# Patient Record
Sex: Female | Born: 1990 | Race: White | Hispanic: No | Marital: Single | Smoking: Never smoker
Health system: Southern US, Community
[De-identification: ages and names within clinical notes are randomized; demographics above are authoritative.]

## PROBLEM LIST (undated history)

## (undated) DIAGNOSIS — Z5189 Encounter for other specified aftercare: Secondary | ICD-10-CM

## (undated) DIAGNOSIS — D649 Anemia, unspecified: Secondary | ICD-10-CM

## (undated) DIAGNOSIS — K9041 Non-celiac gluten sensitivity: Secondary | ICD-10-CM

## (undated) DIAGNOSIS — E039 Hypothyroidism, unspecified: Secondary | ICD-10-CM

## (undated) DIAGNOSIS — N912 Amenorrhea, unspecified: Secondary | ICD-10-CM

## (undated) DIAGNOSIS — Z8659 Personal history of other mental and behavioral disorders: Secondary | ICD-10-CM

## (undated) DIAGNOSIS — Z862 Personal history of diseases of the blood and blood-forming organs and certain disorders involving the immune mechanism: Secondary | ICD-10-CM

## (undated) HISTORY — DX: Encounter for other specified aftercare: Z51.89

## (undated) HISTORY — DX: Amenorrhea, unspecified: N91.2

## (undated) HISTORY — DX: Personal history of diseases of the blood and blood-forming organs and certain disorders involving the immune mechanism: Z86.2

## (undated) HISTORY — DX: Hypothyroidism, unspecified: E03.9

## (undated) HISTORY — DX: Personal history of other mental and behavioral disorders: Z86.59

## (undated) HISTORY — PX: MECKEL DIVERTICULUM EXCISION: SHX314

---
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
## 2010-09-16 LAB — CBC
HCT: 41 % (ref 36.0–46.0)
MCV: 89.8 fL (ref 78.0–100.0)
MCV: 91.2 fL (ref 78.0–100.0)
Platelets: 293 10*3/uL (ref 150–400)
Platelets: 301 10*3/uL (ref 150–400)
Platelets: 374 10*3/uL (ref 150–400)
RDW: 12.6 % (ref 11.5–15.5)
WBC: 10.8 10*3/uL — ABNORMAL HIGH (ref 4.0–10.5)
WBC: 5.8 10*3/uL (ref 4.0–10.5)
WBC: 6.3 10*3/uL (ref 4.0–10.5)

## 2010-09-16 LAB — COMPREHENSIVE METABOLIC PANEL
AST: 18 U/L (ref 0–37)
Albumin: 4.6 g/dL (ref 3.5–5.2)
BUN: 5 mg/dL — ABNORMAL LOW (ref 6–23)
Calcium: 9.6 mg/dL (ref 8.4–10.5)
Creatinine, Ser: 0.67 mg/dL (ref 0.4–1.2)
GFR calc Af Amer: >60 mL/min (ref >60)
Total Protein: 7.5 g/dL (ref 6.0–8.3)

## 2010-09-16 LAB — BASIC METABOLIC PANEL
BUN: 3 mg/dL — ABNORMAL LOW (ref 6–23)
Creatinine, Ser: 0.56 mg/dL (ref 0.4–1.2)
GFR calc Af Amer: >60 mL/min (ref >60)
GFR calc non Af Amer: >60 mL/min (ref >60)
Potassium: 4.3 mEq/L (ref 3.5–5.1)

## 2010-09-16 LAB — DIFFERENTIAL
Basophils Absolute: 0.1 10*3/uL (ref 0.0–0.1)
Eosinophils Absolute: 0.1 10*3/uL (ref 0.0–0.7)
Eosinophils Relative: 0 % (ref 0–5)
Lymphocytes Relative: 15 % (ref 12–46)
Lymphs Abs: 1.7 10*3/uL (ref 0.7–4.0)
Lymphs Abs: 2.3 10*3/uL (ref 0.7–4.0)
Monocytes Absolute: 0.3 10*3/uL (ref 0.1–1.0)
Monocytes Relative: 3 % (ref 3–12)
Neutro Abs: 3.4 10*3/uL (ref 1.7–7.7)
Neutro Abs: 8.8 10*3/uL — ABNORMAL HIGH (ref 1.7–7.7)
Neutrophils Relative %: 54 % (ref 43–77)

## 2010-09-16 LAB — GC/CHLAMYDIA PROBE AMP, GENITAL
Chlamydia, DNA Probe: NEGATIVE
GC Probe Amp, Genital: NEGATIVE

## 2010-09-16 LAB — WET PREP, GENITAL

## 2010-09-16 LAB — URINALYSIS, ROUTINE W REFLEX MICROSCOPIC
Bilirubin Urine: NEGATIVE
Hgb urine dipstick: NEGATIVE
Nitrite: NEGATIVE
Protein, ur: NEGATIVE mg/dL
Specific Gravity, Urine: 1.012 (ref 1.005–1.030)
Urobilinogen, UA: 0.2 mg/dL (ref 0.0–1.0)

## 2010-09-16 LAB — PROTIME-INR
INR: 1.11 (ref 0.00–1.49)
Prothrombin Time: 14.2 seconds (ref 11.6–15.2)

## 2011-04-03 LAB — ETHANOL: Alcohol, Ethyl (B): 76 — ABNORMAL HIGH

## 2017-04-20 ENCOUNTER — Emergency Department (HOSPITAL_COMMUNITY): Payer: No Typology Code available for payment source

## 2017-04-20 ENCOUNTER — Encounter (HOSPITAL_COMMUNITY): Payer: Self-pay | Admitting: *Deleted

## 2017-04-20 ENCOUNTER — Inpatient Hospital Stay (HOSPITAL_COMMUNITY)
Admission: EM | Admit: 2017-04-20 | Discharge: 2017-04-22 | DRG: 442 | Disposition: A | Discharge status: Home / Self Care | Payer: No Typology Code available for payment source | Attending: General Surgery | Admitting: General Surgery

## 2017-04-20 ENCOUNTER — Observation Stay (HOSPITAL_COMMUNITY): Payer: No Typology Code available for payment source

## 2017-04-20 DIAGNOSIS — S36114A Minor laceration of liver, initial encounter: Principal | ICD-10-CM | POA: Diagnosis present

## 2017-04-20 DIAGNOSIS — S2232XA Fracture of one rib, left side, initial encounter for closed fracture: Secondary | ICD-10-CM | POA: Diagnosis present

## 2017-04-20 DIAGNOSIS — T07XXXA Unspecified multiple injuries, initial encounter: Secondary | ICD-10-CM

## 2017-04-20 DIAGNOSIS — K9041 Non-celiac gluten sensitivity: Secondary | ICD-10-CM | POA: Diagnosis present

## 2017-04-20 DIAGNOSIS — S2239XA Fracture of one rib, unspecified side, initial encounter for closed fracture: Secondary | ICD-10-CM

## 2017-04-20 DIAGNOSIS — R1030 Lower abdominal pain, unspecified: Secondary | ICD-10-CM | POA: Diagnosis not present

## 2017-04-20 DIAGNOSIS — S0181XA Laceration without foreign body of other part of head, initial encounter: Secondary | ICD-10-CM | POA: Diagnosis present

## 2017-04-20 DIAGNOSIS — Y9355 Activity, bike riding: Secondary | ICD-10-CM

## 2017-04-20 DIAGNOSIS — R52 Pain, unspecified: Secondary | ICD-10-CM

## 2017-04-20 DIAGNOSIS — S36113A Laceration of liver, unspecified degree, initial encounter: Secondary | ICD-10-CM

## 2017-04-20 DIAGNOSIS — S01311A Laceration without foreign body of right ear, initial encounter: Secondary | ICD-10-CM | POA: Diagnosis present

## 2017-04-20 HISTORY — DX: Non-celiac gluten sensitivity: K90.41

## 2017-04-20 LAB — COMPREHENSIVE METABOLIC PANEL
ALT: 220 U/L — AB (ref 14–54)
AST: 246 U/L — ABNORMAL HIGH (ref 15–41)
Albumin: 4.4 g/dL (ref 3.5–5.0)
Alkaline Phosphatase: 51 U/L (ref 38–126)
Anion gap: 13 (ref 5–15)
BUN: 11 mg/dL (ref 6–20)
CALCIUM: 9.2 mg/dL (ref 8.9–10.3)
CHLORIDE: 97 mmol/L — AB (ref 101–111)
CO2: 22 mmol/L (ref 22–32)
CREATININE: 0.97 mg/dL (ref 0.44–1.00)
Glucose, Bld: 167 mg/dL — ABNORMAL HIGH (ref 65–99)
Potassium: 3.6 mmol/L (ref 3.5–5.1)
SODIUM: 132 mmol/L — AB (ref 135–145)
Total Bilirubin: 0.6 mg/dL (ref 0.3–1.2)
Total Protein: 6.9 g/dL (ref 6.5–8.1)

## 2017-04-20 LAB — CBC
HCT: 32.3 % — ABNORMAL LOW (ref 36.0–46.0)
HEMOGLOBIN: 10.4 g/dL — AB (ref 12.0–15.0)
MCH: 25.2 pg — AB (ref 26.0–34.0)
MCHC: 32.2 g/dL (ref 30.0–36.0)
MCV: 78.2 fL (ref 78.0–100.0)
PLATELETS: 398 10*3/uL (ref 150–400)
RBC: 4.13 MIL/uL (ref 3.87–5.11)
RDW: 16.8 % — ABNORMAL HIGH (ref 11.5–15.5)
WBC: 12.6 10*3/uL — AB (ref 4.0–10.5)

## 2017-04-20 LAB — URINALYSIS, ROUTINE W REFLEX MICROSCOPIC
Bilirubin Urine: NEGATIVE
Glucose, UA: NEGATIVE mg/dL
Hgb urine dipstick: NEGATIVE
KETONES UR: 5 mg/dL — AB
LEUKOCYTES UA: NEGATIVE
NITRITE: NEGATIVE
PROTEIN: NEGATIVE mg/dL
Specific Gravity, Urine: 1.019 (ref 1.005–1.030)
pH: 6 (ref 5.0–8.0)

## 2017-04-20 LAB — I-STAT CHEM 8, ED
BUN: 13 mg/dL (ref 6–20)
CALCIUM ION: 1.09 mmol/L — AB (ref 1.15–1.40)
Chloride: 97 mmol/L — ABNORMAL LOW (ref 101–111)
Creatinine, Ser: 0.9 mg/dL (ref 0.44–1.00)
GLUCOSE: 170 mg/dL — AB (ref 65–99)
HCT: 32 % — ABNORMAL LOW (ref 36.0–46.0)
HEMOGLOBIN: 10.9 g/dL — AB (ref 12.0–15.0)
POTASSIUM: 3.6 mmol/L (ref 3.5–5.1)
Sodium: 134 mmol/L — ABNORMAL LOW (ref 135–145)
TCO2: 23 mmol/L (ref 22–32)

## 2017-04-20 LAB — I-STAT BETA HCG BLOOD, ED (MC, WL, AP ONLY): I-stat hCG, quantitative: <5 m[IU]/mL (ref <5)

## 2017-04-20 LAB — I-STAT CG4 LACTIC ACID, ED: LACTIC ACID, VENOUS: 3.42 mmol/L — AB (ref 0.5–1.9)

## 2017-04-20 LAB — TYPE AND SCREEN
ABO/RH(D): A NEG
ANTIBODY SCREEN: NEGATIVE

## 2017-04-20 LAB — SAMPLE TO BLOOD BANK

## 2017-04-20 LAB — PROTIME-INR
INR: 1.08
PROTHROMBIN TIME: 13.9 s (ref 11.4–15.2)

## 2017-04-20 LAB — ETHANOL

## 2017-04-20 LAB — ABO/RH: ABO/RH(D): A NEG

## 2017-04-20 MED ORDER — ONDANSETRON HCL 4 MG/2ML IJ SOLN: 4.0000 mg | Freq: Four times a day (QID) | INTRAMUSCULAR | Status: DC | PRN

## 2017-04-20 MED ORDER — IOPAMIDOL (ISOVUE-300) INJECTION 61%
INTRAVENOUS | Status: AC
  Administered 2017-04-20: 100 mL
  Filled 2017-04-20: qty 100

## 2017-04-20 MED ORDER — LIDOCAINE-EPINEPHRINE (PF) 2 %-1:200000 IJ SOLN
20.0000 mL | Freq: Once | INTRAMUSCULAR | Status: AC
  Administered 2017-04-20: 20 mL via INTRADERMAL
  Filled 2017-04-20: qty 20

## 2017-04-20 MED ORDER — ONDANSETRON HCL 4 MG/2ML IJ SOLN
4.0000 mg | Freq: Once | INTRAMUSCULAR | Status: AC
  Administered 2017-04-20: 4 mg via INTRAVENOUS
  Filled 2017-04-20: qty 2

## 2017-04-20 MED ORDER — ONDANSETRON 4 MG PO TBDP: 4.0000 mg | ORAL_TABLET | Freq: Four times a day (QID) | ORAL | Status: DC | PRN

## 2017-04-20 MED ORDER — HYDROMORPHONE HCL 1 MG/ML IJ SOLN
1.0000 mg | INTRAMUSCULAR | Status: DC | PRN
  Administered 2017-04-20 – 2017-04-21 (×3): 1 mg via INTRAVENOUS
  Filled 2017-04-20 (×3): qty 1

## 2017-04-20 MED ORDER — LIDOCAINE HCL 2 % EX GEL
1.0000 "application " | Freq: Once | CUTANEOUS | Status: DC
  Filled 2017-04-20: qty 20

## 2017-04-20 MED ORDER — SODIUM CHLORIDE 0.9 % IV SOLN
INTRAVENOUS | Status: DC
  Administered 2017-04-20: 125 mL/h via INTRAVENOUS

## 2017-04-20 MED ORDER — DEXTROSE-NACL 5-0.9 % IV SOLN
INTRAVENOUS | Status: DC
  Administered 2017-04-21: 01:00:00 via INTRAVENOUS

## 2017-04-20 MED ORDER — FENTANYL CITRATE (PF) 100 MCG/2ML IJ SOLN
INTRAMUSCULAR | Status: AC | PRN
  Administered 2017-04-20: 50 ug via INTRAVENOUS

## 2017-04-20 MED ORDER — TETANUS-DIPHTH-ACELL PERTUSSIS 5-2.5-18.5 LF-MCG/0.5 IM SUSP
0.5000 mL | Freq: Once | INTRAMUSCULAR | Status: AC
  Administered 2017-04-20: 0.5 mL via INTRAMUSCULAR
  Filled 2017-04-20: qty 0.5

## 2017-04-20 MED ORDER — ENOXAPARIN SODIUM 40 MG/0.4ML ~~LOC~~ SOLN
40.0000 mg | SUBCUTANEOUS | Status: DC
  Administered 2017-04-20 – 2017-04-21 (×2): 40 mg via SUBCUTANEOUS
  Filled 2017-04-20 (×2): qty 0.4

## 2017-04-20 MED ORDER — CEFAZOLIN SODIUM-DEXTROSE 1-4 GM/50ML-% IV SOLN
1.0000 g | Freq: Once | INTRAVENOUS | Status: AC
  Administered 2017-04-20: 1 g via INTRAVENOUS
  Filled 2017-04-20: qty 50

## 2017-04-20 MED ORDER — FENTANYL CITRATE (PF) 100 MCG/2ML IJ SOLN
INTRAMUSCULAR | Status: AC
  Filled 2017-04-20: qty 2

## 2017-04-20 MED ORDER — SODIUM CHLORIDE 0.9 % IV BOLUS (SEPSIS)
1000.0000 mL | Freq: Once | INTRAVENOUS | Status: AC
  Administered 2017-04-20: 1000 mL via INTRAVENOUS

## 2017-04-20 MED ORDER — SODIUM CHLORIDE 0.9 % IV SOLN
INTRAVENOUS | Status: AC | PRN
  Administered 2017-04-20: 1000 mL via INTRAVENOUS

## 2017-04-20 MED ORDER — BACITRACIN ZINC 500 UNIT/GM EX OINT
1.0000 "application " | TOPICAL_OINTMENT | Freq: Two times a day (BID) | CUTANEOUS | Status: DC
  Administered 2017-04-20 – 2017-04-21 (×3): 1 via TOPICAL
  Filled 2017-04-20 (×2): qty 28.35

## 2017-04-20 NOTE — Progress Notes (Signed)
Received patient from ED accompanied by father. Patient AOx4, VS stable with slightly low BP at 108/56, O2Sat at 96% on RA, pain at 4/10, noted multiple abrasion on bilateral arms, knees and head.  Applied ice pack on right head and right hand, cleanse abrasion and applied bacitracin per order.  Patient on continuous pulse oximetry and telemetry.  Patient oriented to room, bed controls and call light.  [facility] mother arrived. Patient now resting on bed with both eyes close. Mom is at bedside and will be spending the night with the patient.  Will continue to monitor.
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
## 2017-04-20 NOTE — ED Notes (Signed)
Wounds debrieded  by Dr Lazarus SalinesWolicki, bacitracin placed and abrasions left open to air.
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
## 2017-04-20 NOTE — ED Notes (Signed)
Dr. Ramirez at bedside  
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
## 2017-04-20 NOTE — Progress Notes (Signed)
Orthopedic Tech Progress Note Patient Details:  Derry SkillHilary Redfield [birth year] [id number]  Patient ID: Derry SkillHilary Bossier, female   DOB: [birth year], 26 y.o.   MRN: [id number]   Nikki DomCrawford, Ensley Blas 04/20/2017, 12:53 PM Made level 2 trauma visit
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
## 2017-04-20 NOTE — ED Notes (Signed)
Dr.Wolicki at bedside. 

## 2017-04-21 DIAGNOSIS — R1030 Lower abdominal pain, unspecified: Secondary | ICD-10-CM | POA: Diagnosis present

## 2017-04-21 DIAGNOSIS — S36114A Minor laceration of liver, initial encounter: Secondary | ICD-10-CM | POA: Diagnosis present

## 2017-04-21 DIAGNOSIS — S01311A Laceration without foreign body of right ear, initial encounter: Secondary | ICD-10-CM | POA: Diagnosis present

## 2017-04-21 DIAGNOSIS — S2232XA Fracture of one rib, left side, initial encounter for closed fracture: Secondary | ICD-10-CM | POA: Diagnosis present

## 2017-04-21 DIAGNOSIS — K9041 Non-celiac gluten sensitivity: Secondary | ICD-10-CM | POA: Diagnosis present

## 2017-04-21 DIAGNOSIS — Y9355 Activity, bike riding: Secondary | ICD-10-CM | POA: Diagnosis not present

## 2017-04-21 DIAGNOSIS — S0181XA Laceration without foreign body of other part of head, initial encounter: Secondary | ICD-10-CM | POA: Diagnosis present

## 2017-04-21 LAB — CBC
HEMATOCRIT: 29.3 % — AB (ref 36.0–46.0)
Hemoglobin: 9.4 g/dL — ABNORMAL LOW (ref 12.0–15.0)
MCH: 25.6 pg — AB (ref 26.0–34.0)
MCHC: 32.1 g/dL (ref 30.0–36.0)
MCV: 79.8 fL (ref 78.0–100.0)
PLATELETS: 290 10*3/uL (ref 150–400)
RBC: 3.67 MIL/uL — ABNORMAL LOW (ref 3.87–5.11)
RDW: 17.7 % — AB (ref 11.5–15.5)
WBC: 7.3 10*3/uL (ref 4.0–10.5)

## 2017-04-21 MED ORDER — ACETAMINOPHEN 325 MG PO TABS: 650.0000 mg | ORAL_TABLET | Freq: Four times a day (QID) | ORAL | Status: DC | PRN

## 2017-04-21 MED ORDER — OXYCODONE HCL 5 MG PO TABS
5.0000 mg | ORAL_TABLET | Freq: Four times a day (QID) | ORAL | Status: DC | PRN
  Administered 2017-04-21: 5 mg via ORAL
  Filled 2017-04-21: qty 1
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
## 2017-04-22 LAB — CBC
HCT: 29.4 % — ABNORMAL LOW (ref 36.0–46.0)
Hemoglobin: 9.3 g/dL — ABNORMAL LOW (ref 12.0–15.0)
MCH: 25.5 pg — ABNORMAL LOW (ref 26.0–34.0)
MCHC: 31.6 g/dL (ref 30.0–36.0)
MCV: 80.5 fL (ref 78.0–100.0)
PLATELETS: 267 10*3/uL (ref 150–400)
RBC: 3.65 MIL/uL — ABNORMAL LOW (ref 3.87–5.11)
RDW: 17.4 % — AB (ref 11.5–15.5)
WBC: 6.2 10*3/uL (ref 4.0–10.5)

## 2017-04-22 MED ORDER — OXYCODONE HCL 5 MG PO TABS: 5.0000 mg | ORAL_TABLET | Freq: Four times a day (QID) | ORAL | 0 refills | Status: DC | PRN

## 2017-04-22 MED ORDER — BACITRACIN ZINC 500 UNIT/GM EX OINT: 1.0000 "application " | TOPICAL_OINTMENT | Freq: Two times a day (BID) | CUTANEOUS | 0 refills | Status: DC
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
## 2017-04-22 NOTE — Discharge Instructions (Signed)
OK to shower  Clean wounds twice daily (shower counts as one) with Q-tip and water, then apply a thin coat of antibiotic ointment to any area which is scabby or crusty. Do not get fresh scars sunburned for 6 mos. OK to apply Mederma over the counter or Vitamin E oil to healed scars to help with scarring Sleep elevated 3-4 nights Ice pack as desired x 24 hrs.   Recheck my office 8 days for suture removal [phone] Call for signs of infection, same number.    Liver Laceration A liver laceration is a tear or a cut in the liver. The liver is an organ that is involved in many important bodily functions. Sometimes, a liver laceration can be a very serious injury. It can cause a lot of bleeding, and surgery may be needed. Other times, a liver laceration may be minor, and bed rest may be all that is needed. Either way, treatment in a hospital is almost always required. Liver lacerations are categorized in grades from 1 to 5. Low numbers identify lacerations that are less severe than lacerations with high numbers.  Grade 1: This is a tear in the outer lining of the liver. It is less than  inch (1 cm) deep.  Grade 2: This is a tear that is about  inch to 1 inch (1 to 3 cm) deep. It is less than 4 inches (10 cm) long.  Grade 3: This is a tear that is slightly more than 1 inch (3 cm) deep.  Grades 4 and 5: These lacerations are very deep. They affect a large part of the liver.  What are the causes? This condition may be caused by:  A forceful hit to the area around the liver (blunt trauma), such as in a car crash. Blunt trauma can tear the liver even though it does not break the skin.  An injury in which an object goes through the skin and into the liver (penetrating injury), such as a stab or gunshot wound.  What are the signs or symptoms? Common symptoms of this condition include:  A swollen and firm abdomen.  Pain in the abdomen.  Tenderness when pressing on the right side of the  abdomen.  Other symptoms include:  Bleeding from a penetrating wound.  Bruises on the abdomen.  A fast heartbeat.  Taking quick breaths.  Feeling weak and dizzy.  How is this diagnosed? To diagnose this condition, your health care provider will do a physical exam and ask about any injuries to the right side of your abdomen. You may have various tests, such as:  Blood tests. Your blood may be tested every few hours. This will show whether you are losing blood.  CT scan. This test is done to check for laceration or bleeding.  Laparoscopy. This involves placing a small camera into the abdomen and looking directly at the surface of the liver.  How is this treated? Treatment depends on how deep the laceration is and how much bleeding you have. Treatment options include:  Monitoring and bed rest at the hospital. You will have tests often.  Receiving donated blood through an IV tube (transfusion) to replace blood that you have lost. You may need several transfusions.  Surgery to pack gauze pads or special material around the laceration to help it heal or to repair the laceration.  Follow these instructions at home:  Take over-the-counter and prescription medicines only as told by your health care provider. Do not take any other medicines  unless you ask your health care provider about them first.  Do not drive or use heavy machinery while taking prescription pain medicines.  Rest and limit your activity as told by your health care provider. It may be several months before you can return to your usual routine. Do not participate in activities that involve physical contact or require extra energy until your health care provider approves.  Keep all follow-up visits as told by your health care provider. This is important. Contact a health care provider if:  Your abdominal pain does not go away.  You feel more weak and tired than usual. Get help right away if:  Your abdominal pain  gets worse.  You have a cut on your skin that: ? Has more redness, swelling, or pain around it. ? Has more fluid or blood coming from it. ? Feels warm to the touch. ? Has pus or a bad smell coming from it.  You feel dizzy or very weak.  You have trouble breathing.  You have a fever. This information is not intended to replace advice given to you by your health care provider. Make sure you discuss any questions you have with your health care provider. Document Released: 07/13/2010 Document Revised: 01/26/2016 Document Reviewed: 01/26/2016 Elsevier Interactive Patient Education  2018 [employer]    Rib Fracture A rib fracture is a break or crack in one of the bones of the ribs. The ribs are a group of long, curved bones that wrap around your chest and attach to your spine. They protect your lungs and other organs in the chest cavity. A broken or cracked rib is often painful, but most do not cause other problems. Most rib fractures heal on their own over time. However, rib fractures can be more serious if multiple ribs are broken or if broken ribs move out of place and push against other structures. What are the causes?  A direct blow to the chest. For example, this could happen during contact sports, a car accident, or a fall against a hard object.  Repetitive movements with high force, such as pitching a baseball or having severe coughing spells. What are the signs or symptoms?  Pain when you breathe in or cough.  Pain when someone presses on the injured area. How is this diagnosed? Your caregiver will perform a physical exam. Various imaging tests may be ordered to confirm the diagnosis and to look for related injuries. These tests may include a chest X-ray, computed tomography (CT), magnetic resonance imaging (MRI), or a bone scan. How is this treated? Rib fractures usually heal on their own in 1-3 months. The longer healing period is often associated with a continued cough or  other aggravating activities. During the healing period, pain control is very important. Medication is usually given to control pain. Hospitalization or surgery may be needed for more severe injuries, such as those in which multiple ribs are broken or the ribs have moved out of place. Follow these instructions at home:  Avoid strenuous activity and any activities or movements that cause pain. Be careful during activities and avoid bumping the injured rib.  Gradually increase activity as directed by your caregiver.  Only take over-the-counter or prescription medications as directed by your caregiver. Do not take other medications without asking your caregiver first.  Apply ice to the injured area for the first 1-2 days after you have been treated or as directed by your caregiver. Applying ice helps to reduce inflammation and pain. ?  Put ice in a plastic bag. ? Place a towel between your skin and the bag. ? Leave the ice on for 15-20 minutes at a time, every 2 hours while you are awake.  Perform deep breathing as directed by your caregiver. This will help prevent pneumonia, which is a common complication of a broken rib. Your caregiver may instruct you to: ? Take deep breaths several times a day. ? Try to cough several times a day, holding a pillow against the injured area. ? Use a device called an incentive spirometer to practice deep breathing several times a day.  Drink enough fluids to keep your urine clear or pale yellow. This will help you avoid constipation.  Do not wear a rib belt or binder. These restrict breathing, which can lead to pneumonia. Get help right away if:  You have a fever.  You have difficulty breathing or shortness of breath.  You develop a continual cough, or you cough up thick or bloody sputum.  You feel sick to your stomach (nausea), throw up (vomit), or have abdominal pain.  You have worsening pain not controlled with medications. This information is not  intended to replace advice given to you by your health care provider. Make sure you discuss any questions you have with your health care provider. Document Released: 06/10/2005 Document Revised: 11/16/2015 Document Reviewed: 08/12/2012 [employer] Interactive Patient Education  [employer].
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
## 2017-07-16 DIAGNOSIS — F4323 Adjustment disorder with mixed anxiety and depressed mood: Secondary | ICD-10-CM | POA: Diagnosis not present

## 2017-08-04 DIAGNOSIS — F4323 Adjustment disorder with mixed anxiety and depressed mood: Secondary | ICD-10-CM | POA: Diagnosis not present

## 2017-08-27 DIAGNOSIS — F4323 Adjustment disorder with mixed anxiety and depressed mood: Secondary | ICD-10-CM | POA: Diagnosis not present

## 2017-11-11 ENCOUNTER — Encounter: Payer: Self-pay | Admitting: Obstetrics and Gynecology

## 2017-12-22 ENCOUNTER — Other Ambulatory Visit: Payer: Self-pay

## 2017-12-22 ENCOUNTER — Encounter: Payer: Self-pay | Admitting: Obstetrics and Gynecology

## 2017-12-22 ENCOUNTER — Ambulatory Visit: Payer: BLUE CROSS/BLUE SHIELD | Admitting: Obstetrics and Gynecology

## 2017-12-22 ENCOUNTER — Other Ambulatory Visit (HOSPITAL_COMMUNITY)
Admission: RE | Admit: 2017-12-22 | Discharge: 2017-12-22 | Disposition: A | Discharge status: Home / Self Care | Payer: BLUE CROSS/BLUE SHIELD | Source: Ambulatory Visit | Attending: Obstetrics and Gynecology | Admitting: Obstetrics and Gynecology

## 2017-12-22 VITALS — BP 118/72 | HR 60 | Resp 16 | Ht 65.0 in | Wt 124.2 lb

## 2017-12-22 DIAGNOSIS — E559 Vitamin D deficiency, unspecified: Secondary | ICD-10-CM

## 2017-12-22 DIAGNOSIS — Z862 Personal history of diseases of the blood and blood-forming organs and certain disorders involving the immune mechanism: Secondary | ICD-10-CM

## 2017-12-22 DIAGNOSIS — Z0000 Encounter for general adult medical examination without abnormal findings: Secondary | ICD-10-CM

## 2017-12-22 DIAGNOSIS — Z124 Encounter for screening for malignant neoplasm of cervix: Secondary | ICD-10-CM | POA: Insufficient documentation

## 2017-12-22 DIAGNOSIS — Z01419 Encounter for gynecological examination (general) (routine) without abnormal findings: Secondary | ICD-10-CM | POA: Diagnosis not present

## 2017-12-22 DIAGNOSIS — N911 Secondary amenorrhea: Secondary | ICD-10-CM

## 2017-12-22 MED ORDER — MEDROXYPROGESTERONE ACETATE 5 MG PO TABS: 5.0000 mg | ORAL_TABLET | Freq: Every day | ORAL | 0 refills | Status: DC

## 2017-12-22 NOTE — Progress Notes (Signed)
[age] y.o. No obstetric history on file. SingleCaucasianF here for annual exam.   Menarche at 17-18, she had regular cycles for a few years. Stopped being regular a couple of years ago. Started skipping her cycles, progressively got further apart. No cycle in the last year. Not sexually active in 2 years.  She has lost 20 lbs in the last few years, the irregular cycles started prior to that. She is exercising 4-5 x a week, 30-60 minutes. Max of running 5 miles. No galactorrhea. No headaches, no visual changes. Skin is always dry, no other thyroid complaints. No h/o anorexia or bulimia.  No hot flashes or night sweats. Some vaginal dryness.  LMP: patient has not had a menses in 1 year. Previously has taken progesterone with no menses following. Not sexually active in 16 months.      She was hospitalized for a GI bleed around 2016, was from a severe gluten allergy.   Sexually active: No.  The current method of family planning is none.    Exercising: Yes.    running, weight lifting Smoker:  no  Health Maintenance: Pap:  Last pap per patient 2017 WNL per patient History of abnormal Pap:  no TDaP: 04/2017 per patient Gardasil: No, discussed.    reports that she has never smoked. She has never used smokeless tobacco. She reports that she does not drink alcohol or use drugs. She works at a coffee shop.   Past Medical History:  Diagnosis Date  . Amenorrhea   . Blood transfusion without reported diagnosis   . Gluten intolerance   . History of anemia    resolved with dietary changes    Past Surgical History:  Procedure Laterality Date  . MECKEL DIVERTICULUM EXCISION  at 19   started as laparoscopy, ended in laparotomy    No current outpatient medications on file.   No current facility-administered medications for this visit.     Family History  Problem Relation Age of Onset  . Breast cancer Paternal Grandmother   . Ovarian cancer Maternal Grandmother     Review of Systems   Constitutional: Negative.   HENT: Negative.   Eyes: Negative.   Respiratory: Negative.   Cardiovascular: Negative.   Gastrointestinal: Negative.   Endocrine: Negative.   Genitourinary:       Amenorrhea x 1 year  Musculoskeletal: Negative.   Skin: Negative.   Allergic/Immunologic: Negative.   Neurological: Negative.   Hematological: Negative.   Psychiatric/Behavioral: Negative.     Exam:   BP 118/72 (BP Location: Right Arm, Patient Position: Sitting)   Pulse 60   Resp 16   Ht 5\' 5"  (1.651 m)   Wt 124 lb 3.2 oz (56.3 kg)   BMI 20.67 kg/m   Weight change: @WEIGHTCHANGE @ Height:   Height: 5\' 5"  (165.1 cm)  Ht Readings from Last 3 Encounters:  12/22/17 5\' 5"  (1.651 m)  04/20/17 5\' 6"  (1.676 m)    General appearance: alert, cooperative and appears stated age. Pale Head: Normocephalic, without obvious abnormality, atraumatic Neck: no adenopathy, supple, symmetrical, trachea midline and thyroid normal to inspection and palpation Lungs: clear to auscultation bilaterally Cardiovascular: regular rate and rhythm Breasts: normal appearance, no masses or tenderness Abdomen: soft, non-tender; non distended,  no masses,  no organomegaly Extremities: extremities normal, atraumatic, no cyanosis or edema Skin: Skin color, texture, turgor normal. No rashes or lesions Lymph nodes: Cervical, supraclavicular, and axillary nodes normal. No abnormal inguinal nodes palpated Neurologic: Grossly normal   Pelvic: External genitalia:  no lesions              Urethra:  normal appearing urethra with no masses, tenderness or lesions              Bartholins and Skenes: normal                 Vagina: normal appearing vagina with normal color and discharge, no lesions              Cervix: no cervical motion tenderness and no lesions               Bimanual Exam:  Uterus:  normal size, contour, position, consistency, mobility, non-tender              Adnexa: no mass, fullness, tenderness                Rectovaginal: Confirms               Anus:  normal sphincter tone, no lesions  Chaperone was present for exam.  A:  Well Woman with normal exam  Secondary amenorrhea, not sexually active.  Dietary restrictions  H/O anemia    P:   Pap with reflex hpv  Declines STD testing  Provera now, call with or without a cycle  Screening labs  TSH, prolactin, FSH, estradiol  CBC, ferritin, B12, folate  Vit D   If still anemic will refer to hematology

## 2017-12-22 NOTE — Patient Instructions (Signed)
Take the provera for 5 days. Please call the office with or without a cycle (can take up to 2 weeks).  EXERCISE AND DIET:  We recommended that you start or continue a regular exercise program for good health. Regular exercise means any activity that makes your heart beat faster and makes you sweat.  We recommend exercising at least 30 minutes per day at least 3 days a week, preferably 4 or 5.  We also recommend a diet low in fat and sugar.  Inactivity, poor dietary choices and obesity can cause diabetes, heart attack, stroke, and kidney damage, among others.    ALCOHOL AND SMOKING:  Women should limit their alcohol intake to no more than 7 drinks/beers/glasses of wine (combined, not each!) per week. Moderation of alcohol intake to this level decreases your risk of breast cancer and liver damage. And of course, no recreational drugs are part of a healthy lifestyle.  And absolutely no smoking or even second hand smoke. Most people know smoking can cause heart and lung diseases, but did you know it also contributes to weakening of your bones? Aging of your skin?  Yellowing of your teeth and nails?  CALCIUM AND VITAMIN D:  Adequate intake of calcium and Vitamin D are recommended.  The recommendations for exact amounts of these supplements seem to change often, but generally speaking 600 mg of calcium (either carbonate or citrate) and 800 units of Vitamin D per day seems prudent. Certain women may benefit from higher intake of Vitamin D.  If you are among these women, your doctor will have told you during your visit.    PAP SMEARS:  Pap smears, to check for cervical cancer or precancers,  have traditionally been done yearly, although recent scientific advances have shown that most women can have pap smears less often.  However, every woman still should have a physical exam from her gynecologist every year. It will include a breast check, inspection of the vulva and vagina to check for abnormal growths or skin  changes, a visual exam of the cervix, and then an exam to evaluate the size and shape of the uterus and ovaries.  And after 27 years of age, a rectal exam is indicated to check for rectal cancers. We will also provide age appropriate advice regarding health maintenance, like when you should have certain vaccines, screening for sexually transmitted diseases, bone density testing, colonoscopy, mammograms, etc.   MAMMOGRAMS:  All women over 54 years old should have a yearly mammogram. Many facilities now offer a "3D" mammogram, which may cost around $50 extra out of pocket. If possible,  we recommend you accept the option to have the 3D mammogram performed.  It both reduces the number of women who will be called back for extra views which then turn out to be normal, and it is better than the routine mammogram at detecting truly abnormal areas.    COLONOSCOPY:  Colonoscopy to screen for colon cancer is recommended for all women at age 45.  We know, you hate the idea of the prep.  We agree, BUT, having colon cancer and not knowing it is worse!!  Colon cancer so often starts as a polyp that can be seen and removed at colonscopy, which can quite literally save your life!  And if your first colonoscopy is normal and you have no family history of colon cancer, most women don't have to have it again for 10 years.  Once every ten years, you can do something that  may end up saving your life, right?  We will be happy to help you get it scheduled when you are ready.  Be sure to check your insurance coverage so you understand how much it will cost.  It may be covered as a preventative service at no cost, but you should check your particular policy.      Breast Self-Awareness Breast self-awareness means being familiar with how your breasts look and feel. It involves checking your breasts regularly and reporting any changes to your health care provider. Practicing breast self-awareness is important. A change in your  breasts can be a sign of a serious medical problem. Being familiar with how your breasts look and feel allows you to find any problems early, when treatment is more likely to be successful. All women should practice breast self-awareness, including women who have had breast implants. How to do a breast self-exam One way to learn what is normal for your breasts and whether your breasts are changing is to do a breast self-exam. To do a breast self-exam: Look for Changes  1. Remove all the clothing above your waist. 2. Stand in front of a mirror in a room with good lighting. 3. Put your hands on your hips. 4. Push your hands firmly downward. 5. Compare your breasts in the mirror. Look for differences between them (asymmetry), such as: ? Differences in shape. ? Differences in size. ? Puckers, dips, and bumps in one breast and not the other. 6. Look at each breast for changes in your skin, such as: ? Redness. ? Scaly areas. 7. Look for changes in your nipples, such as: ? Discharge. ? Bleeding. ? Dimpling. ? Redness. ? A change in position. Feel for Changes  Carefully feel your breasts for lumps and changes. It is best to do this while lying on your back on the floor and again while sitting or standing in the shower or tub with soapy water on your skin. Feel each breast in the following way:  Place the arm on the side of the breast you are examining above your head.  Feel your breast with the other hand.  Start in the nipple area and make  inch (2 cm) overlapping circles to feel your breast. Use the pads of your three middle fingers to do this. Apply light pressure, then medium pressure, then firm pressure. The light pressure will allow you to feel the tissue closest to the skin. The medium pressure will allow you to feel the tissue that is a little deeper. The firm pressure will allow you to feel the tissue close to the ribs.  Continue the overlapping circles, moving downward over the  breast until you feel your ribs below your breast.  Move one finger-width toward the center of the body. Continue to use the  inch (2 cm) overlapping circles to feel your breast as you move slowly up toward your collarbone.  Continue the up and down exam using all three pressures until you reach your armpit.  Write Down What You Find  Write down what is normal for each breast and any changes that you find. Keep a written record with breast changes or normal findings for each breast. By writing this information down, you do not need to depend only on memory for size, tenderness, or location. Write down where you are in your menstrual cycle, if you are still menstruating. If you are having trouble noticing differences in your breasts, do not get discouraged. With time you will  become more familiar with the variations in your breasts and more comfortable with the exam. How often should I examine my breasts? Examine your breasts every month. If you are breastfeeding, the best time to examine your breasts is after a feeding or after using a breast pump. If you menstruate, the best time to examine your breasts is 5-7 days after your period is over. During your period, your breasts are lumpier, and it may be more difficult to notice changes. When should I see my health care provider? See your health care provider if you notice:  A change in shape or size of your breasts or nipples.  A change in the skin of your breast or nipples, such as a reddened or scaly area.  Unusual discharge from your nipples.  A lump or thick area that was not there before.  Pain in your breasts.  Anything that concerns you.  This information is not intended to replace advice given to you by your health care provider. Make sure you discuss any questions you have with your health care provider. Document Released: 06/10/2005 Document Revised: 11/16/2015 Document Reviewed: 04/30/2015 Elsevier Interactive Patient Education   [employer]2018 [employer]

## 2017-12-23 LAB — COMPREHENSIVE METABOLIC PANEL
ALK PHOS: 37 IU/L — AB (ref 39–117)
ALT: 14 IU/L (ref 0–32)
AST: 18 IU/L (ref 0–40)
Albumin/Globulin Ratio: 2.8 — ABNORMAL HIGH (ref 1.2–2.2)
Albumin: 5 g/dL (ref 3.5–5.5)
BILIRUBIN TOTAL: 0.2 mg/dL (ref 0.0–1.2)
BUN / CREAT RATIO: 13 (ref 9–23)
BUN: 9 mg/dL (ref 6–20)
CHLORIDE: 90 mmol/L — AB (ref 96–106)
CO2: 23 mmol/L (ref 20–29)
CREATININE: 0.71 mg/dL (ref 0.57–1.00)
Calcium: 9.3 mg/dL (ref 8.7–10.2)
GFR calc Af Amer: 135 mL/min/{1.73_m2} (ref >59)
GFR calc non Af Amer: 117 mL/min/{1.73_m2} (ref >59)
Globulin, Total: 1.8 g/dL (ref 1.5–4.5)
Glucose: 86 mg/dL (ref 65–99)
Potassium: 4.1 mmol/L (ref 3.5–5.2)
Sodium: 127 mmol/L — ABNORMAL LOW (ref 134–144)
Total Protein: 6.8 g/dL (ref 6.0–8.5)

## 2017-12-23 LAB — PROLACTIN: PROLACTIN: 16.4 ng/mL (ref 4.8–23.3)

## 2017-12-23 LAB — CBC
Hematocrit: 31.8 % — ABNORMAL LOW (ref 34.0–46.6)
Hemoglobin: 11 g/dL — ABNORMAL LOW (ref 11.1–15.9)
MCH: 30.7 pg (ref 26.6–33.0)
MCHC: 34.6 g/dL (ref 31.5–35.7)
MCV: 89 fL (ref 79–97)
PLATELETS: 318 10*3/uL (ref 150–450)
RBC: 3.58 x10E6/uL — ABNORMAL LOW (ref 3.77–5.28)
RDW: 13.6 % (ref 12.3–15.4)
WBC: 6.1 10*3/uL (ref 3.4–10.8)

## 2017-12-23 LAB — FERRITIN: Ferritin: 21 ng/mL (ref 15–150)

## 2017-12-23 LAB — CYTOLOGY - PAP: DIAGNOSIS: NEGATIVE

## 2017-12-23 LAB — ESTRADIOL: Estradiol: <5 pg/mL

## 2017-12-23 LAB — VITAMIN D 25 HYDROXY (VIT D DEFICIENCY, FRACTURES): VIT D 25 HYDROXY: 15 ng/mL — AB (ref 30.0–100.0)

## 2017-12-23 LAB — FOLLICLE STIMULATING HORMONE: FSH: 4.1 m[IU]/mL

## 2017-12-23 LAB — B12 AND FOLATE PANEL
FOLATE: 12.5 ng/mL (ref >3.0)
VITAMIN B 12: 522 pg/mL (ref 232–1245)

## 2017-12-23 LAB — LIPID PANEL
CHOLESTEROL TOTAL: 201 mg/dL — AB (ref 100–199)
Chol/HDL Ratio: 2.3 ratio (ref 0.0–4.4)
HDL: 88 mg/dL (ref >39)
LDL Calculated: 104 mg/dL — ABNORMAL HIGH (ref 0–99)
TRIGLYCERIDES: 45 mg/dL (ref 0–149)
VLDL CHOLESTEROL CAL: 9 mg/dL (ref 5–40)

## 2017-12-23 LAB — TSH: TSH: 6.27 u[IU]/mL — ABNORMAL HIGH (ref 0.450–4.500)

## 2017-12-26 ENCOUNTER — Telehealth: Payer: Self-pay

## 2017-12-26 DIAGNOSIS — R7989 Other specified abnormal findings of blood chemistry: Secondary | ICD-10-CM

## 2017-12-26 NOTE — Telephone Encounter (Signed)
-----   Message from Romualdo BolkJill Evelyn Jertson, MD sent at 12/24/2017  5:33 PM EDT ----- Please inform the patient that her vit d is low and have her start taking 2,000 IU of vit D3 daily. She should have a f/u vit D level in 3 months. Her TSH is elevated, will add on a free T3 and FT4 and make recommendations after those labs have returned. 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  Her hormone levels (FSH and estradiol) are low. This is most c/w hypothalamic amenorrhea She is very mildly anemic with low normal iron stores, normal B12 and Folate. I would recommend that she take one iron tablet a day, slow fe or ferrex 150 mg are options. Please check another CBC and ferritin with her f/u vit d in 3 months Her sodium is low, I don't know why. It is likely a chronic issue. Symptoms can include headache, fatigue, nausea, confusion. See if she is having any of these symptoms. She needs to see a primary MD to manage and follow her low sodium, please try and get her an appointment in the next week.  If her FT4 and FT3 are normal, she should have a brain MRI to rule out a tumor of the hypothalamus and pituitary secondary to her low FSH, estradiol and low sodium, If the f/u thyroid testing is normal please order a MRI of the [place], with and without contrast.  02 recall for her pap

## 2017-12-26 NOTE — Telephone Encounter (Signed)
Left message to call Kaitlyn at [phone]. 

## 2017-12-26 NOTE — Telephone Encounter (Signed)
Return call from patient. Advised of results and instructions thus far.  Discussed Vit D3 and OTC iron supplement ( to take with with vit c instead of calcium.) Patient is at work and is unable to schedule follow-up lab appointment at this time. Will await remaining labs and next step instructions.

## 2017-12-29 NOTE — Telephone Encounter (Signed)
Spoke with patient. Review results again. Patient wants to let Dr.Jertson know [facility]. Per review of chart this was a CT scan not an MRI. Advised patient once additional testing results have returned we will return call with additional recommendations. Patient is agreeable.

## 2017-12-30 NOTE — Telephone Encounter (Signed)
-----   Message from Romualdo BolkJill Evelyn Jertson, MD sent at 12/29/2017  5:06 PM EDT ----- Please inform the patient that her FT3 is slightly low and refer her to see Endocrinology. I think she should see Endocrinology prior to a MRI, hypothyroidism can cause amenorrhea (although hers is very mild, lets get the Endocrinologists opinion)

## 2017-12-30 NOTE — Telephone Encounter (Signed)
Spoke with patient. Results given. Patient verbalizes understanding. Referral placed to [facility]. Patient is aware she will be contacted to schedule an appointment directly. Encounter closed.

## 2017-12-30 NOTE — Telephone Encounter (Signed)
Left message to call Kaitlyn at [phone]. 

## 2018-01-06 ENCOUNTER — Telehealth: Payer: Self-pay | Admitting: Obstetrics and Gynecology

## 2018-01-06 NOTE — Telephone Encounter (Signed)
Patient is still waiting to hear from [facility]. Patient is asking if she should contact [facility] to schedule the appointment? Patient was seen 12/22/2017 and is ready to schedule this appointment.

## 2018-01-06 NOTE — Telephone Encounter (Signed)
Call placed to patient in reference to a referral. °

## 2018-01-10 LAB — SPECIMEN STATUS REPORT

## 2018-01-10 LAB — T4, FREE: Free T4: 0.98 ng/dL (ref 0.82–1.77)

## 2018-01-10 LAB — T3, FREE: T3, Free: 1.8 pg/mL — ABNORMAL LOW (ref 2.0–4.4)

## 2018-01-28 ENCOUNTER — Telehealth: Payer: Self-pay | Admitting: Obstetrics and Gynecology

## 2018-01-28 NOTE — Telephone Encounter (Signed)
Spoke with patient. Requesting to start Garadsil vaccine series discussed at [facility] on 12/22/17.   LMP: no menses in over 8884yr. No bleeding after Provera. Not SA in last 17 months.  Will see endocrinology on 02/27/18 for elevated TSH and low T3.   Advised patient Gardasil typically scheduled while on menses when no contraceptive. Will need to review with Dr. Oscar LaJertson to advise on scheduling and return call. Patient agreeable and states she can wait until after evaluation with endocrinology if needed.   Dr. Oscar LaJertson -please advise.

## 2018-01-28 NOTE — Telephone Encounter (Signed)
If the patient isn't sexually active she can have the Gardasil at any time in her cycle.

## 2018-01-28 NOTE — Telephone Encounter (Signed)
Patient requesting to get Gardasil vaccine.

## 2018-01-29 NOTE — Telephone Encounter (Signed)
Left message to call Ellar Hakala at [phone].  

## 2018-01-29 NOTE — Telephone Encounter (Signed)
Spoke with patient. 1st Gardasil vaccine scheduled for 02/04/18 at 9:30am.   Advised patient to return call to office should she become SA or any chance of pregnancy prior to appt.  Patient verbalizes understanding.   Routing to provider for final review. Patient is agreeable to disposition. Will close encounter.

## 2018-02-02 NOTE — Progress Notes (Signed)
Patient in today for 1st Gardasil injection.   Contraception: abstinence  LMP: patient has not had a menses in 1 year. Previously has taken progesterone with no menses following. Not sexually active in 16 months Last AEX: 12/22/2017 with Dr Oscar LaJertson  Injection given in left deltoid. Patient tolerated shot well.   Patient informed next injection due in about 2 months.  Advised patient, if not on birth control, to return for next injection with cycle.   Routed to provider for final review.  Encounter closed.

## 2018-02-04 ENCOUNTER — Ambulatory Visit (INDEPENDENT_AMBULATORY_CARE_PROVIDER_SITE_OTHER): Payer: BLUE CROSS/BLUE SHIELD

## 2018-02-04 VITALS — BP 116/68 | Wt 124.0 lb

## 2018-02-04 DIAGNOSIS — Z23 Encounter for immunization: Secondary | ICD-10-CM | POA: Diagnosis not present

## 2018-02-27 ENCOUNTER — Encounter: Payer: Self-pay | Admitting: Internal Medicine

## 2018-02-27 ENCOUNTER — Ambulatory Visit: Payer: BLUE CROSS/BLUE SHIELD | Admitting: Internal Medicine

## 2018-02-27 VITALS — BP 126/78 | HR 99 | Temp 98.2°F | Ht 65.0 in | Wt 123.0 lb

## 2018-02-27 DIAGNOSIS — E039 Hypothyroidism, unspecified: Secondary | ICD-10-CM | POA: Diagnosis not present

## 2018-02-27 DIAGNOSIS — N912 Amenorrhea, unspecified: Secondary | ICD-10-CM | POA: Diagnosis not present

## 2018-02-27 LAB — LUTEINIZING HORMONE: LH: 0.17 m[IU]/mL

## 2018-02-27 LAB — FOLLICLE STIMULATING HORMONE: FSH: 2.9 m[IU]/mL

## 2018-02-27 LAB — TSH: TSH: 1.04 u[IU]/mL (ref 0.35–4.50)

## 2018-02-27 LAB — T4, FREE: FREE T4: 0.78 ng/dL (ref 0.60–1.60)

## 2018-02-27 LAB — T3, FREE: T3, Free: 2.7 pg/mL (ref 2.3–4.2)

## 2018-02-27 NOTE — Patient Instructions (Signed)
Please come back for a follow-up appointment in 6 months.  Please stop at the lab.    

## 2018-02-27 NOTE — Progress Notes (Signed)
Patient ID: Kim Myers, female   DOB: January 28, 1991, 27 y.o.   MRN: [id number]    HPI  Kim Myers is a 27 y.o.-year-old female, referred by her Barnett Hatter, MD, for management of newly dx'ed hypothyroidism.  She moved here recently from [place], [place].  Pt. has been dx with hypothyroidism in 12/2017 during investigation for amenorrhea >> not on Levothyroxine yet.  I reviewed pt's thyroid tests: Lab Results  Component Value Date   TSH 6.270 (H) 12/22/2017   FREET4 0.98 12/22/2017   T3FREE 1.8 (L) 12/22/2017   Pt denies feeling nodules in neck, hoarseness, dysphagia/odynophagia, SOB with lying down.  She has no FH of thyroid disorders No FH of thyroid cancer.  No h/o radiation tx to head or neck. No recent use of iodine supplements.  At that time, she was also found to have: - Mild anemia: hemoglobin 11 (11.1-15.9), normal MCV - Vitamin D deficiency: Vitamin D 15 - Hyponatremia: Sodium 127 (134-144), for a glucose of 86.  Chloride was also low, at 90 (96-106) - Elevated total cholesterol and LDL (201/104) - Low alkaline phosphatase of 37 (39-117) - Low estradiol <5.0, with inappropriately normal FSH.  However, she had normal prolactin, kidney function, B12, ferritin, folate. She was tested for celiac ds (EGD): negative - 2017.  Menarche: 2-18 y/o.  Menses normal up to 2017 (decreased frequency when she started to work out more) - she also decreased her weight 140s >> 120s.  She was started on Provera tablets after she had the above labs drawn. This did not work.   She is doing crossfit, intense running for 5/7 days, lifting weight  For 5 years, then slowed down 2 mo ago.   Exercise - 30 min - 1h: - walking - running  - but only few times - yoga - stand up paddle boarding - rowing  Meals: - Breakfast: banana or oatmeal + fruit, protein powder - Lunch: fish, veggies, salads, carrots + humus - Dinner:  fish, veggies, salads, carrots + humus - Snacks:  protein bar No gluten, no refined sugars, goat cheese, humus, fresh veggies, large amounts cabbage + broccoli >> stopped 2 mo ago.  Pt describes: -Weight loss in the last 5 years - Fatigue - Cold intolerance  But denies: - depression - constipation - dry skin - hair loss  Other labs reviewed as below: Component     Latest Ref Rng & Units 12/22/2017  Glucose     65 - 99 mg/dL 86  BUN     6 - 20 mg/dL 9  Creatinine     2.83 - 1.00 mg/dL 6.62  GFR, Est Non African American     >59 mL/min/1.73 117  GFR, Est African American     >59 mL/min/1.73 135  BUN/Creatinine Ratio     9 - 23 13  Sodium     134 - 144 mmol/L 127 (L)  Potassium     3.5 - 5.2 mmol/L 4.1  Chloride     96 - 106 mmol/L 90 (L)  CO2     20 - 29 mmol/L 23  Calcium     8.7 - 10.2 mg/dL 9.3  Total Protein     6.0 - 8.5 g/dL 6.8  Albumin     3.5 - 5.5 g/dL 5.0  Globulin, Total     1.5 - 4.5 g/dL 1.8  Albumin/Globulin Ratio     1.2 - 2.2 2.8 (H)  Total Bilirubin     0.0 -  1.2 mg/dL 0.2  Alkaline Phosphatase     39 - 117 IU/L 37 (L)  AST     0 - 40 IU/L 18  ALT     0 - 32 IU/L 14  WBC     3.4 - 10.8 x10E3/uL 6.1  RBC     3.77 - 5.28 x10E6/uL 3.58 (L)  Hemoglobin     11.1 - 15.9 g/dL 62.1 (L)  HCT     30.8 - 46.6 % 31.8 (L)  MCV     79 - 97 fL 89  MCH     26.6 - 33.0 pg 30.7  MCHC     31.5 - 35.7 g/dL 65.7  RDW     84.6 - 96.2 % 13.6  Platelets     150 - 450 x10E3/uL 318  Cholesterol, Total     100 - 199 mg/dL 952 (H)  Triglycerides     0 - 149 mg/dL 45  HDL Cholesterol     >39 mg/dL 88  VLDL Cholesterol Cal     5 - 40 mg/dL 9  LDL (calc)     0 - 99 mg/dL 841 (H)  Total CHOL/HDL Ratio     0.0 - 4.4 ratio 2.3  Vitamin B12     232 - 1,245 pg/mL 522  Folate     >3.0 ng/mL 12.5  TSH     0.450 - 4.500 uIU/mL 6.270 (H)  Vitamin D, 25-Hydroxy     30.0 - 100.0 ng/mL 15.0 (L)  FSH     mIU/mL 4.1  Estradiol     pg/mL <5.0  Prolactin     4.8 - 23.3 ng/mL 16.4  Ferritin     15 -  150 ng/mL 21   She was started on iron and vitamin D.  ROS: Constitutional: + See HPI Eyes: no blurry vision, no xerophthalmia ENT: no sore throat, + see HPI, + tinnitus Cardiovascular: no CP/SOB/palpitations/leg swelling Respiratory: no cough/SOB Gastrointestinal: no N/V/D/C Musculoskeletal: no muscle/joint aches Skin: no rashes Neurological: no tremors/numbness/tingling/dizziness Psychiatric: no depression /+anxiety + Low libido  Past Medical History:  Diagnosis Date  . Amenorrhea   . Blood transfusion without reported diagnosis   . Gluten intolerance   . History of anemia    resolved with dietary changes   Past Surgical History:  Procedure Laterality Date  . MECKEL DIVERTICULUM EXCISION  at 27   started as laparoscopy, ended in laparotomy   Social History   Socioeconomic History  . Marital status: Single    Spouse name: Not on file  . Number of children: 0  . Years of education: Not on file  . Highest education level: Not on file  Occupational History  .  Barista  Tobacco Use  . Smoking status: Never Smoker  . Smokeless tobacco: Never Used  Substance and Sexual Activity  . Alcohol use: No  . Drug use: No  . Sexual activity: Not Currently    Birth control/protection:  She is not sexually active right now   Current Outpatient Medications on File Prior to Visit  Medication Sig Dispense Refill  . Cholecalciferol (VITAMIN D3) 2000 units TABS Take 1 tablet by mouth daily.    . IRON PO Take 1 tablet by mouth daily.     No current facility-administered medications on file prior to visit.    Allergies  Allergen Reactions  . Gluten Meal Diarrhea, Nausea And Vomiting and Other (See Comments)    Caused internal bleeding that resulted in hospitalization!!  Family History  Problem Relation Age of Onset  . Breast cancer Paternal Grandmother   . Ovarian cancer Maternal Grandmother     PE: BP 126/78 (BP Location: Left Arm, Patient Position: Sitting, Cuff Size:  Normal)   Pulse 99   Temp 98.2 F (36.8 C) (Oral)   Ht 5\' 5"  (1.651 m)   Wt 123 lb (55.8 kg)   SpO2 99%   BMI 20.47 kg/m  Wt Readings from Last 3 Encounters:  02/27/18 123 lb (55.8 kg)  02/04/18 124 lb (56.2 kg)  12/22/17 124 lb 3.2 oz (56.3 kg)   Constitutional: normal weight, in NAD Eyes: PERRLA, EOMI, no exophthalmos ENT: moist mucous membranes, no thyromegaly, no cervical lymphadenopathy Cardiovascular: Tachycardia, RR, No MRG Respiratory: CTA B Gastrointestinal: abdomen soft, NT, ND, BS+ Musculoskeletal: no deformities, strength intact in all 4 Skin: moist, warm, no rashes, many tattoos Neurological: no tremor with outstretched hands, DTR normal in all 4  ASSESSMENT: 1. Hypothyroidism - new dx  2. Amenorrhea  PLAN:  1. Patient with new mild hypothyroidism, not on levothyroxine therapy. - she appears euthyroid but does have cold intolerance and fatigue.  - she does not appear to have a goiter, thyroid nodules, or neck compression symptoms - We discussed the possible reasons for hypothyroidism are: Iodine deficiency (she was eating a lot of cruciferous vegetables before the above labs return but she stopped afterwards in an effort to improve her thyroid tests), Hashimoto's thyroiditis (we will check for this today), anorexia/energy deficiency, exogenous interference from other substances (she is not taking medicines or supplements other than prescribed). - We discussed that if her TFTs remain abnormal, she may need levothyroxine. - will check thyroid tests today: TSH, free T4, free T3 and also TPO and ATA Ab's - If labs today are abnormal, she will need to return in ~6 weeks for repeat labs - Otherwise, I will see her back in 4 months  2. Amenorrhea - Patient has a history of irregular menstrual cycles until approximately approximately 2-3 years ago when they become less frequent and finally stopped.  Recent FSH was normal but an estradiol was undetectable, consistent with  central amenorrhea.  She mentions that her menstrual cycle started to become less frequent when she started to exercise more.  Before 12/2017, she was working out intensely and frequently but since she found out about the possibility that this may interact with her menstrual cycles, she is now exercising much less.  She is also trying to eat more to have a positive calorie balance.  I encouraged her to continue to do so and I advised her that she may also need to increase her weight to approximately 140 pounds which was approximately when she started to lose her menstrual cycles.  I advised her that she needs to be at the higher weight for at least 6 months for the menses to return. -We also need to rule out other possible causes for amenorrhea.  She is not sexually active so pregnancy is eliminated.  I think his very unlikely for such a mild hypothyroid profile to cause amenorrhea.  However, I will check her for PCOS today although my suspicion is low.  We will also check a 17 hydroxyprogesterone for [place]-CAH, although my suspicion is, again, low.  Hyperprolactinemia was ruled out by recent prolactin level.  I do not suspect a pituitary tumor in her, however, depending on the results of her labs, we may need pituitary MRI. -We discussed that ideally her menstrual cycles  will start spontaneously, otherwise, she has a risk of earlier osteoporosis.  HRT/hormone contraception can be used, however, this is only recommended for short time and not as efficacious as spontaneous menses for the quality of her bones.  However, if she does not have spontaneous return of her menses, she may need to be on exogenous estrogen/progesterone.  I will continue to follow her up for this.  At today's visit we will check the following labs: Orders Placed This Encounter  Procedures  . TSH  . T4, free  . T3, free  . Thyroglobulin antibody  . Thyroid peroxidase antibody  . Estradiol  . Testosterone, Free, Total, SHBG  .  Luteinizing hormone  . Follicle stimulating hormone  . 17-Hydroxyprogesterone   Office Visit on 02/27/2018  Component Date Value Ref Range Status  . TSH 02/27/2018 1.04  0.35 - 4.50 uIU/mL Final  . Free T4 02/27/2018 0.78  0.60 - 1.60 ng/dL Final   Comment: Specimens from patients who are undergoing biotin therapy and /or ingesting biotin supplements may contain high levels of biotin.  The higher biotin concentration in these specimens interferes with this Free T4 assay.  Specimens that contain high levels  of biotin may cause false high results for this Free T4 assay.  Please interpret results in light of the total clinical presentation of the patient.    . T3, Free 02/27/2018 2.7  2.3 - 4.2 pg/mL Final  . Thyroglobulin Ab 02/27/2018 <1  < or = 1 IU/mL Final  . Thyroperoxidase Ab SerPl-aCnc 02/27/2018 <1  <9 IU/mL Final  . Estradiol, Free 02/27/2018 0.13  pg/mL Final   Comment: . FEMALE REFERENCE RANGES FOR ESTRADIOL, FREE: .   Follicular Stage:  0.43-5.03 pg/mL   Mid-cycle Stage:   0.72-5.89 pg/mL   Luteal Stage:      0.40-5.55 pg/mL   Postmenopausal:    < or = 0.38 pg/mL   . Estradiol 02/27/2018 7  pg/mL Final   Comment: . FEMALE REFERENCE RANGES FOR ESTRADIOL: .   Follicular Stage:  39-375 pg/mL   Mid-cycle Stage:   94-762 pg/mL   Luteal Stage:      48-440 pg/mL   Postmenopausal:    < or = 10 pg/mL . This test was developed and its analytical performance characteristics have been determined by [facility]. It has not been cleared or approved by FDA. This assay has been validated pursuant to the CLIA regulations and is used for clinical purposes.   . Testosterone 02/27/2018 44  8 - 48 ng/dL Final  . Testosterone, Free 02/27/2018 4.1  0.0 - 4.2 pg/mL Final  . Sex Hormone Binding 02/27/2018 71.4  24.6 - 122.0 nmol/L Final  . LH 02/27/2018 0.17  mIU/mL Final   Comment: Female Reference Range:20-70 yrs     1.5-9.3 mIU/mL>70 yrs        3.1-35.6 mIU/mLFemale Reference Range:Follicular Phase     1.9-12.5 mIU/mLMidcycle             8.7-76.3 mIU/mLLuteal Phase         0.5-16.9 mIU/mL  Post Menopausal      15.9-54.0  mIU/mLPregnant             <1.5 mIU/mLContraceptives       0.7-5.6 mIU/mL   . [facility] 02/27/2018 2.9  mIU/ML Final   Female Reference Range:  1.4-18.1 mIU/mLFemale Reference Range:Follicular Phase          2.5-10.2 mIU/mLMidCycle Peak  3.4-33.4 mIU/mLLuteal Phase          1.5-9.1 mIU/mLPost Menopausal     23.0-116.3 mIU/mLPregnant          <0.3 mIU/mL  . 17-OH-Progesterone, LC/MS/MS 02/27/2018 40   ng/dL Final    Adult Female Reference Ranges   for 17-Hydroxyprogesterone: .   Pre-Menopausal Mid Follicular:  23 - 102 ng/dL   Pre-Menopausal Surge:           67 - 349 ng/dL   Pre-Menopausal Mid Luteal:     139 - 431 ng/dL   Postmenopausal Phase:          < or = 45 ng/dL .       Female Tanner Stages: .   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  I would like to check her for adrenal insufficiency next.  I will have the patient come for a cosyntropin stimulation test.  We will also add a DHEAS. Component     Latest Ref Rng & Units 03/17/2018 03/17/2018 03/17/2018         8:33 AM  9:12 AM  9:44 AM  Cortisol, Plasma     ug/dL 16.1 09.6 04.5  W098 ACTH     6 - 50 pg/mL 60 (H)     DHEAS was high, aligning with the high ACTH: Component     Latest Ref Rng & Units 03/17/2018  DHEA-Sulfate, LCMS     ug/dL 119 (H)   Dexamethasone suppression test showed an unsuppressed cortisol level, however, the dexamethasone level was lower than expected, so patient is likely a fast metabolizer. Component     Latest Ref Rng & Units 04/01/2018  Dexamethasone, Blood     ng/dL 47  Cortisol     ug/dL 14.7   I advised the patient to perform a 24-hour collection for cortisol.  This is pending.  I will addend the results when they become  available.  Carlus Pavlov, MD PhD [facility] Endocrinology

## 2018-02-28 LAB — TESTOSTERONE, FREE, TOTAL, SHBG
SEX HORMONE BINDING: 71.4 nmol/L (ref 24.6–122.0)
TESTOSTERONE FREE: 4.1 pg/mL (ref 0.0–4.2)
Testosterone: 44 ng/dL (ref 8–48)

## 2018-03-06 LAB — THYROID PEROXIDASE ANTIBODY: Thyroperoxidase Ab SerPl-aCnc: <1 IU/mL (ref <9)

## 2018-03-06 LAB — ESTRADIOL, FREE
Estradiol, Free: 0.13 pg/mL
Estradiol: 7 pg/mL

## 2018-03-06 LAB — THYROGLOBULIN ANTIBODY

## 2018-03-06 LAB — 17-HYDROXYPROGESTERONE: 17-OH-PROGESTERONE, LC/MS/MS: 40 ng/dL

## 2018-03-09 ENCOUNTER — Telehealth: Payer: Self-pay | Admitting: Internal Medicine

## 2018-03-09 NOTE — Telephone Encounter (Signed)
Patient returned call. Please call patient at ph# [phone]

## 2018-03-09 NOTE — Telephone Encounter (Signed)
Duplicate note

## 2018-03-09 NOTE — Telephone Encounter (Signed)
Patient returning call. Please call patient at ph# [phone]

## 2018-03-11 NOTE — Telephone Encounter (Signed)
LM for patient to call 

## 2018-03-13 ENCOUNTER — Other Ambulatory Visit: Payer: BLUE CROSS/BLUE SHIELD

## 2018-03-17 ENCOUNTER — Other Ambulatory Visit (INDEPENDENT_AMBULATORY_CARE_PROVIDER_SITE_OTHER): Payer: BLUE CROSS/BLUE SHIELD

## 2018-03-17 DIAGNOSIS — N912 Amenorrhea, unspecified: Secondary | ICD-10-CM | POA: Diagnosis not present

## 2018-03-17 LAB — CORTISOL
CORTISOL PLASMA: 22.4 ug/dL
Cortisol, Plasma: 24.1 ug/dL
Cortisol, Plasma: 25.2 ug/dL

## 2018-03-17 MED ORDER — COSYNTROPIN NICU IV SYRINGE 0.25 MG/ML (STANDARD DOSE)
0.2500 mg | Freq: Once | INTRAVENOUS | Status: AC
  Administered 2018-03-17: 0.25 mg via INTRAMUSCULAR

## 2018-03-17 NOTE — Progress Notes (Unsigned)
Per orders of Dr. Elvera LennoxGherghe injection of cortrosyn given today by [facility] . Patient tolerated injection well.

## 2018-03-20 LAB — ACTH: C206 ACTH: 60 pg/mL — ABNORMAL HIGH (ref 6–50)

## 2018-03-25 ENCOUNTER — Encounter: Payer: Self-pay | Admitting: Internal Medicine

## 2018-03-25 DIAGNOSIS — E039 Hypothyroidism, unspecified: Secondary | ICD-10-CM

## 2018-03-25 DIAGNOSIS — N912 Amenorrhea, unspecified: Secondary | ICD-10-CM | POA: Insufficient documentation

## 2018-03-25 HISTORY — DX: Hypothyroidism, unspecified: E03.9

## 2018-03-27 ENCOUNTER — Other Ambulatory Visit: Payer: Self-pay | Admitting: Internal Medicine

## 2018-03-27 DIAGNOSIS — R7989 Other specified abnormal findings of blood chemistry: Secondary | ICD-10-CM

## 2018-03-27 LAB — DHEA-SULFATE, SERUM: DHEA: 522 ug/dL — AB

## 2018-03-27 MED ORDER — DEXAMETHASONE 1 MG PO TABS: ORAL_TABLET | ORAL | 0 refills | Status: DC

## 2018-03-30 ENCOUNTER — Telehealth: Payer: Self-pay | Admitting: Internal Medicine

## 2018-03-30 NOTE — Telephone Encounter (Signed)
Patient is confused about the RX fill on dexamethasone (DECADRON) 1 MG tablet stating they will not have to be back until 2020. Please advise patient.  Ph # [phone] leave detailed message

## 2018-03-30 NOTE — Progress Notes (Signed)
Letter sent.

## 2018-03-31 ENCOUNTER — Telehealth: Payer: Self-pay | Admitting: Internal Medicine

## 2018-03-31 NOTE — Telephone Encounter (Signed)
We have been trying to get in touch with patient regarding test results:        Kim Myers, can you please call pt: I have been tried to call the patient on her cell phone but I cannot get in touch with her and I did not want to leave a message. Can you please call her that her latest labs are a little abnormal and I would like to check another test for excess cortisol production. This test involves taking the dexamethasone tablet at 11 PM the night coming for labs at 8 AM. I will send a dexamethasone tablet to her pharmacy and please have her schedule an 8 AM lab appointment. Please also advise her if possible to join my chart, for easier communication.      LM for patient

## 2018-03-31 NOTE — Telephone Encounter (Signed)
Notified patient of message from Dr. Gherghe, patient expressed understanding and agreement. No further questions.  

## 2018-03-31 NOTE — Telephone Encounter (Signed)
Pt called to get lab results. I did set the appt for the labs tomorrow. Pt wants to know if she needs to fast or not.

## 2018-03-31 NOTE — Telephone Encounter (Signed)
Notified patient.

## 2018-04-01 ENCOUNTER — Other Ambulatory Visit (INDEPENDENT_AMBULATORY_CARE_PROVIDER_SITE_OTHER): Payer: BLUE CROSS/BLUE SHIELD

## 2018-04-01 ENCOUNTER — Other Ambulatory Visit: Payer: Self-pay | Admitting: Internal Medicine

## 2018-04-01 DIAGNOSIS — R7989 Other specified abnormal findings of blood chemistry: Secondary | ICD-10-CM

## 2018-04-06 ENCOUNTER — Ambulatory Visit (INDEPENDENT_AMBULATORY_CARE_PROVIDER_SITE_OTHER): Payer: BLUE CROSS/BLUE SHIELD

## 2018-04-06 VITALS — BP 110/64 | HR 73 | Wt 126.0 lb

## 2018-04-06 DIAGNOSIS — Z23 Encounter for immunization: Secondary | ICD-10-CM

## 2018-04-06 NOTE — Progress Notes (Signed)
Patient in today for 2nd Gardasil injection.   Contraception: abstinence LMP: unknown Last AEX: 12/22/2017 with Dr Gertie Exon  Injection given in right deltoid. Patient tolerated shot well.   Patient informed next injection due in about 4 months.  Advised patient, if not on birth control, to return for next injection with cycle.   Routed to provider for final review.  Encounter closed.

## 2018-04-09 LAB — CORTISOL: Cortisol: 17.6 ug/dL

## 2018-04-09 LAB — DEXAMETHASONE, BLOOD: Dexamethasone, Blood: 47 ng/dL

## 2018-04-13 ENCOUNTER — Other Ambulatory Visit: Payer: Self-pay | Admitting: Internal Medicine

## 2018-04-13 DIAGNOSIS — R7989 Other specified abnormal findings of blood chemistry: Secondary | ICD-10-CM

## 2018-05-05 DIAGNOSIS — F419 Anxiety disorder, unspecified: Secondary | ICD-10-CM | POA: Diagnosis not present

## 2018-05-14 DIAGNOSIS — F419 Anxiety disorder, unspecified: Secondary | ICD-10-CM | POA: Diagnosis not present

## 2018-05-28 DIAGNOSIS — F419 Anxiety disorder, unspecified: Secondary | ICD-10-CM | POA: Diagnosis not present

## 2018-06-11 DIAGNOSIS — F419 Anxiety disorder, unspecified: Secondary | ICD-10-CM | POA: Diagnosis not present

## 2018-06-25 DIAGNOSIS — F419 Anxiety disorder, unspecified: Secondary | ICD-10-CM | POA: Diagnosis not present

## 2018-07-09 DIAGNOSIS — F419 Anxiety disorder, unspecified: Secondary | ICD-10-CM | POA: Diagnosis not present

## 2018-07-23 DIAGNOSIS — F419 Anxiety disorder, unspecified: Secondary | ICD-10-CM | POA: Diagnosis not present

## 2018-08-05 NOTE — Progress Notes (Signed)
Patient in today for 3rd  Gardasil injection.   Contraception: abstinence  LMP: unknown  Last AEX: 12/22/17 with JJ  Injection given in LD. Patient tolerated shot well.   Patient informed she is finished with the series.  Advised patient, if not on birth control, to return for next injection with cycle.   Routed to provider for final review.  Encounter closed.

## 2018-08-06 DIAGNOSIS — F419 Anxiety disorder, unspecified: Secondary | ICD-10-CM | POA: Diagnosis not present

## 2018-08-07 ENCOUNTER — Ambulatory Visit (INDEPENDENT_AMBULATORY_CARE_PROVIDER_SITE_OTHER): Payer: BLUE CROSS/BLUE SHIELD

## 2018-08-07 ENCOUNTER — Ambulatory Visit: Payer: BLUE CROSS/BLUE SHIELD

## 2018-08-07 VITALS — BP 100/64 | HR 73 | Resp 14 | Ht 65.0 in | Wt 130.0 lb

## 2018-08-07 DIAGNOSIS — Z23 Encounter for immunization: Secondary | ICD-10-CM

## 2018-08-20 DIAGNOSIS — F419 Anxiety disorder, unspecified: Secondary | ICD-10-CM | POA: Diagnosis not present

## 2018-08-25 ENCOUNTER — Ambulatory Visit: Payer: BLUE CROSS/BLUE SHIELD | Admitting: Internal Medicine

## 2018-08-25 ENCOUNTER — Encounter: Payer: Self-pay | Admitting: Internal Medicine

## 2018-08-25 ENCOUNTER — Other Ambulatory Visit: Payer: Self-pay

## 2018-08-25 VITALS — BP 108/60 | HR 62 | Ht 65.0 in | Wt 131.0 lb

## 2018-08-25 DIAGNOSIS — E039 Hypothyroidism, unspecified: Secondary | ICD-10-CM

## 2018-08-25 DIAGNOSIS — N912 Amenorrhea, unspecified: Secondary | ICD-10-CM | POA: Diagnosis not present

## 2018-08-25 NOTE — Progress Notes (Signed)
Patient ID: Kim Myers, female   DOB: 1990-07-03, 28 y.o.   MRN: [id number]    HPI  Kim Myers is a 28 y.o.-year-old female, initially referred by her Barnett Hatter, MD, for management of amenorrhea and hypothyroidism.  She moved to [place] from [place], [place].  Last visit 6 months ago.  At last visit, we repeated investigation for hypothyroidism and the TFTs were normal: Lab Results  Component Value Date   TSH 1.04 02/27/2018   TSH 6.270 (H) 12/22/2017   FREET4 0.78 02/27/2018   FREET4 0.98 12/22/2017   T3FREE 2.7 02/27/2018   T3FREE 1.8 (L) 12/22/2017   Thyroid antibodies were also negative: Component     Latest Ref Rng & Units 02/27/2018  Thyroglobulin Ab     < or = 1 IU/mL <1  Thyroperoxidase Ab SerPl-aCnc     <9 IU/mL <1   She has no FH of thyroid disorders. No FH of thyroid cancer. No h/o radiation tx to head or neck.  No herbal supplements. No Biotin use. No recent steroids use.   At last visit we reviewed her previous labs: - Mild anemia: hemoglobin 11 (11.1-15.9), normal MCV - Vitamin D deficiency: Vitamin D 15 - Hyponatremia: Sodium 127 (134-144), for a glucose of 86.  Chloride was also low, at 90 (96-106) - Elevated total cholesterol and LDL (201/104) - Low alkaline phosphatase of 37 (39-117) - Low estradiol <5.0, with inappropriately normal FSH.  However, she had normal prolactin, kidney function, B12, ferritin, folate. She was tested for celiac ds (EGD): negative - 2017.  Menarche: 20-18 y/o. Menses normal up to 2017 (decreased frequency when she started to work out more) - she also decreased her weight 140s >> 120s. She was started on Provera tablets after she had the above labs drawn. This did not work.   In the past, she was doing CrossFit, and intense running for 5/7 days, lifting weight  For 5 years, then she slowed down 2 months before last appointment. She is currently exercising 30 minutes to an hour every day: - walking -  running  - yoga - stand up paddle boarding - rowing  We reviewed her meals at last visit: - Breakfast: banana or oatmeal + fruit, protein powder - Lunch: fish, veggies, salads, carrots + humus - Dinner:  fish, veggies, salads, carrots + humus - Snacks: protein bar At that time she was telling me that she did not eat gluten or refined sugars, eats goat cheese, humus, fresh veggies, large amounts cabbage + broccoli >> stopped 2 months before our last appointment.  At this visit, she tells me that she started to work with a counselor Romualdo Bolk) and became more open with her dietary restrictions.  She is telling me that she did not bring this up at our visit in the past or with any other doctor before but she felt that this was a problem as she was working very hard on restricting food.  As of now, she feels that she is more relaxed about this and reintroduced some carbs and even occasional snacks.  At last visit, she described weight loss in the last 5 years, fatigue, cold intolerance.  Labs reviewed as below: Component     Latest Ref Rng & Units 12/22/2017  Glucose     65 - 99 mg/dL 86  BUN     6 - 20 mg/dL 9  Creatinine     7.82 - 1.00 mg/dL 9.56  GFR, Est Non African  American     >59 mL/min/1.73 117  GFR, Est African American     >59 mL/min/1.73 135  BUN/Creatinine Ratio     9 - 23 13  Sodium     134 - 144 mmol/L 127 (L)  Potassium     3.5 - 5.2 mmol/L 4.1  Chloride     96 - 106 mmol/L 90 (L)  CO2     20 - 29 mmol/L 23  Calcium     8.7 - 10.2 mg/dL 9.3  Total Protein     6.0 - 8.5 g/dL 6.8  Albumin     3.5 - 5.5 g/dL 5.0  Globulin, Total     1.5 - 4.5 g/dL 1.8  Albumin/Globulin Ratio     1.2 - 2.2 2.8 (H)  Total Bilirubin     0.0 - 1.2 mg/dL 0.2  Alkaline Phosphatase     39 - 117 IU/L 37 (L)  AST     0 - 40 IU/L 18  ALT     0 - 32 IU/L 14  WBC     3.4 - 10.8 x10E3/uL 6.1  RBC     3.77 - 5.28 x10E6/uL 3.58 (L)  Hemoglobin     11.1 - 15.9 g/dL 27.5 (L)   HCT     17.0 - 46.6 % 31.8 (L)  MCV     79 - 97 fL 89  MCH     26.6 - 33.0 pg 30.7  MCHC     31.5 - 35.7 g/dL 01.7  RDW     49.4 - 49.6 % 13.6  Platelets     150 - 450 x10E3/uL 318  Cholesterol, Total     100 - 199 mg/dL 759 (H)  Triglycerides     0 - 149 mg/dL 45  HDL Cholesterol     >39 mg/dL 88  VLDL Cholesterol Cal     5 - 40 mg/dL 9  LDL (calc)     0 - 99 mg/dL 163 (H)  Total CHOL/HDL Ratio     0.0 - 4.4 ratio 2.3  Vitamin B12     232 - 1,245 pg/mL 522  Folate     >3.0 ng/mL 12.5  TSH     0.450 - 4.500 uIU/mL 6.270 (H)  Vitamin D, 25-Hydroxy     30.0 - 100.0 ng/mL 15.0 (L)  FSH     mIU/mL 4.1  Estradiol     pg/mL <5.0  Prolactin     4.8 - 23.3 ng/mL 16.4  Ferritin     15 - 150 ng/mL 21   At last visit, we performed further investigation for her hypothyroidism and amenorrhea.  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 TSH 02/27/2018 1.04  0.35 - 4.50 uIU/mL Final  . Free T4 02/27/2018 0.78  0.60 - 1.60 ng/dL Final   Comment: Specimens from patients who are undergoing biotin therapy and /or ingesting biotin supplements may contain high levels of biotin.  The higher biotin concentration in these specimens interferes with this Free T4 assay.  Specimens that contain high levels  of biotin may cause false high results for this Free T4 assay.  Please interpret results in light of the total clinical presentation of the patient.    . T3, Free 02/27/2018 2.7  2.3 - 4.2 pg/mL Final  . Thyroglobulin Ab 02/27/2018 <1  < or = 1 IU/mL Final  . Thyroperoxidase Ab SerPl-aCnc  02/27/2018 <1  <9 IU/mL Final  . Estradiol, Free 02/27/2018 0.13  pg/mL Final   Comment: . FEMALE REFERENCE RANGES FOR ESTRADIOL, FREE: .   Follicular Stage:  0.43-5.03 pg/mL   Mid-cycle Stage:   0.72-5.89 pg/mL   Luteal Stage:      0.40-5.55 pg/mL   Postmenopausal:    < or = 0.38 pg/mL   . Estradiol 02/27/2018 7  pg/mL  Final   Comment: . FEMALE REFERENCE RANGES FOR ESTRADIOL: .   Follicular Stage:  39-375 pg/mL   Mid-cycle Stage:   94-762 pg/mL   Luteal Stage:      48-440 pg/mL   Postmenopausal:    < or = 10 pg/mL . This test was developed and its analytical performance characteristics have been determined by [facility]. It has not been cleared or approved by FDA. This assay has been validated pursuant to the CLIA regulations and is used for clinical purposes.   . Testosterone 02/27/2018 44  8 - 48 ng/dL Final  . Testosterone, Free 02/27/2018 4.1  0.0 - 4.2 pg/mL Final  . Sex Hormone Binding 02/27/2018 71.4  24.6 - 122.0 nmol/L Final  . LH 02/27/2018 0.17  mIU/mL Final   Comment: Female Reference Range:20-70 yrs     1.5-9.3 mIU/mL>70 yrs       3.1-35.6 mIU/mLFemale Reference Range:Follicular Phase     1.9-12.5 mIU/mLMidcycle             8.7-76.3 mIU/mLLuteal Phase         0.5-16.9 mIU/mL  Post Menopausal      15.9-54.0  mIU/mLPregnant             <1.5 mIU/mLContraceptives       0.7-5.6 mIU/mL   . [facility] 02/27/2018 2.9  mIU/ML Final   Female Reference Range:  1.4-18.1 mIU/mLFemale Reference Range:Follicular Phase          2.5-10.2 mIU/mLMidCycle Peak          3.4-33.4 mIU/mLLuteal Phase          1.5-9.1 mIU/mLPost Menopausal     23.0-116.3 mIU/mLPregnant          <0.3 mIU/mL  . 17-OH-Progesterone, LC/MS/MS 02/27/2018 40   ng/dL Final    Adult Female Reference Ranges   for 17-Hydroxyprogesterone: .   Pre-Menopausal Mid Follicular:  23 - 102 ng/dL   Pre-Menopausal Surge:           67 - 349 ng/dL   Pre-Menopausal Mid Luteal:     139 - 431 ng/dL   Postmenopausal Phase:          < or = 45 ng/dL .       Female Tanner Stages: .   II - III Females:  18 - 220 ng/dL   IV - V   Females:  36 - 200 ng/dL   Patient was called back for cosyntropin stimulation test to rule out adrenal insufficiency.  Her cortisol levels were slightly high from the beginning and they did  not increase much.  Moreover, her ACTH level was elevated. Component     Latest Ref Rng & Units 03/17/2018 03/17/2018 03/17/2018         8:33 AM  9:12 AM  9:44 AM  Cortisol, Plasma     ug/dL 16.1 09.6 04.5  W098 ACTH     6 - 50 pg/mL 60 (H)     Her DHEAS was high, a line with a high ACTH: Component  Latest Ref Rng & Units 03/17/2018  DHEA-Sulfate, LCMS     ug/dL 213 (H)   Dexamethasone suppression test showed a nonsuppressed cortisol level, however, the dexamethasone level was lower than expected, so patient is likely a fast metabolizer.  In this situation, the DFT test is uninterpretable. Component     Latest Ref Rng & Units 04/01/2018  Dexamethasone, Blood     ng/dL 47  Cortisol     ug/dL 08.6   I next advised the patient to perform a 24-hour urine collection for cortisol.  She did not do this yet.  She continues on iron and vitamin D.  ROS: Constitutional: no weight gain/no weight loss, no fatigue, no subjective hyperthermia, no subjective hypothermia Eyes: no blurry vision, no xerophthalmia ENT: no sore throat, no nodules palpated in neck, no dysphagia, no odynophagia, no hoarseness Cardiovascular: no CP/no SOB/no palpitations/no leg swelling Respiratory: no cough/no SOB/no wheezing Gastrointestinal: no N/no V/no D/no C/no acid reflux Musculoskeletal: no muscle aches/no joint aches Skin: no rashes, no hair loss Neurological: no tremors/no numbness/no tingling/no dizziness  I reviewed pt's medications, allergies, PMH, social hx, family hx, and changes were documented in the history of present illness. Otherwise, unchanged from my initial visit note.  Past Medical History:  Diagnosis Date  . Amenorrhea   . Blood transfusion without reported diagnosis   . Gluten intolerance   . History of anemia    resolved with dietary changes  . Hypothyroidism (acquired) 03/25/2018   Past Surgical History:  Procedure Laterality Date  . MECKEL DIVERTICULUM EXCISION  at 19   started  as laparoscopy, ended in laparotomy   Social History   Socioeconomic History  . Marital status: Single    Spouse name: Not on file  . Number of children: 0  . Years of education: Not on file  . Highest education level: Not on file  Occupational History  .  Barista  Tobacco Use  . Smoking status: Never Smoker  . Smokeless tobacco: Never Used  Substance and Sexual Activity  . Alcohol use: No  . Drug use: No  . Sexual activity: Not Currently    Birth control/protection:  She is not sexually active right now   Current Outpatient Medications on File Prior to Visit  Medication Sig Dispense Refill  . Cholecalciferol (VITAMIN D3) 2000 units TABS Take 1 tablet by mouth daily.    Marland Kitchen dexamethasone (DECADRON) 1 MG tablet Take 1 tablet by mouth once at 11 pm, before coming for labs at 8 am the next morning 1 tablet 0  . IRON PO Take 1 tablet by mouth daily.     No current facility-administered medications on file prior to visit.    Allergies  Allergen Reactions  . Gluten Meal Diarrhea, Nausea And Vomiting and Other (See Comments)    Caused internal bleeding that resulted in hospitalization!!   Family History  Problem Relation Age of Onset  . Breast cancer Paternal Grandmother   . Ovarian cancer Maternal Grandmother     PE: BP 108/60   Pulse 62   Ht  (1.651 m)   Wt 131 lb (59.4 kg)   SpO2 98%   BMI 21.80 kg/m  Wt Readings from Last 3 Encounters:  08/25/18 131 lb (59.4 kg)  08/07/18 130 lb (59 kg)  04/06/18 126 lb (57.2 kg)   Constitutional: Normal weight, in NAD Eyes: PERRLA, EOMI, no exophthalmos ENT: moist mucous membranes, no thyromegaly, no cervical lymphadenopathy Cardiovascular: RRR, No MRG Respiratory: CTA B Gastrointestinal:  abdomen soft, NT, ND, BS+ Musculoskeletal: no deformities, strength intact in all 4 Skin: moist, warm, no rashes Neurological: no tremor with outstretched hands, DTR normal in all 4  ASSESSMENT: 1. Hypothyroidism  2.   Hypogonadotropic hypogonadism/hypothalamic amenorrhea  PLAN:  1. Patient with history of mild hypothyroidism before last visit, however, thyroid tests checked at last visit were normal and her antithyroglobulin and TPO antibodies were negative then. -No intervention needed.  2.  Hypogonadotropic hypogonadism -Patient with a history of regular menstrual cycles until approximately 3 years ago when they became less frequent and finally stopped.  At last visit, we reviewed her previous labs showing a normal FSH but an undetectable estradiol, consistent with central amenorrhea.  She admitted that her cycles became less frequent when she started to exercise more.  Before 12/2017, she was working out intensely and frequently and after she found out that this can impact her menstrual cycles, she reduced her exercise intensity and frequency.  At last visit she was telling me that she also tried to eat more to have a positive calorie balance.  We did discuss at that time that we may need to increase her weight to approximately 140 pounds which was the approximate weight around the time when she started to lose her menstrual cycles.  We again discussed about this at this visit and I advised her that she may need to stay at this weight for at least 6 months to be able to have a return of her menses. -At last visit, we checked several labs to elucidate her amenorrhea.  She was not sexually active so pregnancy was not a possible culprit.  She had mildly elevated TSH before our last visit, however, at our last visit, all her TFTs were normal.  We ruled out PCOS by normal testosterone, ruled out Sutter-CAH by normal 17 hydroxyprogesterone.  He had a previously normal prolactin which ruled out prolactinoma.  Her LH and FSH were normal, however, she had a low estrogen again, pointing towards hypogonadotropic hypogonadism. A cosyntropin stimulation test showed a high normal cortisol that did not stimulate much.  However, overall,  final cortisol level was normal. -The only abnormality discovered at last visit was a high normal cortisol level with a high ACTH and a high DHEAS.  This was evidence for increased cortisol production stemming in the pituitary.  At that point, I called her back for a dexamethasone suppression test.  Her cortisol level was nonsuppressed, however, her dexamethasone level was too low, pointing towards the fact that she is a fast metabolizer and a DST test is not interpretable for her.  At that point, I advised her to come back for 24-hour urine cortisol, but she did not do so.  My thought was to go ahead with this and if elevated to go with a pituitary MRI.  However, without clinical evidence of Cushing disease, this diagnosis would be still very unlikely.  Resistance to cortisol action would be more likely, but even in this case, she does not fit the profile very well. -At this visit, however, she tells me that she was restricting her food quite a bit at last visit and she did not openly discuss about it.  As of now, she feels that she is doing better with this after she started to work with her counselor.  She gained approximately 5 pounds since last visit.  This is a very good sign.  We discussed that a high ACTH and cortisol level is not unusual in  anorexia, and, I believe, this may be the reason for her unusual labs.  She is definitely on a good track with her weight gain and we discussed that I expect to see her labs improving and hopefully have return of her menses if she continues to do so well. -I would like to see her back in 3 to 4 months to see if she did have return of her menses.  If not, we can start her on HRT/hormonal contraception, but only for short time until her body recovers and she has spontaneous return of her menses. - For now, we will check the following labs fasting, between 8 and 9 AM: Orders Placed This Encounter  Procedures  . DHEA-Sulfate, Serum  . Estradiol  . ACTH  . Cortisol   . Luteinizing hormone  . Follicle stimulating hormone  . Creatinine, urine, 24 hour  . Cortisol,free,24 hour urine w/creatinine  -I did advise her that we may need to check a urine collection for cortisol if the cortisol level is elevated, but my suspicion for a pathologic elevation is low  - time spent with the patient: 40 min, of which >50% was spent in obtaining information about her symptoms, reviewing her previous labs, evaluations, and treatments, counseling her about her condition (please see the discussed topics above), and developing a plan to further investigate and treat it; she had a number of questions which I addressed.  Component     Latest Ref Rng & Units 08/26/2018          Estradiol, Free     pg/mL 0.21  Estradiol     pg/mL 13  FSH     mIU/ML 5.9  LH     mIU/mL 0.82  Cortisol, Plasma     ug/dL 89.1  Q945 ACTH     6 - 50 pg/mL 31  DHEA-Sulfate, LCMS     ug/dL 038 (H)  DHEAS is still elevated, but improved.  Improved cortisol, ACTH.  Also, improved LH, FSH, estradiol (however, estradiol is still lower than normal): Component     Latest Ref Rng & Units 12/22/2017 02/27/2018 08/26/2018  Estradiol, Free     pg/mL  0.13 0.21  Estradiol     pg/mL <5.0 7 13  FSH     mIU/ML 4.1 2.9 5.9  LH     mIU/mL  0.17 0.82     Component     Latest Ref Rng & Units 09/01/2018  24 Hour urine volume (VMAHVA)     mL 3,500  Cortisol (Ur), Free     4.0 - 50.0 mcg/24 h 98.6 (H)  Results received     0.50 - 2.15 g/24 h 1.44   24-hour urine cortisol is elevated, and although I suspect that this is due to dietary restriction, we will plan to repeat her labs including the urine collection at next visit in 3 to 4 months.  At that time, if urine cortisol is still elevated, we will need a pituitary MRI.  Carlus Pavlov, MD PhD [facility]

## 2018-08-25 NOTE — Patient Instructions (Addendum)
Please come back fasting at 8-9 am for las.  Pick up the urine jug at that time.  Please return in 3-4 months.   Patient information (Up-to-Date): Collection of a 24-hour urine specimen   - You should collect every drop of urine during each 24-hour period. It does not matter how much or little urine is passed each time, as long as every drop is collected. - Begin the urine collection in the morning after you wake up, after you have emptied your bladder for the first time. - Urinate (empty the bladder) for the first time and flush it down the toilet. Note the exact time (eg, 6:15 AM). You will begin the urine collection at this time. - Collect every drop of urine during the day and night in an empty collection bottle. Store the bottle at room temperature or in the refrigerator. - If you need to have a bowel movement, any urine passed with the bowel movement should be collected. Try not to include feces with the urine collection. If feces does get mixed in, do not try to remove the feces from the urine collection bottle. - Finish by collecting the first urine passed the next morning, adding it to the collection bottle. This should be within ten minutes before or after the time of the first morning void on the first day (which was flushed). In this example, you would try to void between 6:05 and 6:25 on the second day. - If you need to urinate one hour before the final collection time, drink a full glass of water so that you can void again at the appropriate time. If you have to urinate 20 minutes before, try to hold the urine until the proper time. - Please note the exact time of the final collection, even if it is not the same time as when collection began on day 1. - The bottle(s) may be kept at room temperature for a day or two, but should be kept cool or refrigerated for longer periods of time.

## 2018-08-26 ENCOUNTER — Other Ambulatory Visit: Payer: Self-pay

## 2018-08-26 ENCOUNTER — Other Ambulatory Visit (INDEPENDENT_AMBULATORY_CARE_PROVIDER_SITE_OTHER): Payer: BLUE CROSS/BLUE SHIELD

## 2018-08-26 DIAGNOSIS — N912 Amenorrhea, unspecified: Secondary | ICD-10-CM

## 2018-08-26 LAB — FOLLICLE STIMULATING HORMONE: FSH: 5.9 m[IU]/mL

## 2018-08-26 LAB — CORTISOL: Cortisol, Plasma: 16.2 ug/dL

## 2018-08-26 LAB — LUTEINIZING HORMONE: LH: 0.82 m[IU]/mL

## 2018-08-28 ENCOUNTER — Ambulatory Visit: Payer: BLUE CROSS/BLUE SHIELD | Admitting: Internal Medicine

## 2018-08-31 LAB — DHEA-SULFATE, SERUM: DHEA-Sulfate, LCMS: 448 ug/dL — ABNORMAL HIGH

## 2018-09-01 ENCOUNTER — Other Ambulatory Visit: Payer: BLUE CROSS/BLUE SHIELD

## 2018-09-01 DIAGNOSIS — N912 Amenorrhea, unspecified: Secondary | ICD-10-CM

## 2018-09-03 DIAGNOSIS — F419 Anxiety disorder, unspecified: Secondary | ICD-10-CM | POA: Diagnosis not present

## 2018-09-04 LAB — CORTISOL, URINE, 24 HOUR
24 Hour urine volume (VMAHVA): 3500 mL
CORTISOL (UR), FREE: 98.6 ug/(24.h) — AB (ref 4.0–50.0)
RESULTS RECEIVED: 1.44 g/(24.h) (ref 0.50–2.15)

## 2018-09-04 LAB — EXTRA URINE SPECIMEN

## 2018-09-05 LAB — ESTRADIOL, FREE
Estradiol, Free: 0.21 pg/mL
Estradiol: 13 pg/mL

## 2018-09-05 LAB — ACTH: C206 ACTH: 31 pg/mL (ref 6–50)

## 2018-09-17 DIAGNOSIS — F419 Anxiety disorder, unspecified: Secondary | ICD-10-CM | POA: Diagnosis not present

## 2018-10-05 DIAGNOSIS — F419 Anxiety disorder, unspecified: Secondary | ICD-10-CM | POA: Diagnosis not present

## 2018-10-20 DIAGNOSIS — F419 Anxiety disorder, unspecified: Secondary | ICD-10-CM | POA: Diagnosis not present

## 2018-11-05 DIAGNOSIS — F419 Anxiety disorder, unspecified: Secondary | ICD-10-CM | POA: Diagnosis not present

## 2018-11-19 DIAGNOSIS — F419 Anxiety disorder, unspecified: Secondary | ICD-10-CM | POA: Diagnosis not present

## 2018-12-03 DIAGNOSIS — F419 Anxiety disorder, unspecified: Secondary | ICD-10-CM | POA: Diagnosis not present

## 2018-12-15 ENCOUNTER — Telehealth: Payer: Self-pay | Admitting: Obstetrics and Gynecology

## 2018-12-15 NOTE — Telephone Encounter (Signed)
Spoke with patient. Advised unable to schedule AEX as WebEx visit. Reviewed Covid 19 precautions, patient request to reschedule AEX to later date at this time. Denies any GYN concerns. Patient is requesting to reschedule to 05/2019. No hx of abnormal pap. Advised [id number] restrictions can change in the winter months, patient request AEX in 05/2019, will be finishing grad school, works better for her. AEX rescheduled to 12/2 at 1pm with Dr. Talbert Nan.   Routing to provider for final review. Patient is agreeable to disposition. Will close encounter.

## 2018-12-15 NOTE — Telephone Encounter (Signed)
Left message to call Sharee Pimple, RN at [facility].    Unable to do AEX as WebEx, if no current concerns offer to reschedule AEX to later date. Review Covid 19 precautions.

## 2018-12-15 NOTE — Telephone Encounter (Signed)
Left message to call Petro Talent, RN at [facility] [phone].   

## 2018-12-15 NOTE — Telephone Encounter (Signed)
Patient is requesting that her annual exam be a virtual appointment. Aware that pap and breast exam are done during annual. States she is having no problems.

## 2018-12-15 NOTE — Telephone Encounter (Signed)
Patient returned call

## 2018-12-17 DIAGNOSIS — F419 Anxiety disorder, unspecified: Secondary | ICD-10-CM | POA: Diagnosis not present

## 2018-12-24 ENCOUNTER — Ambulatory Visit: Payer: BLUE CROSS/BLUE SHIELD | Admitting: Obstetrics and Gynecology

## 2018-12-28 ENCOUNTER — Other Ambulatory Visit: Payer: Self-pay

## 2018-12-28 ENCOUNTER — Encounter: Payer: Self-pay | Admitting: Internal Medicine

## 2018-12-28 ENCOUNTER — Ambulatory Visit (INDEPENDENT_AMBULATORY_CARE_PROVIDER_SITE_OTHER): Payer: BC Managed Care – PPO | Admitting: Internal Medicine

## 2018-12-28 DIAGNOSIS — N912 Amenorrhea, unspecified: Secondary | ICD-10-CM | POA: Diagnosis not present

## 2018-12-28 DIAGNOSIS — R82998 Other abnormal findings in urine: Secondary | ICD-10-CM

## 2018-12-28 DIAGNOSIS — E039 Hypothyroidism, unspecified: Secondary | ICD-10-CM

## 2018-12-28 NOTE — Progress Notes (Addendum)
Patient ID: Kim Myers, female   DOB: 1990/09/25, 28 y.o.   MRN: [id number]   Patient location: Home My location: Office  I connected with the patient on 12/28/18 at  1:57 PM EDT by a video enabled telemedicine application and verified that I am speaking with the correct person.   I discussed the limitations of evaluation and management by telemedicine and the availability of in person appointments. The patient expressed understanding and agreed to proceed.   Details of the encounter are shown below.   HPI  Kim Myers is a 28 y.o.-year-old female, initially referred by her Joycelyn Man, MD, for management of amenorrhea and hypothyroidism.  She moved to [place] from [place], [place].  Last visit 3 months ago.  Transient hypothyroidism:  Her hypothyroidism resolved from last check: Lab Results  Component Value Date   TSH 1.04 02/27/2018   TSH 6.270 (H) 12/22/2017   FREET4 0.78 02/27/2018   FREET4 0.98 12/22/2017   T3FREE 2.7 02/27/2018   T3FREE 1.8 (L) 12/22/2017   Her thyroid antibodies were negative: Component     Latest Ref Rng & Units 02/27/2018  Thyroglobulin Ab     < or = 1 IU/mL <1  Thyroperoxidase Ab SerPl-aCnc     <9 IU/mL <1   She has no FH of thyroid disorders. No FH of thyroid cancer. No h/o radiation tx to head or neck.  No seaweed or kelp. No recent contrast studies. No herbal supplements. No Biotin use. No recent steroids use.   Amenorrhea:  Menarche: 28 to 28 years old Menses were normal after 2017 (they decreased in frequency after she started to workout more) -she also lost weight: 140s >> 120s. She was started on Provera tablets after she had the above labs drawn.  This did not work.  At her first visit, she was telling me that she was not not eating gluten or refined sugars, eats goat cheese, humus, fresh veggies, large amounts cabbage + broccoli >> stopped after her TFTs returned abnormal in the past.  At last visit, she was  telling me that she started to work with a counselor Kim Myers) and became more open with her dietary restrictions.  She works very hard on improving her food restriction and she gained weight before our last visit.  She reintroduced some snacks and had a weight gain of 5 pounds.  In the past, she was doing CrossFit, and intense running for 5/7 days, lifting weight  For 5 years, then she slowed down 2 months before last appointment. She is currently exercising 30 minutes to an hour every day: - walking - biking Prev: - running  - yoga - stand up paddle boarding - rowing  Reviewed her previous labs: - Mild anemia: hemoglobin 11 (11.1-15.9), normal MCV - Vitamin D deficiency: Vitamin D 15 - Hyponatremia: Sodium 127 (134-144), for a glucose of 86.  Chloride was also low, at 90 (96-106) - Elevated total cholesterol and LDL (201/104) - Low alkaline phosphatase of 37 (39-117) - Low estradiol <5.0, with inappropriately normal FSH.  However, she had normal prolactin, kidney function, B12, ferritin, folate. She was tested for celiac ds (EGD): negative - 2017.  02/27/2018: Normal testosterone, low estradiol, normal LH and FSH, Normal 17 hydroxyprogesterone Component     Latest Ref Rng & Units 02/27/2018 03/17/2018  Testosterone     8 - 48 ng/dL 44   Testosterone Free     0.0 - 4.2 pg/mL 4.1   Sex Horm Binding  Glob, Serum     24.6 - 122.0 nmol/L 71.4   Estradiol, Free     pg/mL 0.13   Estradiol     pg/mL 7   LH     mIU/mL 0.17   FSH     mIU/ML 2.9   Cortisol, Plasma     ug/dL  91.422.4  [id number] ACTH     6 - 50 pg/mL  60 (H)  DHEA-Sulfate, LCMS     ug/dL  562522 (H)   Patient was called back for cosyntropin stimulation test to rule out adrenal insufficiency.  Her cortisol levels were slightly high from the beginning and they did not increase much.  Moreover, her ACTH level was elevated. Component     Latest Ref Rng & Units 03/17/2018 03/17/2018 03/17/2018         8:33 AM  9:12 AM  9:44 AM   Cortisol, Plasma     ug/dL 13.022.4 86.524.1 78.425.2  [id number] ACTH     6 - 50 pg/mL 60 (H)     Her DHEAS was high, in With her high ACTH: Component     Latest Ref Rng & Units 03/17/2018  DHEA-Sulfate, LCMS     ug/dL 952522 (H)   Dexamethasone suppression test showed a nonsuppressed cortisol level, however, the dexamethasone level was lower than expected, so patient is likely a fast metabolizer.  In this situation, the DFT test is uninterpretable. Component     Latest Ref Rng & Units 04/01/2018  Dexamethasone, Blood     ng/dL 47  Cortisol     ug/dL 84.117.6   At last visit we checked the following tests: Component     Latest Ref Rng & Units 08/26/2018          Estradiol, Free     pg/mL 0.21  Estradiol     pg/mL 13  FSH     mIU/ML 5.9  LH     mIU/mL 0.82  Cortisol, Plasma     ug/dL 32.416.2  [id number] ACTH     6 - 50 pg/mL 31  DHEA-Sulfate, LCMS     ug/dL 272448 (H)  DHEAS was still elevated, but improved.  ACTH and cortisol will also improved.  LH, FSH, estradiol were improved, but this showed that were still low Component     Latest Ref Rng & Units 12/22/2017 02/27/2018 08/26/2018  Estradiol, Free     pg/mL  0.13 0.21  Estradiol     pg/mL <5.0 7 13  FSH     mIU/ML 4.1 2.9 5.9  LH     mIU/mL  0.17 0.82    24-hour urine cortisol was high, although I suspect that this was due to dietary restriction so with plans to repeat another collection at this visit.  If still elevated, may need a pituitary MRI. Component     Latest Ref Rng & Units 09/01/2018  24 Hour urine volume (VMAHVA)     mL 3,500  Cortisol (Ur), Free     4.0 - 50.0 mcg/24 h 98.6 (H)  Results received     0.50 - 2.15 g/24 h 1.44   She continues on iron and vitamin D.  ROS: Constitutional: + weight gain/no weight loss, no fatigue, no subjective hyperthermia, no subjective hypothermia Eyes: no blurry vision, no xerophthalmia ENT: no sore throat, no nodules palpated in neck, no dysphagia, no odynophagia, no hoarseness Cardiovascular:  no CP/no SOB/no palpitations/no leg swelling Respiratory: no cough/no SOB/no wheezing Gastrointestinal: no N/no V/no D/no C/no acid  reflux Musculoskeletal: no muscle aches/no joint aches Skin: no rashes, no hair loss Neurological: no tremors/no numbness/no tingling/no dizziness  I reviewed pt's medications, allergies, PMH, social hx, family hx, and changes were documented in the history of present illness. Otherwise, unchanged from my initial visit note.  Past Medical History:  Diagnosis Date  . Amenorrhea   . Blood transfusion without reported diagnosis   . Gluten intolerance   . History of anemia    resolved with dietary changes  . Hypothyroidism (acquired) 03/25/2018   Past Surgical History:  Procedure Laterality Date  . MECKEL DIVERTICULUM EXCISION  at 19   started as laparoscopy, ended in laparotomy   Social History   Socioeconomic History  . Marital status: Single    Spouse name: Not on file  . Number of children: 0  . Years of education: Not on file  . Highest education level: Not on file  Occupational History  .  Barista  Tobacco Use  . Smoking status: Never Smoker  . Smokeless tobacco: Never Used  Substance and Sexual Activity  . Alcohol use: No  . Drug use: No  . Sexual activity: Not Currently    Birth control/protection:  She is not sexually active right now   Current Outpatient Medications on File Prior to Visit  Medication Sig Dispense Refill  . Cholecalciferol (VITAMIN D3) 2000 units TABS Take 1 tablet by mouth daily.    Marland Kitchen. dexamethasone (DECADRON) 1 MG tablet Take 1 tablet by mouth once at 11 pm, before coming for labs at 8 am the next morning (Patient not taking: Reported on 08/25/2018) 1 tablet 0  . IRON PO Take 1 tablet by mouth daily.    . Multiple Vitamin (MULTIVITAMIN) tablet Take 1 tablet by mouth daily.     No current facility-administered medications on file prior to visit.    Allergies  Allergen Reactions  . Gluten Meal Diarrhea, Nausea And  Vomiting and Other (See Comments)    Caused internal bleeding that resulted in hospitalization!!   Family History  Problem Relation Age of Onset  . Breast cancer Paternal Grandmother   . Ovarian cancer Maternal Grandmother     PE: There were no vitals taken for this visit. Wt Readings from Last 3 Encounters:  08/25/18 131 lb (59.4 kg)  08/07/18 130 lb (59 kg)  04/06/18 126 lb (57.2 kg)   Constitutional:  in NAD  The physical exam was not performed (virtual visit).  ASSESSMENT: 1. Hypothyroidism  2.  Hypogonadotropic hypogonadism/hypothalamic amenorrhea  3.  Elevated urinary cortisol  PLAN:  1. Patient with history of mild hypothyroidism in the past, with TFTs normalized at last check.  She also had normal antithyroid and TPO antibodies. -We will recheck her TFTs at next visit to the clinic to make sure they remain normal  2.  Hypogonadotropic hypogonadism -Patient with a history of regular menstrual cycles until approximately 3.5 years ago when they became less frequent and finally stopped.  At that time, she was exercising intensively and she also lost 20 pounds from 140 to 120 pounds).  Labs checked by OB/GYN showed a low estradiol. -We investigated her further by ruling out PCOS (normal testosterone), [place]-CAH (normal 17 hydroxyprogesterone), prolactinoma (normal prolactin level), hypo-or hyperthyroidism (normal TFTs and normal thyroid antibodies).  She did have a normal cortisol level but an elevated ACTH and DHEAS at that time.  We checked a cosyntropin stimulation test and this showed normal, but unchanged cortisol from 0 to 60 minutes.  A dexamethasone  suppression test was not accurate for her since she was a fast dexamethasone metabolizer.  A 24-hour urine cortisol showed elevated cortisol level.  She does not have any signs or symptoms of Cushing's syndrome.  Her estrogen was low but improving.  LH and FSH were normal. -At last visit, she shared with me that she was  restricting eating and exercising excessively in the past but cut down on exercise and started to work with a therapist before last visit, after which she started to gain weight and feel better.  We discussed that the above changes can be seen in the setting of anorexia/hypothalamic amenorrhea and we decided to continue to work on her weight gain and to possibly reduce exercise even further.  We did not start her on hormone replacement/OCPs since she was doing better, with improved calorie balance and improving labs. -At this visit, she tells me that she gained a little bit more weight and she is now only walking and biking for exercise.  She started to get some symptoms around the time of her menstrual cycles including some cramping and also she had a light discharge, although not a regular menstrual cycle recently.  These are all good signs.  I encouraged her to continue what she is doing and she may even need to get back to 140 pounds and maintain this weight for at least 6 months before she gets a full return of her menstrual cycles. -At this visit, we will have her back for hormonal investigation including a 24-hour urine (although pathologic increase in cortisol is unlikely-she needs to come back fasting, between 8 and 9 in the morning. -If labs are not improving further, she may need pituitary MRI and low-dose OCP versus HRT. Orders Placed This Encounter  Procedures  . Cortisol, Free 24 HR Urine  . Creatinine, urine, 24 hour  . DHEA-Sulfate, Serum  . Luteinizing hormone  . Estradiol  . ACTH  . Cortisol  . T4, free  . T3, free  . TSH  -I would see her back in 6 months  3.  Elevated urinary cortisol -She had 1 instance of an elevated 24-hour urine cortisol at last visit in 08/2018.  This was most likely due to dietary restriction in the past this patient should be seen she does not have any physical manifestation of Cushing syndrome. -We will recheck her 24-hour urine cortisol when she  returns to the clinic -If still elevated, will most likely need a pituitary MRI  Component     Latest Ref Rng & Units 12/29/2018          Estradiol     pg/mL 23  TSH     0.35 - 4.50 uIU/mL 0.85  T4,Free(Direct)     0.60 - 1.60 ng/dL 1.61  Triiodothyronine,Free,Serum     2.3 - 4.2 pg/mL 2.7  LH     mIU/mL 5.53  Cortisol, Plasma     ug/dL 09.6  E454 ACTH     6 - 50 pg/mL 18  DHEA-Sulfate, LCMS     ug/dL 098   Tests are excellent, with normal TFTs, normal cortisol and ACTH, also DHEAS now normal.  Estradiol is still slightly low but improved compared to before.  Component     Latest Ref Rng & Units 12/31/2018  Total Volume     mL 6,050  Cortisol (Ur), Free     4.0 - 50.0 mcg/24 h 35.7  Cortisol, Free ratio to CRT     mcg/g creat 19.8  Results received     0.50 - 2.15 g/24 h 1.80   24-hour urine cortisol is now normal.  We will continue to follow her expectantly for now.  Carlus Pavlovristina Mechille Varghese, MD PhD [facility]

## 2018-12-29 ENCOUNTER — Other Ambulatory Visit: Payer: Self-pay

## 2018-12-29 ENCOUNTER — Other Ambulatory Visit (INDEPENDENT_AMBULATORY_CARE_PROVIDER_SITE_OTHER): Payer: BC Managed Care – PPO

## 2018-12-29 DIAGNOSIS — R82998 Other abnormal findings in urine: Secondary | ICD-10-CM | POA: Diagnosis not present

## 2018-12-29 DIAGNOSIS — E039 Hypothyroidism, unspecified: Secondary | ICD-10-CM

## 2018-12-29 DIAGNOSIS — N912 Amenorrhea, unspecified: Secondary | ICD-10-CM | POA: Diagnosis not present

## 2018-12-29 LAB — TSH: TSH: 0.85 u[IU]/mL (ref 0.35–4.50)

## 2018-12-29 LAB — T4, FREE: Free T4: 0.61 ng/dL (ref 0.60–1.60)

## 2018-12-29 LAB — LUTEINIZING HORMONE: LH: 5.53 m[IU]/mL

## 2018-12-29 LAB — CORTISOL: Cortisol, Plasma: 14.4 ug/dL

## 2018-12-29 LAB — T3, FREE: T3, Free: 2.7 pg/mL (ref 2.3–4.2)

## 2018-12-30 DIAGNOSIS — D2239 Melanocytic nevi of other parts of face: Secondary | ICD-10-CM | POA: Diagnosis not present

## 2018-12-30 DIAGNOSIS — L821 Other seborrheic keratosis: Secondary | ICD-10-CM | POA: Diagnosis not present

## 2018-12-30 DIAGNOSIS — D485 Neoplasm of uncertain behavior of skin: Secondary | ICD-10-CM | POA: Diagnosis not present

## 2018-12-30 DIAGNOSIS — D225 Melanocytic nevi of trunk: Secondary | ICD-10-CM | POA: Diagnosis not present

## 2018-12-31 ENCOUNTER — Other Ambulatory Visit: Payer: Self-pay

## 2018-12-31 ENCOUNTER — Other Ambulatory Visit: Payer: BC Managed Care – PPO

## 2018-12-31 DIAGNOSIS — R82998 Other abnormal findings in urine: Secondary | ICD-10-CM

## 2018-12-31 DIAGNOSIS — F419 Anxiety disorder, unspecified: Secondary | ICD-10-CM | POA: Diagnosis not present

## 2019-01-01 LAB — ESTRADIOL: Estradiol: 23 pg/mL

## 2019-01-01 LAB — EXTRA SPECIMEN

## 2019-01-01 LAB — ACTH: C206 ACTH: 18 pg/mL (ref 6–50)

## 2019-01-01 LAB — ESTRADIOL, FREE

## 2019-01-02 LAB — DHEA-SULFATE, SERUM: DHEA-Sulfate, LCMS: 275 ug/dL

## 2019-01-05 LAB — CORTISOL, FREE 24 HR URINE
Cortisol (Ur), Free: 35.7 mcg/24 h (ref 4.0–50.0)
Cortisol, Free ratio to CRT: 19.8 mcg/g creat
RESULTS RECEIVED: 1.8 g/(24.h) (ref 0.50–2.15)
Total Volume: 6050 mL

## 2019-01-05 LAB — CREATININE, URINE, 24 HOUR: Creatinine, 24H Ur: 1.88 g/(24.h) (ref 0.50–2.15)

## 2019-01-05 LAB — EXTRA URINE SPECIMEN

## 2019-01-21 DIAGNOSIS — F419 Anxiety disorder, unspecified: Secondary | ICD-10-CM | POA: Diagnosis not present

## 2019-02-04 DIAGNOSIS — F419 Anxiety disorder, unspecified: Secondary | ICD-10-CM | POA: Diagnosis not present

## 2019-02-19 DIAGNOSIS — F419 Anxiety disorder, unspecified: Secondary | ICD-10-CM | POA: Diagnosis not present

## 2019-03-05 DIAGNOSIS — F419 Anxiety disorder, unspecified: Secondary | ICD-10-CM | POA: Diagnosis not present

## 2019-03-15 DIAGNOSIS — Z20828 Contact with and (suspected) exposure to other viral communicable diseases: Secondary | ICD-10-CM | POA: Diagnosis not present

## 2019-03-23 DIAGNOSIS — Z20828 Contact with and (suspected) exposure to other viral communicable diseases: Secondary | ICD-10-CM | POA: Diagnosis not present

## 2019-03-25 DIAGNOSIS — F419 Anxiety disorder, unspecified: Secondary | ICD-10-CM | POA: Diagnosis not present

## 2019-03-26 DIAGNOSIS — F419 Anxiety disorder, unspecified: Secondary | ICD-10-CM | POA: Diagnosis not present

## 2019-04-14 ENCOUNTER — Emergency Department (HOSPITAL_COMMUNITY): Payer: BC Managed Care – PPO

## 2019-04-14 ENCOUNTER — Other Ambulatory Visit: Payer: Self-pay

## 2019-04-14 ENCOUNTER — Inpatient Hospital Stay (HOSPITAL_COMMUNITY): Payer: BC Managed Care – PPO

## 2019-04-14 ENCOUNTER — Encounter (HOSPITAL_COMMUNITY): Payer: Self-pay | Admitting: Emergency Medicine

## 2019-04-14 ENCOUNTER — Inpatient Hospital Stay (HOSPITAL_COMMUNITY)
Admission: EM | Admit: 2019-04-14 | Discharge: 2019-04-30 | DRG: 329 | Disposition: A | Discharge status: Home / Self Care | Payer: BC Managed Care – PPO | Attending: Internal Medicine | Admitting: Internal Medicine

## 2019-04-14 ENCOUNTER — Other Ambulatory Visit (HOSPITAL_COMMUNITY): Payer: BC Managed Care – PPO

## 2019-04-14 DIAGNOSIS — R188 Other ascites: Secondary | ICD-10-CM | POA: Diagnosis present

## 2019-04-14 DIAGNOSIS — Z0189 Encounter for other specified special examinations: Secondary | ICD-10-CM

## 2019-04-14 DIAGNOSIS — E233 Hypothalamic dysfunction, not elsewhere classified: Secondary | ICD-10-CM | POA: Diagnosis not present

## 2019-04-14 DIAGNOSIS — Z91018 Allergy to other foods: Secondary | ICD-10-CM

## 2019-04-14 DIAGNOSIS — Z8041 Family history of malignant neoplasm of ovary: Secondary | ICD-10-CM

## 2019-04-14 DIAGNOSIS — N179 Acute kidney failure, unspecified: Secondary | ICD-10-CM | POA: Diagnosis not present

## 2019-04-14 DIAGNOSIS — K9041 Non-celiac gluten sensitivity: Secondary | ICD-10-CM | POA: Diagnosis present

## 2019-04-14 DIAGNOSIS — K650 Generalized (acute) peritonitis: Secondary | ICD-10-CM | POA: Diagnosis not present

## 2019-04-14 DIAGNOSIS — K529 Noninfective gastroenteritis and colitis, unspecified: Secondary | ICD-10-CM | POA: Diagnosis not present

## 2019-04-14 DIAGNOSIS — I313 Pericardial effusion (noninflammatory): Secondary | ICD-10-CM | POA: Diagnosis not present

## 2019-04-14 DIAGNOSIS — R630 Anorexia: Secondary | ICD-10-CM | POA: Diagnosis present

## 2019-04-14 DIAGNOSIS — K219 Gastro-esophageal reflux disease without esophagitis: Secondary | ICD-10-CM | POA: Diagnosis not present

## 2019-04-14 DIAGNOSIS — Z23 Encounter for immunization: Secondary | ICD-10-CM | POA: Diagnosis not present

## 2019-04-14 DIAGNOSIS — Z20828 Contact with and (suspected) exposure to other viral communicable diseases: Secondary | ICD-10-CM | POA: Diagnosis present

## 2019-04-14 DIAGNOSIS — A419 Sepsis, unspecified organism: Secondary | ICD-10-CM

## 2019-04-14 DIAGNOSIS — K651 Peritoneal abscess: Secondary | ICD-10-CM

## 2019-04-14 DIAGNOSIS — K562 Volvulus: Principal | ICD-10-CM | POA: Diagnosis present

## 2019-04-14 DIAGNOSIS — E876 Hypokalemia: Secondary | ICD-10-CM | POA: Diagnosis not present

## 2019-04-14 DIAGNOSIS — K56609 Unspecified intestinal obstruction, unspecified as to partial versus complete obstruction: Secondary | ICD-10-CM

## 2019-04-14 DIAGNOSIS — E860 Dehydration: Secondary | ICD-10-CM | POA: Diagnosis present

## 2019-04-14 DIAGNOSIS — Z95828 Presence of other vascular implants and grafts: Secondary | ICD-10-CM | POA: Diagnosis not present

## 2019-04-14 DIAGNOSIS — Z9889 Other specified postprocedural states: Secondary | ICD-10-CM | POA: Diagnosis not present

## 2019-04-14 DIAGNOSIS — N390 Urinary tract infection, site not specified: Secondary | ICD-10-CM | POA: Diagnosis not present

## 2019-04-14 DIAGNOSIS — N320 Bladder-neck obstruction: Secondary | ICD-10-CM | POA: Diagnosis present

## 2019-04-14 DIAGNOSIS — Z9049 Acquired absence of other specified parts of digestive tract: Secondary | ICD-10-CM | POA: Diagnosis not present

## 2019-04-14 DIAGNOSIS — E230 Hypopituitarism: Secondary | ICD-10-CM | POA: Diagnosis not present

## 2019-04-14 DIAGNOSIS — K567 Ileus, unspecified: Secondary | ICD-10-CM | POA: Diagnosis not present

## 2019-04-14 DIAGNOSIS — K5289 Other specified noninfective gastroenteritis and colitis: Secondary | ICD-10-CM | POA: Diagnosis not present

## 2019-04-14 DIAGNOSIS — R946 Abnormal results of thyroid function studies: Secondary | ICD-10-CM | POA: Diagnosis not present

## 2019-04-14 DIAGNOSIS — E872 Acidosis: Secondary | ICD-10-CM | POA: Diagnosis present

## 2019-04-14 DIAGNOSIS — R7989 Other specified abnormal findings of blood chemistry: Secondary | ICD-10-CM

## 2019-04-14 DIAGNOSIS — Z03818 Encounter for observation for suspected exposure to other biological agents ruled out: Secondary | ICD-10-CM | POA: Diagnosis not present

## 2019-04-14 DIAGNOSIS — N912 Amenorrhea, unspecified: Secondary | ICD-10-CM | POA: Diagnosis present

## 2019-04-14 DIAGNOSIS — K55029 Acute infarction of small intestine, extent unspecified: Secondary | ICD-10-CM | POA: Diagnosis not present

## 2019-04-14 DIAGNOSIS — E288 Other ovarian dysfunction: Secondary | ICD-10-CM | POA: Diagnosis not present

## 2019-04-14 DIAGNOSIS — F5000 Anorexia nervosa, unspecified: Secondary | ICD-10-CM

## 2019-04-14 DIAGNOSIS — E878 Other disorders of electrolyte and fluid balance, not elsewhere classified: Secondary | ICD-10-CM | POA: Diagnosis not present

## 2019-04-14 DIAGNOSIS — R6510 Systemic inflammatory response syndrome (SIRS) of non-infectious origin without acute organ dysfunction: Secondary | ICD-10-CM | POA: Diagnosis not present

## 2019-04-14 DIAGNOSIS — R6520 Severe sepsis without septic shock: Secondary | ICD-10-CM | POA: Diagnosis not present

## 2019-04-14 DIAGNOSIS — N3001 Acute cystitis with hematuria: Secondary | ICD-10-CM | POA: Diagnosis present

## 2019-04-14 DIAGNOSIS — R109 Unspecified abdominal pain: Secondary | ICD-10-CM | POA: Diagnosis not present

## 2019-04-14 DIAGNOSIS — D62 Acute posthemorrhagic anemia: Secondary | ICD-10-CM | POA: Diagnosis present

## 2019-04-14 DIAGNOSIS — I959 Hypotension, unspecified: Secondary | ICD-10-CM | POA: Diagnosis present

## 2019-04-14 DIAGNOSIS — E871 Hypo-osmolality and hyponatremia: Secondary | ICD-10-CM | POA: Diagnosis not present

## 2019-04-14 DIAGNOSIS — I96 Gangrene, not elsewhere classified: Secondary | ICD-10-CM | POA: Diagnosis present

## 2019-04-14 DIAGNOSIS — Z8719 Personal history of other diseases of the digestive system: Secondary | ICD-10-CM | POA: Diagnosis not present

## 2019-04-14 DIAGNOSIS — Z8659 Personal history of other mental and behavioral disorders: Secondary | ICD-10-CM

## 2019-04-14 DIAGNOSIS — J95821 Acute postprocedural respiratory failure: Secondary | ICD-10-CM | POA: Diagnosis not present

## 2019-04-14 DIAGNOSIS — K922 Gastrointestinal hemorrhage, unspecified: Secondary | ICD-10-CM | POA: Diagnosis not present

## 2019-04-14 DIAGNOSIS — E039 Hypothyroidism, unspecified: Secondary | ICD-10-CM | POA: Diagnosis not present

## 2019-04-14 DIAGNOSIS — R0902 Hypoxemia: Secondary | ICD-10-CM | POA: Diagnosis not present

## 2019-04-14 DIAGNOSIS — R509 Fever, unspecified: Secondary | ICD-10-CM | POA: Diagnosis not present

## 2019-04-14 DIAGNOSIS — Z6821 Body mass index (BMI) 21.0-21.9, adult: Secondary | ICD-10-CM

## 2019-04-14 DIAGNOSIS — R112 Nausea with vomiting, unspecified: Secondary | ICD-10-CM | POA: Diagnosis not present

## 2019-04-14 DIAGNOSIS — R000 Tachycardia, unspecified: Secondary | ICD-10-CM | POA: Diagnosis not present

## 2019-04-14 DIAGNOSIS — K30 Functional dyspepsia: Secondary | ICD-10-CM | POA: Diagnosis present

## 2019-04-14 DIAGNOSIS — N911 Secondary amenorrhea: Secondary | ICD-10-CM | POA: Diagnosis not present

## 2019-04-14 DIAGNOSIS — D649 Anemia, unspecified: Secondary | ICD-10-CM

## 2019-04-14 DIAGNOSIS — Z4682 Encounter for fitting and adjustment of non-vascular catheter: Secondary | ICD-10-CM | POA: Diagnosis not present

## 2019-04-14 DIAGNOSIS — K660 Peritoneal adhesions (postprocedural) (postinfection): Secondary | ICD-10-CM | POA: Diagnosis present

## 2019-04-14 DIAGNOSIS — N261 Atrophy of kidney (terminal): Secondary | ICD-10-CM | POA: Diagnosis present

## 2019-04-14 DIAGNOSIS — N2881 Hypertrophy of kidney: Secondary | ICD-10-CM | POA: Diagnosis present

## 2019-04-14 DIAGNOSIS — Z803 Family history of malignant neoplasm of breast: Secondary | ICD-10-CM

## 2019-04-14 DIAGNOSIS — J9600 Acute respiratory failure, unspecified whether with hypoxia or hypercapnia: Secondary | ICD-10-CM

## 2019-04-14 DIAGNOSIS — D72829 Elevated white blood cell count, unspecified: Secondary | ICD-10-CM | POA: Diagnosis not present

## 2019-04-14 DIAGNOSIS — K565 Intestinal adhesions [bands], unspecified as to partial versus complete obstruction: Secondary | ICD-10-CM | POA: Diagnosis not present

## 2019-04-14 DIAGNOSIS — J9601 Acute respiratory failure with hypoxia: Secondary | ICD-10-CM | POA: Diagnosis not present

## 2019-04-14 DIAGNOSIS — Q430 Meckel's diverticulum (displaced) (hypertrophic): Secondary | ICD-10-CM

## 2019-04-14 DIAGNOSIS — J90 Pleural effusion, not elsewhere classified: Secondary | ICD-10-CM | POA: Diagnosis present

## 2019-04-14 DIAGNOSIS — B354 Tinea corporis: Secondary | ICD-10-CM | POA: Diagnosis not present

## 2019-04-14 DIAGNOSIS — Z978 Presence of other specified devices: Secondary | ICD-10-CM | POA: Diagnosis not present

## 2019-04-14 DIAGNOSIS — R1084 Generalized abdominal pain: Secondary | ICD-10-CM | POA: Diagnosis not present

## 2019-04-14 DIAGNOSIS — K5669 Other partial intestinal obstruction: Secondary | ICD-10-CM | POA: Diagnosis not present

## 2019-04-14 DIAGNOSIS — D509 Iron deficiency anemia, unspecified: Secondary | ICD-10-CM | POA: Diagnosis not present

## 2019-04-14 HISTORY — DX: Anemia, unspecified: D64.9

## 2019-04-14 LAB — URINALYSIS, ROUTINE W REFLEX MICROSCOPIC
Bilirubin Urine: NEGATIVE
Bilirubin Urine: NEGATIVE
Glucose, UA: NEGATIVE mg/dL
Glucose, UA: NEGATIVE mg/dL
Ketones, ur: NEGATIVE mg/dL
Ketones, ur: NEGATIVE mg/dL
Leukocytes,Ua: NEGATIVE
Nitrite: NEGATIVE
Nitrite: NEGATIVE
Protein, ur: 100 mg/dL — AB
Protein, ur: 30 mg/dL — AB
Specific Gravity, Urine: 1.02 (ref 1.005–1.030)
Specific Gravity, Urine: 1.019 (ref 1.005–1.030)
WBC, UA: >50 WBC/hpf — ABNORMAL HIGH (ref 0–5)
pH: 5 (ref 5.0–8.0)
pH: 5 (ref 5.0–8.0)

## 2019-04-14 LAB — COMPREHENSIVE METABOLIC PANEL
ALT: 20 U/L (ref 0–44)
AST: 30 U/L (ref 15–41)
Albumin: 3.2 g/dL — ABNORMAL LOW (ref 3.5–5.0)
Alkaline Phosphatase: 46 U/L (ref 38–126)
Anion gap: 18 — ABNORMAL HIGH (ref 5–15)
BUN: 67 mg/dL — ABNORMAL HIGH (ref 6–20)
CO2: 22 mmol/L (ref 22–32)
Calcium: 8.9 mg/dL (ref 8.9–10.3)
Chloride: 88 mmol/L — ABNORMAL LOW (ref 98–111)
Creatinine, Ser: 3.83 mg/dL — ABNORMAL HIGH (ref 0.44–1.00)
GFR calc Af Amer: 18 mL/min — ABNORMAL LOW (ref >60)
GFR calc non Af Amer: 15 mL/min — ABNORMAL LOW (ref >60)
Glucose, Bld: 113 mg/dL — ABNORMAL HIGH (ref 70–99)
Potassium: 4.7 mmol/L (ref 3.5–5.1)
Sodium: 128 mmol/L — ABNORMAL LOW (ref 135–145)
Total Bilirubin: 0.5 mg/dL (ref 0.3–1.2)
Total Protein: 6.9 g/dL (ref 6.5–8.1)

## 2019-04-14 LAB — CBC WITH DIFFERENTIAL/PLATELET
Abs Immature Granulocytes: 0 10*3/uL (ref 0.00–0.07)
Basophils Absolute: 0 10*3/uL (ref 0.0–0.1)
Basophils Relative: 0 %
Eosinophils Absolute: 0 10*3/uL (ref 0.0–0.5)
Eosinophils Relative: 0 %
HCT: 29.1 % — ABNORMAL LOW (ref 36.0–46.0)
Hemoglobin: 9.5 g/dL — ABNORMAL LOW (ref 12.0–15.0)
Lymphocytes Relative: 8 %
Lymphs Abs: 0.9 10*3/uL (ref 0.7–4.0)
MCH: 27 pg (ref 26.0–34.0)
MCHC: 32.6 g/dL (ref 30.0–36.0)
MCV: 82.7 fL (ref 80.0–100.0)
Monocytes Absolute: 0.7 10*3/uL (ref 0.1–1.0)
Monocytes Relative: 6 %
Neutro Abs: 9.5 10*3/uL — ABNORMAL HIGH (ref 1.7–7.7)
Neutrophils Relative %: 86 %
Platelets: 458 10*3/uL — ABNORMAL HIGH (ref 150–400)
RBC: 3.52 MIL/uL — ABNORMAL LOW (ref 3.87–5.11)
RDW: 13 % (ref 11.5–15.5)
WBC: 11.1 10*3/uL — ABNORMAL HIGH (ref 4.0–10.5)
nRBC: 0 % (ref 0.0–0.2)
nRBC: 0 /100 WBC

## 2019-04-14 LAB — PHOSPHORUS: Phosphorus: 4.7 mg/dL — ABNORMAL HIGH (ref 2.5–4.6)

## 2019-04-14 LAB — POC OCCULT BLOOD, ED: Fecal Occult Bld: NEGATIVE

## 2019-04-14 LAB — SARS CORONAVIRUS 2 (TAT 6-24 HRS): SARS Coronavirus 2: NEGATIVE

## 2019-04-14 LAB — PROTIME-INR
INR: 1.3 — ABNORMAL HIGH (ref 0.8–1.2)
Prothrombin Time: 15.9 seconds — ABNORMAL HIGH (ref 11.4–15.2)

## 2019-04-14 LAB — APTT: aPTT: 34 seconds (ref 24–36)

## 2019-04-14 LAB — TSH: TSH: 4.97 u[IU]/mL — ABNORMAL HIGH (ref 0.350–4.500)

## 2019-04-14 LAB — I-STAT BETA HCG BLOOD, ED (MC, WL, AP ONLY): I-stat hCG, quantitative: <5 m[IU]/mL (ref <5)

## 2019-04-14 LAB — POC URINE PREG, ED: Preg Test, Ur: NEGATIVE

## 2019-04-14 LAB — MAGNESIUM: Magnesium: 2.3 mg/dL (ref 1.7–2.4)

## 2019-04-14 LAB — T4, FREE: Free T4: 1.32 ng/dL — ABNORMAL HIGH (ref 0.61–1.12)

## 2019-04-14 LAB — LIPASE, BLOOD: Lipase: 18 U/L (ref 11–51)

## 2019-04-14 LAB — LACTIC ACID, PLASMA: Lactic Acid, Venous: 1.8 mmol/L (ref 0.5–1.9)

## 2019-04-14 MED ORDER — ONDANSETRON HCL 4 MG/2ML IJ SOLN
4.0000 mg | Freq: Four times a day (QID) | INTRAMUSCULAR | Status: DC | PRN
  Administered 2019-04-14 – 2019-04-22 (×3): 4 mg via INTRAVENOUS
  Filled 2019-04-14 (×3): qty 2

## 2019-04-14 MED ORDER — LACTATED RINGERS IV BOLUS
1000.0000 mL | Freq: Once | INTRAVENOUS | Status: AC
  Administered 2019-04-14: 1000 mL via INTRAVENOUS

## 2019-04-14 MED ORDER — ENOXAPARIN SODIUM 30 MG/0.3ML ~~LOC~~ SOLN
30.0000 mg | SUBCUTANEOUS | Status: DC
  Filled 2019-04-14: qty 0.3

## 2019-04-14 MED ORDER — HYDROMORPHONE HCL 1 MG/ML IJ SOLN
0.2500 mg | INTRAMUSCULAR | Status: AC | PRN
  Administered 2019-04-14 – 2019-04-15 (×3): 0.25 mg via INTRAVENOUS
  Filled 2019-04-14 (×3): qty 1

## 2019-04-14 MED ORDER — ACETAMINOPHEN 650 MG RE SUPP
650.0000 mg | Freq: Four times a day (QID) | RECTAL | Status: DC | PRN
  Administered 2019-04-15 – 2019-04-20 (×3): 650 mg via RECTAL
  Filled 2019-04-14 (×3): qty 1

## 2019-04-14 MED ORDER — PHENOL 1.4 % MT LIQD: 1.0000 | OROMUCOSAL | Status: DC | PRN

## 2019-04-14 MED ORDER — SODIUM CHLORIDE 0.9 % IV BOLUS (SEPSIS)
1000.0000 mL | Freq: Once | INTRAVENOUS | Status: AC
  Administered 2019-04-14: 1000 mL via INTRAVENOUS

## 2019-04-14 MED ORDER — SODIUM CHLORIDE 0.9 % IV SOLN
INTRAVENOUS | Status: DC
  Administered 2019-04-14 – 2019-04-19 (×13): via INTRAVENOUS

## 2019-04-14 MED ORDER — PROSIGHT PO TABS
1.0000 | ORAL_TABLET | Freq: Every day | ORAL | Status: DC
  Administered 2019-04-16 – 2019-04-19 (×4): 1 via ORAL
  Filled 2019-04-14 (×5): qty 1

## 2019-04-14 MED ORDER — HYDROMORPHONE HCL 1 MG/ML IJ SOLN
0.5000 mg | Freq: Once | INTRAMUSCULAR | Status: AC
  Administered 2019-04-14: 0.5 mg via INTRAVENOUS
  Filled 2019-04-14: qty 1

## 2019-04-14 MED ORDER — SODIUM CHLORIDE 0.9 % IV SOLN
1.0000 g | INTRAVENOUS | Status: DC
  Administered 2019-04-14: 1 g via INTRAVENOUS
  Filled 2019-04-14 (×2): qty 10

## 2019-04-14 MED ORDER — FOLIC ACID 1 MG PO TABS
1.0000 mg | ORAL_TABLET | Freq: Every day | ORAL | Status: DC
  Administered 2019-04-16 – 2019-04-19 (×4): 1 mg via ORAL
  Filled 2019-04-14 (×4): qty 1

## 2019-04-14 MED ORDER — FENTANYL CITRATE (PF) 100 MCG/2ML IJ SOLN
50.0000 ug | Freq: Once | INTRAMUSCULAR | Status: AC
  Administered 2019-04-14: 50 ug via INTRAVENOUS
  Filled 2019-04-14: qty 2

## 2019-04-14 MED ORDER — ACETAMINOPHEN 325 MG PO TABS
650.0000 mg | ORAL_TABLET | Freq: Four times a day (QID) | ORAL | Status: DC | PRN
  Administered 2019-04-19: 650 mg via ORAL
  Filled 2019-04-14: qty 2

## 2019-04-14 MED ORDER — ONDANSETRON HCL 4 MG/2ML IJ SOLN
4.0000 mg | Freq: Once | INTRAMUSCULAR | Status: AC
  Administered 2019-04-14: 4 mg via INTRAVENOUS
  Filled 2019-04-14: qty 2

## 2019-04-14 MED ORDER — SODIUM CHLORIDE 0.9 % IV BOLUS
1000.0000 mL | Freq: Once | INTRAVENOUS | Status: AC
  Administered 2019-04-14: 1000 mL via INTRAVENOUS

## 2019-04-14 MED ORDER — VITAMIN B-1 100 MG PO TABS
100.0000 mg | ORAL_TABLET | Freq: Every day | ORAL | Status: DC
  Administered 2019-04-16 – 2019-04-19 (×4): 100 mg via ORAL
  Filled 2019-04-14 (×4): qty 1
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
## 2019-04-14 NOTE — ED Notes (Signed)
Called Admitting Provider, pt to remain strictly NPO until further notice.

## 2019-04-14 NOTE — ED Notes (Addendum)
Pt bladder scanned. Highest amount scanned 524 ml.

## 2019-04-14 NOTE — ED Triage Notes (Signed)
Patient states starting Sunday having severer mid abdominal pain with emesis. Reports for past 2 days having dark brown emesis. No BM since Sunday. Abdomen is bloated and patient appears pale.

## 2019-04-14 NOTE — ED Notes (Signed)
Pt POC preg is negative. Results not crossing over. CT aware.

## 2019-04-14 NOTE — ED Notes (Signed)
Pt ambulatory to restroom with one person assist for safety

## 2019-04-14 NOTE — Consult Note (Addendum)
Kim Myers 1991/05/01  [id number].    Requesting MD: Domenic Moras, PA-C Chief Complaint/Reason for Consult: Possible SBO  HPI: Kim Myers is a 28 y.o. female presented to [facility] today for abdominal pain, nausea and vomiting.  Patient reports that on Sunday she began having a gradual onset of epigastric abdominal pain that she describes as shooting, stabbing and pressure.  She reports that her pain has waxed and waned since onset.  She reports since Sunday she has had nausea as well as greater than 20 episodes of dark brown emesis per day.  Her last episode of emesis was around 6 AM this morning.  She reports that she has had very little oral intake besides sips of water, tea and soup since onset of her symptoms secondary to the pain and her nausea.  She reports that yesterday her pain began to move from her epigastrium down to her suprapubic abdomen.  She reports over the last 24 hours she has had difficulty making urine and when she does it is painful and dark.  Patient reports that her last episode of flatus was on Monday.  She did have one small, normal BM for her on Monday as well.  She has had associated fever and chills. She also notes some sob. Patient denies history of similar symptoms like this in the past.  Patient has a history of a Dx Laparoscopy, ex-laparotomy with Meckel's diverticulectomy, LOA and appedectomy Dr. Harlow Asa in 2011 for a small bowel volvulus, Meckel's diverticulum and incomplete bowel rotration.  She did suffer a liver laceration that was treated nonoperatively in 2018. Denies contacts with persons with similar symptoms, ingestion of suspect foods or water, history of similar symptoms, or recent foreign travel.  In the ED patient was noted to be febrile at 100.6, tachycardic 106-135, tachypneic, and with a soft BP of 99/62 on arrival.  Lab work was significant for WBC of 11,000. Lactic acid 1.8. She was noted to be in acute renal failure with a creatinine of 3.83.   Her UA was suspicious for UTI, however was a contaminated catch.  Her TSH was noted to be elevated at 4.97 and her T4 was also elevated at 1.32.  CT imaging showed diffusely dilated air filled loops of small bowel throughout the abdomen that could represent a enteritis with associated ileus versus SBO.  There is no clear transition point.  There was a small to moderate amount of ascites noted. The report shows hx of appendectomy? There is also a partially visualized anterior pericardial effusion and trace left and probably right pleural effusion.  No dedicated imaging of the chest has been obtained yet.  ROS: Review of Systems  Constitutional: Positive for chills, fever and malaise/fatigue.  Respiratory: Positive for shortness of breath.   Cardiovascular: Negative for chest pain and leg swelling.  Gastrointestinal: Positive for abdominal pain, constipation, nausea and vomiting. Negative for blood in stool and diarrhea.  Genitourinary: Positive for dysuria.  Musculoskeletal: Negative for myalgias.  Skin: Negative for itching and rash.  Neurological: Positive for dizziness. Negative for headaches.  All other systems reviewed and are negative.   Family History  Problem Relation Age of Onset  . Breast cancer Paternal Grandmother   . Ovarian cancer Maternal Grandmother     Past Medical History:  Diagnosis Date  . Amenorrhea   . Blood transfusion without reported diagnosis   . Gluten intolerance   . History of anemia    resolved with dietary changes  .  Hypothyroidism (acquired) 03/25/2018    Past Surgical History:  Procedure Laterality Date  . MECKEL DIVERTICULUM EXCISION  at 19   started as laparoscopy, ended in laparotomy    Social History:  reports that she has never smoked. She has never used smokeless tobacco. She reports that she does not drink alcohol or use drugs.  Occasional alcohol use. None recently No illicit drug use No tobacco use Mother was at bedside  Allergies:   Allergies  Allergen Reactions  . Gluten Meal Diarrhea, Nausea And Vomiting and Other (See Comments)    Caused internal bleeding that resulted in hospitalization!!    (Not in a hospital admission)    Physical Exam: Blood pressure 118/62, pulse (!) 106, temperature (!) 100.6 F (38.1 C), temperature source Rectal, resp. rate 19, height  (1.676 m), weight 59 kg, SpO2 100 %. General: pleasant, WD, WN female who is laying in bed in NAD HEENT: head is normocephalic, atraumatic.  Sclera are noninjected.  PERRL.  Ears and nose without any masses or lesions.  Mouth is dry Heart: Tachycardic with regular rhythm.  Normal s1,s2. No obvious murmurs, gallops, or rubs noted.  Palpable radial and pedal pulses bilaterally Lungs: Distant breath sounds bilaterally but clear. No wheezes, rhonchi, or rales noted.  Respiratory effort nonlabored Abd: Mildly distended but soft with tenderness along the epigastrium, LUQ and lower abdomen without peritonitis. Prior abdominal scars well healed as noted below.  MS: all 4 extremities are symmetrical with no cyanosis, clubbing, or edema. Skin: warm and dry with no masses, lesions, or rashes Psych: A&Ox3 with an appropriate affect.    Prior abdominal scars, well healed.    Results for orders placed or performed during the hospital encounter of 04/14/19 (from the past 48 hour(s))  CBC with Differential     Status: Abnormal   Collection Time: 04/14/19  8:46 AM  Result Value Ref Range   WBC 11.1 (H) 4.0 - 10.5 K/uL   RBC 3.52 (L) 3.87 - 5.11 MIL/uL   Hemoglobin 9.5 (L) 12.0 - 15.0 g/dL   HCT 04.5 (L) 40.9 - 81.1 %   MCV 82.7 80.0 - 100.0 fL   MCH 27.0 26.0 - 34.0 pg   MCHC 32.6 30.0 - 36.0 g/dL   RDW 91.4 78.2 - 95.6 %   Platelets 458 (H) 150 - 400 K/uL   nRBC 0.0 0.0 - 0.2 %   Neutrophils Relative % 86 %   Neutro Abs 9.5 (H) 1.7 - 7.7 K/uL   Lymphocytes Relative 8 %   Lymphs Abs 0.9 0.7 - 4.0 K/uL   Monocytes Relative 6 %   Monocytes Absolute 0.7  0.1 - 1.0 K/uL   Eosinophils Relative 0 %   Eosinophils Absolute 0.0 0.0 - 0.5 K/uL   Basophils Relative 0 %   Basophils Absolute 0.0 0.0 - 0.1 K/uL   nRBC 0 0 /100 WBC   Abs Immature Granulocytes 0.00 0.00 - 0.07 K/uL   Polychromasia PRESENT     Comment: Performed at [facility], [address], [place], [place] [address]  Comprehensive metabolic panel     Status: Abnormal   Collection Time: 04/14/19  8:46 AM  Result Value Ref Range   Sodium 128 (L) 135 - 145 mmol/L   Potassium 4.7 3.5 - 5.1 mmol/L   Chloride 88 (L) 98 - 111 mmol/L   CO2 22 22 - 32 mmol/L   Glucose, Bld 113 (H) 70 - 99 mg/dL   BUN 67 (H)  6 - 20 mg/dL   Creatinine, Ser 4.81 (H) 0.44 - 1.00 mg/dL   Calcium 8.9 8.9 - 85.6 mg/dL   Total Protein 6.9 6.5 - 8.1 g/dL   Albumin 3.2 (L) 3.5 - 5.0 g/dL   AST 30 15 - 41 U/L   ALT 20 0 - 44 U/L   Alkaline Phosphatase 46 38 - 126 U/L   Total Bilirubin 0.5 0.3 - 1.2 mg/dL   GFR calc non Af Amer 15 (L) >60 mL/min   GFR calc Af Amer 18 (L) >60 mL/min   Anion gap 18 (H) 5 - 15    Comment: Performed at [facility], [address] [place], [place] [address]  Lipase, blood     Status: None   Collection Time: 04/14/19  8:46 AM  Result Value Ref Range   Lipase 18 11 - 51 U/L    Comment: Performed at [facility], [address] [place], [place] [address]  Urinalysis, Routine w reflex microscopic     Status: Abnormal   Collection Time: 04/14/19  8:46 AM  Result Value Ref Range   Color, Urine YELLOW YELLOW   APPearance TURBID (A) CLEAR   Specific Gravity, Urine 1.020 1.005 - 1.030   pH 5.0 5.0 - 8.0   Glucose, UA NEGATIVE NEGATIVE mg/dL   Hgb urine dipstick MODERATE (A) NEGATIVE   Bilirubin Urine NEGATIVE NEGATIVE   Ketones, ur NEGATIVE NEGATIVE mg/dL   Protein, ur 858 (A) NEGATIVE mg/dL   Nitrite NEGATIVE NEGATIVE   Leukocytes,Ua MODERATE (A) NEGATIVE   RBC / HPF 11-20 0 - 5 RBC/hpf   WBC, UA >50 (H) 0 - 5 WBC/hpf   Bacteria, UA MANY (A) NONE SEEN    Squamous Epithelial / LPF 21-50 0 - 5   Mucus PRESENT    Granular Casts, UA PRESENT     Comment: Performed at [facility], [address] [place], [place] [address]  TSH     Status: Abnormal   Collection Time: 04/14/19  8:46 AM  Result Value Ref Range   TSH 4.970 (H) 0.350 - 4.500 uIU/mL    Comment: Performed by a 3rd Generation assay with a functional sensitivity of <=0.01 uIU/mL. Performed at [facility], [address] [place], [place] [address]   T4, free     Status: Abnormal   Collection Time: 04/14/19  8:46 AM  Result Value Ref Range   Free T4 1.32 (H) 0.61 - 1.12 ng/dL    Comment: (NOTE) Biotin ingestion may interfere with free T4 tests. If the results are inconsistent with the TSH level, previous test results, or the clinical presentation, then consider biotin interference. If needed, order repeat testing after stopping biotin. Performed at [facility], [address] [place], [place] [address]   POC occult blood, ED Provider will collect     Status: None   Collection Time: 04/14/19  9:19 AM  Result Value Ref Range   Fecal Occult Bld NEGATIVE NEGATIVE  POC urine preg, ED     Status: None   Collection Time: 04/14/19  9:38 AM  Result Value Ref Range   Preg Test, Ur NEGATIVE NEGATIVE    Comment:        THE SENSITIVITY OF THIS METHODOLOGY IS >24 mIU/mL   Lactic acid, plasma     Status: None   Collection Time: 04/14/19  9:44 AM  Result Value Ref Range   Lactic Acid, Venous 1.8 0.5 -  1.9 mmol/L    Comment: Performed at [facility], [address] [place], [place] [address]  APTT     Status: None   Collection Time: 04/14/19 10:55 AM  Result Value Ref Range   aPTT 34 24 - 36 seconds    Comment: Performed at [facility], [address] [place], [place] [address]  Protime-INR     Status: Abnormal   Collection Time: 04/14/19 10:55 AM  Result Value Ref Range   Prothrombin Time 15.9 (H) 11.4 - 15.2 seconds   INR 1.3 (H) 0.8 - 1.2     Comment: (NOTE) INR goal varies based on device and disease states. Performed at [facility], [address] [place], [place] [address]   I-Stat beta hCG blood, ED     Status: None   Collection Time: 04/14/19 11:22 AM  Result Value Ref Range   I-stat hCG, quantitative <5.0 <5 mIU/mL   Comment 3            Comment:   GEST. AGE      CONC.  (mIU/mL)   <=1 WEEK        5 - 50     2 WEEKS       50 - 500     3 WEEKS       100 - 10,000     4 WEEKS     1,000 - 30,000        FEMALE AND NON-PREGNANT FEMALE:     LESS THAN 5 mIU/mL    Ct Abdomen Pelvis Wo Contrast  Result Date: 04/14/2019 CLINICAL DATA:  28 year old female with abdominal pain. EXAM: CT ABDOMEN AND PELVIS WITHOUT CONTRAST TECHNIQUE: Multidetector CT imaging of the abdomen and pelvis was performed following the standard protocol without IV contrast. COMPARISON:  CT of the chest abdomen pelvis dated 04/20/2017 FINDINGS: Evaluation of this exam is limited in the absence of intravenous contrast. Lower chest: Partially visualized small left and probable trace right pleural effusion. The visualized lung bases are clear. There is hypoattenuation of the cardiac blood pool suggestive of a degree of anemia. Clinical correlation is recommended. Partially visualized anterior pericardial effusion measuring up to 7 mm in thickness. No intra-abdominal free air. Small to moderate ascites. Hepatobiliary: No focal liver abnormality is seen. No gallstones, gallbladder wall thickening, or biliary dilatation. Pancreas: Unremarkable. No pancreatic ductal dilatation or surrounding inflammatory changes. Spleen: Normal in size without focal abnormality. Adrenals/Urinary Tract: The adrenal glands are unremarkable. There is no hydronephrosis or nephrolithiasis on either side. The visualized ureters and urinary bladder appear unremarkable. Stomach/Bowel: Evaluation of the bowel is very limited in the absence of oral contrast and secondary to ascites. There is  postsurgical changes of bowel with anastomotic suture in the left upper abdomen. Diffusely dilated air-filled loops of small bowel throughout the abdomen measure up to 3.3 cm in diameter. A definite transition point is not identified. There is however apparent area of tethering of the adjacent small bowel (series 3, image 50). Findings therefore may represent enteritis with associated ileus versus small-bowel obstruction secondary to adhesions. Clinical correlation is recommended. CT with oral contrast or small-bowel series may provide better evaluation. The colon appears unremarkable. Appendectomy. Vascular/Lymphatic: The abdominal aorta and IVC are unremarkable. No portal venous gas. There is no adenopathy. Reproductive: The uterus is grossly unremarkable. Probable 3 cm left ovarian dominant follicle or corpus luteum. Other: Small fat containing supraumbilical hernia. Musculoskeletal: No acute or significant osseous findings. IMPRESSION:  1. Diffusely dilated air-filled loops of small bowel throughout the abdomen may represent enteritis with associated ileus versus small-bowel obstruction. CT with oral contrast or small-bowel series may provide better evaluation. 2. Small to moderate ascites. 3. No hydronephrosis or nephrolithiasis. Electronically Signed   By: Elgie Collard M.D.   On: 04/14/2019 11:10   Anti-infectives (From admission, onward)   Start     Dose/Rate Route Frequency Ordered Stop   04/14/19 1100  cefTRIAXone (ROCEPHIN) 1 g in sodium chloride 0.9 % 100 mL IVPB     1 g 200 mL/hr over 30 Minutes Intravenous Every 24 hours 04/14/19 1055       Assessment/Plan Abnormal Thyroid Function Tests - TSH 4.97 and T4 1.32 ? UTI - On Rocephin  ARF - Cr 3.83 Pericardial effusion Pleural effusions  - Above per medicine -  SIRS Abdominal Pain Intractable Nausea and Emesis Enteritis Ileus vs SBO on CT - Patient without acute abdomen. Lactic acid wnl. No indication for emergent or urgent surgery.  Suspect that symptoms are related to ileus 2/2 enteritis as there is no clear transition point on CT. Suspect ascites is 2/2 ARF. Recommend NGT, bowel rest and continued IVF. Will review imaging with MD.   Jacinto Halim, [facility] 04/14/2019, 12:35 PM Pager: [phone]

## 2019-04-14 NOTE — H&P (Addendum)
Date: 04/14/2019               Patient Name:  Kim Myers MRN: [id number]  DOB: December 10, 1990 Age / Sex: 28 y.o., female   PCP: Salvadore Dom, MD         Medical Service: Internal Medicine Teaching Service         Attending Physician: Dr. Velna Ochs, MD    First Contact: Dr. Marianna Payment Pager: [phone]  Second Contact: Dr. Sharon Seller Pager: [phone]       After Hours (After 5p/  First Contact Pager: [phone]  weekends / holidays): Second Contact Pager: [phone]   Chief Complaint: SBO   History of Present Illness: Kim Myers is a 28 year old femal with a past medical history of amenorrhea, unspecified eating disorder, who presented with 4-day history of abdominal pain that started on Sunday in the epigastric region.  Pain gradually progressed and is now in the lower quadrants bilaterally.  Patient has had associated nausea and vomiting with up to 20 episodes of emesis.  Some of the emesis was dark brown and others were minimal.  She states that she has had decreased p.o. intake during this time.  Her last oral intake was at 6:00 yesterday evening.  Patient states that she has not had a bowel movement in several days and her last bowel movement was this morning volume and hard, which she describes as pellets.  She denies blood in her bowel movement.  Patient states during this time.  She had a difficult time urinating, but denies dysuria or hematuria.  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  Patient denies fever, chills, chest pain, sick contacts, change in medications, history of constipation.   Social:  Patient denies alcohol use Patient denies tobacco use Patient denies nonprescription drug use  Family History:  Patient was not sure about family history of cancer, heart disease, stroke.  Meds:  No outpatient medications have been marked as taking for the 04/14/19 encounter [facility] Encounter).      Allergies: Allergies as of 04/14/2019 - Review Complete 04/14/2019  Allergen Reaction Noted  . Gluten meal Diarrhea, Nausea And Vomiting, and Other (See Comments) 04/20/2017   Past Medical History:  Diagnosis Date  . Amenorrhea   . Blood transfusion without reported diagnosis   . Gluten intolerance   . History of anemia    resolved with dietary changes  . Hypothyroidism (acquired) 03/25/2018     Review of Systems: A complete ROS was negative except as per HPI.   Physical Exam: Blood pressure 122/71, pulse (!) 123, temperature (!) 100.6 F (38.1 C), temperature source Rectal, resp. rate (!) 25, height 5\' 6"  (1.676 m), weight 59 kg, SpO2 98 %. Physical Exam  Constitutional: She is oriented to person, place, and time. She appears distressed.  HENT:  Head: Atraumatic.  Eyes: EOM are normal.  Neck: Normal range of motion.  Cardiovascular: Regular rhythm, normal heart sounds and intact distal pulses. Tachycardia present. Exam reveals no gallop and no friction rub.  No murmur heard. Pulmonary/Chest: Breath sounds normal. Tachypnea noted. She exhibits no tenderness.  Abdominal: Soft. She exhibits no distension (Diffuse tenderness) and no mass. There is abdominal tenderness. There is guarding.  Musculoskeletal: Normal range of motion.        General: No tenderness or edema.  Neurological: She is alert and oriented to person, place, and time.  Skin: Skin is dry. She is not  diaphoretic. There is pallor.    EKG: personally reviewed my interpretation is Sinus tachycardia, Right axis deviation, Borderline T wave abnormalities, Borderline ST elevation, lateral leads, Rate faster, st change likely rate related, Otherwise no significant change ...  CT Abdomen:  1. Diffusely dilated air-filled loops of small bowel throughout the abdomen may represent enteritis with associated ileus versus small-bowel obstruction. CT with oral contrast or small-bowel series may provide better  evaluation. 2. Small to moderate ascites. 3. No hydronephrosis or nephrolithiasis  CBC    Component Value Date/Time   WBC 11.1 (H) 04/14/2019 0846   RBC 3.52 (L) 04/14/2019 0846   HGB 9.5 (L) 04/14/2019 0846   HGB 11.0 (L) 12/22/2017 1449   HCT 29.1 (L) 04/14/2019 0846   HCT 31.8 (L) 12/22/2017 1449   PLT 458 (H) 04/14/2019 0846   PLT 318 12/22/2017 1449   MCV 82.7 04/14/2019 0846   MCV 89 12/22/2017 1449   MCH 27.0 04/14/2019 0846   MCHC 32.6 04/14/2019 0846   RDW 13.0 04/14/2019 0846   RDW 13.6 12/22/2017 1449   LYMPHSABS 0.9 04/14/2019 0846   MONOABS 0.7 04/14/2019 0846   EOSABS 0.0 04/14/2019 0846   BASOSABS 0.0 04/14/2019 0846   CMP     Component Value Date/Time   NA 128 (L) 04/14/2019 0846   NA 127 (L) 12/22/2017 1449   K 4.7 04/14/2019 0846   CL 88 (L) 04/14/2019 0846   CO2 22 04/14/2019 0846   GLUCOSE 113 (H) 04/14/2019 0846   BUN 67 (H) 04/14/2019 0846   BUN 9 12/22/2017 1449   CREATININE 3.83 (H) 04/14/2019 0846   CALCIUM 8.9 04/14/2019 0846   PROT 6.9 04/14/2019 0846   PROT 6.8 12/22/2017 1449   ALBUMIN 3.2 (L) 04/14/2019 0846   ALBUMIN 5.0 12/22/2017 1449   AST 30 04/14/2019 0846   ALT 20 04/14/2019 0846   ALKPHOS 46 04/14/2019 0846   BILITOT 0.5 04/14/2019 0846   BILITOT 0.2 12/22/2017 1449   GFRNONAA 15 (L) 04/14/2019 0846   GFRAA 18 (L) 04/14/2019 0846    Assessment & Plan by Problem: Active Problems:   Acute renal injury (HCC)  #Small Bowel Obstruction  Patient presents with signs and symptoms concerning for small bowel obstruction.  CT scan shows diffusely dilated air loops of small bowel throughout the abdomen with no discernible cutoff point.  Surgery was consulted and NG tube was placed. - Appreciated Surgery recommendations.   #AKI Patient had an acute increase in creatinine to 3.83 with a baseline of 0.9. Is likely secondary to prerenal azotemia in the setting of decreased p.o. intake. UA shows proteinuria, nitrite, hemoglobin with  many bacteria, moderate leukocytes,  hyaline casts, and mucus.  Differential diagnosis include UTI with patient presenting with fever of 100.6, tachycardia, with bacteriuria.  Patient does deny dysuria. - Patient given 3 L NS in the ED we will recheck BMP in the morning.  - Renal US pending  #Leukocytosis  - WBC count of 11,000. - Lactic acid of 1.8 - Recheck CBC in the morning  #Hyponatremia: -Likely secondary to hypobulimic hyponatremia  #Elevated TSH: #Elevated T4: Patient presents with an elevated TSH of 4.97 and a free T4 of 1.32 of unknown clinical significance.  Patient does have a history of amenorrhea likely secondary to unspecified eating disorder. Previously documented hypothyroidism. - Repeat TSH and T4 in the morning.  - Consider further endocrine with prolactin, FSH, LH. - Consider Pituitary MRI.  Diet: NPO VTE: Enoxaparin IVF: NS Code: Full  code  Dispo: Admit patient to Inpatient with expected length of stay greater than 2 midnights.  Signed: Dellia Cloud, MD 04/14/2019, 3:05 PM  Pager: [phone]

## 2019-04-14 NOTE — ED Notes (Signed)
Only able to collect one full culture

## 2019-04-15 ENCOUNTER — Encounter (HOSPITAL_COMMUNITY): Payer: Self-pay | Admitting: Internal Medicine

## 2019-04-15 DIAGNOSIS — K56609 Unspecified intestinal obstruction, unspecified as to partial versus complete obstruction: Secondary | ICD-10-CM

## 2019-04-15 DIAGNOSIS — Z9049 Acquired absence of other specified parts of digestive tract: Secondary | ICD-10-CM

## 2019-04-15 DIAGNOSIS — D649 Anemia, unspecified: Secondary | ICD-10-CM

## 2019-04-15 DIAGNOSIS — K529 Noninfective gastroenteritis and colitis, unspecified: Secondary | ICD-10-CM

## 2019-04-15 LAB — COMPREHENSIVE METABOLIC PANEL
ALT: 17 U/L (ref 0–44)
ALT: 20 U/L (ref 0–44)
AST: 29 U/L (ref 15–41)
AST: 33 U/L (ref 15–41)
Albumin: 2.3 g/dL — ABNORMAL LOW (ref 3.5–5.0)
Albumin: 2.3 g/dL — ABNORMAL LOW (ref 3.5–5.0)
Alkaline Phosphatase: 37 U/L — ABNORMAL LOW (ref 38–126)
Alkaline Phosphatase: 38 U/L (ref 38–126)
Anion gap: 10 (ref 5–15)
Anion gap: 12 (ref 5–15)
BUN: 55 mg/dL — ABNORMAL HIGH (ref 6–20)
BUN: 51 mg/dL — ABNORMAL HIGH (ref 6–20)
CO2: 24 mmol/L (ref 22–32)
CO2: 22 mmol/L (ref 22–32)
Calcium: 7.8 mg/dL — ABNORMAL LOW (ref 8.9–10.3)
Calcium: 8 mg/dL — ABNORMAL LOW (ref 8.9–10.3)
Chloride: 101 mmol/L (ref 98–111)
Chloride: 102 mmol/L (ref 98–111)
Creatinine, Ser: 3.31 mg/dL — ABNORMAL HIGH (ref 0.44–1.00)
Creatinine, Ser: 2.94 mg/dL — ABNORMAL HIGH (ref 0.44–1.00)
GFR calc Af Amer: 21 mL/min — ABNORMAL LOW (ref >60)
GFR calc Af Amer: 24 mL/min — ABNORMAL LOW (ref >60)
GFR calc non Af Amer: 18 mL/min — ABNORMAL LOW (ref >60)
GFR calc non Af Amer: 21 mL/min — ABNORMAL LOW (ref >60)
Glucose, Bld: 99 mg/dL (ref 70–99)
Glucose, Bld: 98 mg/dL (ref 70–99)
Potassium: 4.7 mmol/L (ref 3.5–5.1)
Potassium: 4.2 mmol/L (ref 3.5–5.1)
Sodium: 135 mmol/L (ref 135–145)
Sodium: 136 mmol/L (ref 135–145)
Total Bilirubin: 0.6 mg/dL (ref 0.3–1.2)
Total Bilirubin: 0.9 mg/dL (ref 0.3–1.2)
Total Protein: 5.4 g/dL — ABNORMAL LOW (ref 6.5–8.1)
Total Protein: 5.4 g/dL — ABNORMAL LOW (ref 6.5–8.1)

## 2019-04-15 LAB — CBC
HCT: 20.5 % — ABNORMAL LOW (ref 36.0–46.0)
HCT: 23.8 % — ABNORMAL LOW (ref 36.0–46.0)
Hemoglobin: 6.8 g/dL — CL (ref 12.0–15.0)
Hemoglobin: 8 g/dL — ABNORMAL LOW (ref 12.0–15.0)
MCH: 27.3 pg (ref 26.0–34.0)
MCH: 27.7 pg (ref 26.0–34.0)
MCHC: 33.2 g/dL (ref 30.0–36.0)
MCHC: 33.6 g/dL (ref 30.0–36.0)
MCV: 82.3 fL (ref 80.0–100.0)
MCV: 82.4 fL (ref 80.0–100.0)
Platelets: 331 10*3/uL (ref 150–400)
Platelets: 356 10*3/uL (ref 150–400)
RBC: 2.49 MIL/uL — ABNORMAL LOW (ref 3.87–5.11)
RBC: 2.89 MIL/uL — ABNORMAL LOW (ref 3.87–5.11)
RDW: 13 % (ref 11.5–15.5)
RDW: 13.6 % (ref 11.5–15.5)
WBC: 6.6 10*3/uL (ref 4.0–10.5)
WBC: 8.7 10*3/uL (ref 4.0–10.5)
nRBC: 0 % (ref 0.0–0.2)
nRBC: 0 % (ref 0.0–0.2)

## 2019-04-15 LAB — ABO/RH: ABO/RH(D): A NEG

## 2019-04-15 LAB — TSH: TSH: 1.861 u[IU]/mL (ref 0.350–4.500)

## 2019-04-15 LAB — HEMOGLOBIN AND HEMATOCRIT, BLOOD
HCT: 25 % — ABNORMAL LOW (ref 36.0–46.0)
Hemoglobin: 8.2 g/dL — ABNORMAL LOW (ref 12.0–15.0)

## 2019-04-15 LAB — SAVE SMEAR(SSMR), FOR PROVIDER SLIDE REVIEW

## 2019-04-15 LAB — OCCULT BLOOD X 1 CARD TO LAB, STOOL: Fecal Occult Bld: POSITIVE — AB

## 2019-04-15 LAB — T4, FREE: Free T4: 1.18 ng/dL — ABNORMAL HIGH (ref 0.61–1.12)

## 2019-04-15 LAB — LACTIC ACID, PLASMA
Lactic Acid, Venous: 0.9 mmol/L (ref 0.5–1.9)
Lactic Acid, Venous: 0.8 mmol/L (ref 0.5–1.9)

## 2019-04-15 LAB — HIV ANTIBODY (ROUTINE TESTING W REFLEX): HIV Screen 4th Generation wRfx: NONREACTIVE

## 2019-04-15 LAB — CK: Total CK: 790 U/L — ABNORMAL HIGH (ref 38–234)

## 2019-04-15 LAB — URINE CULTURE: Culture: <10000 — AB

## 2019-04-15 LAB — PREPARE RBC (CROSSMATCH)

## 2019-04-15 LAB — MAGNESIUM: Magnesium: 2.6 mg/dL — ABNORMAL HIGH (ref 1.7–2.4)

## 2019-04-15 LAB — LACTATE DEHYDROGENASE: LDH: 317 U/L — ABNORMAL HIGH (ref 98–192)

## 2019-04-15 MED ORDER — LEVOFLOXACIN IN D5W 750 MG/150ML IV SOLN
750.0000 mg | INTRAVENOUS | Status: DC
  Filled 2019-04-15: qty 150

## 2019-04-15 MED ORDER — PANTOPRAZOLE SODIUM 40 MG IV SOLR
40.0000 mg | Freq: Two times a day (BID) | INTRAVENOUS | Status: DC
  Administered 2019-04-15 – 2019-04-29 (×28): 40 mg via INTRAVENOUS
  Filled 2019-04-15 (×29): qty 40

## 2019-04-15 MED ORDER — MENTHOL 3 MG MT LOZG
1.0000 | LOZENGE | OROMUCOSAL | Status: DC | PRN
  Administered 2019-04-15: 3 mg via ORAL
  Filled 2019-04-15: qty 9

## 2019-04-15 MED ORDER — HYDROMORPHONE HCL 1 MG/ML IJ SOLN
0.2500 mg | Freq: Once | INTRAMUSCULAR | Status: AC
  Administered 2019-04-15: 0.25 mg via INTRAVENOUS
  Filled 2019-04-15: qty 0.5

## 2019-04-15 MED ORDER — SODIUM CHLORIDE 0.9% IV SOLUTION
Freq: Once | INTRAVENOUS | Status: AC
  Administered 2019-04-15: 08:00:00 via INTRAVENOUS

## 2019-04-15 MED ORDER — RAMELTEON 8 MG PO TABS
8.0000 mg | ORAL_TABLET | Freq: Once | ORAL | Status: DC
  Filled 2019-04-15: qty 1

## 2019-04-15 MED ORDER — PANTOPRAZOLE SODIUM 40 MG IV SOLR
40.0000 mg | Freq: Once | INTRAVENOUS | Status: AC
  Administered 2019-04-15: 40 mg via INTRAVENOUS
  Filled 2019-04-15: qty 40

## 2019-04-15 MED ORDER — LEVOFLOXACIN IN D5W 750 MG/150ML IV SOLN
750.0000 mg | INTRAVENOUS | Status: DC
  Administered 2019-04-15 – 2019-04-17 (×2): 750 mg via INTRAVENOUS
  Filled 2019-04-15 (×3): qty 150

## 2019-04-15 MED ORDER — INFLUENZA VAC SPLIT QUAD 0.5 ML IM SUSY
0.5000 mL | PREFILLED_SYRINGE | INTRAMUSCULAR | Status: AC
  Administered 2019-04-16: 0.5 mL via INTRAMUSCULAR
  Filled 2019-04-15: qty 0.5

## 2019-04-15 MED ORDER — PANTOPRAZOLE SODIUM 40 MG IV SOLR: 40.0000 mg | Freq: Once | INTRAVENOUS | Status: DC

## 2019-04-15 NOTE — Progress Notes (Addendum)
Subjective: Patient was seen this morning on rounds.  Patient states that her abdominal pain has improved since was being admitted.  She states that she was able to urinate a little bit but now has pain with urination.  Otherwise the patient denies fevers, chills, chest pain, dizziness.  Patient still has not had a bowel movement.   Objective:  Vital signs in last 24 hours: Vitals:   04/14/19 2245 04/15/19 0017 04/15/19 0223 04/15/19 0415  BP: 121/66 126/63 126/71 119/68  Pulse: (!) 121 (!) 119 (!) 122 (!) 108  Resp: (!) 25 16 18 19   Temp:  98.9 F (37.2 C) (!) 100.7 F (38.2 C) 99.1 F (37.3 C)  TempSrc:  Oral Oral Oral  SpO2: 97% 98% 97% 97%  Weight:  61.2 kg    Height:  5\' 6"  (1.676 m)      Physical Exam: Physical Exam  Constitutional: No distress.  Eyes: EOM are normal.  Neck: Normal range of motion.  Cardiovascular: Intact distal pulses. Tachycardia present.  Pulmonary/Chest: Breath sounds normal. Tachypnea noted. She exhibits no tenderness.  Abdominal: Bowel sounds are normal. She exhibits no mass. There is abdominal tenderness.  Musculoskeletal: Normal range of motion.        General: No tenderness or edema.  Neurological: She is alert.  Skin: Skin is warm and dry. She is not diaphoretic.    Pertinent labs/Imaging: CBC Latest Ref Rng & Units 04/15/2019 04/14/2019 12/22/2017  WBC 4.0 - 10.5 K/uL 6.6 11.1(H) 6.1  Hemoglobin 12.0 - 15.0 g/dL 6.8(LL) 9.5(L) 11.0(L)  Hematocrit 36.0 - 46.0 % 20.5(L) 29.1(L) 31.8(L)  Platelets 150 - 400 K/uL 331 458(H) 318   CMP Latest Ref Rng & Units 04/15/2019 04/15/2019 04/14/2019  Glucose 70 - 99 mg/dL 98 99 04/17/2019)  BUN 6 - 20 mg/dL 04/16/2019) 025(E) 52(D)  Creatinine 0.44 - 1.00 mg/dL 78(E) 42(P) 5.36(R)  Sodium 135 - 145 mmol/L 136 135 128(L)  Potassium 3.5 - 5.1 mmol/L 4.2 4.7 4.7  Chloride 98 - 111 mmol/L 102 101 88(L)  CO2 22 - 32 mmol/L 22 24 22   Calcium 8.9 - 10.3 mg/dL 8.0(L) 7.8(L) 8.9  Total Protein 6.5 - 8.1 g/dL  4.43(X) 5.40(G) 6.9  Total Bilirubin 0.3 - 1.2 mg/dL 0.9 0.6 0.5  Alkaline Phos 38 - 126 U/L 38 37(L) 46  AST 15 - 41 U/L 33 29 30  ALT 0 - 44 U/L 20 17 20    TSH - 1.86 T4 - 1.18 (H) Mg - 2.6 (L) LA - 0.9 LDH - 317 (H) CK - 790 (H) FOBT - Positive Protein/CR ratio - pending Blood smear - pending  Renal :  1. Negative for obstructive uropathy. 2. Slightly increased renal cortical echogenicity of the right kidney, which could reflect medical renal disease. 3. Left kidney is asymmetrically large relative to the right suggesting compensatory hypertrophy. 4. Small to moderate volume ascites.  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  Her clinical course was complicated by bladder obstruction and UTI, AKI that is poorly responsive to fluids, and upper GI bleed.    #SBO CT abdomen  shows signs and symptoms concerning for small bowel obstruction versus enteritis versus ileus.  NG tube has been placed with significant output. Filled entire suction canister (approximately 500 cc or more).  NG tube output is a dark brown color concerning for upper GI bleed.  FOBT from suctions canister was positive.  Patient's abdominal pain is improving in the setting of LDH was elevated, but normal lactic acid  which is less concerning for necrotic bowel.  Patient does have a history of previous GI bleed which she states is due to a gluten allergy.  She states that she was negative for celiac testing at that time. -Continue NG tube -Start levofloxacin 750 mg IV for possible  enteritis -Replete electrolytes - Appreciate Surgery recommendations  - Start PPI for upper GI bleed  #AKI #UTI Patient's creatinine functions has minimally improved in the last 24 hours with IV fluids.  Her creatinine is still elevated at 2.94 with a GFR of 21.  Renal ultrasound shows atrophic right kidney with compensatory left kidney hypertrophy concerning for medical kidney disease.  Patient's urinalysis concerning for urinary tract infection and currently being treated with levofloxacin. -Continue levofloxacin for UTI -Get protein/creatinine ratio for intrarenal etiology - Nephrology consult   #Leukocytosis: Resolved  #Anemia: Patient developed a significant anemia with a hemoglobin of 6.8 overnight and transfused 1 unit of blood.  Acute anemia likely due to upper GI bleed.  Patient's LDH was elevated concerning for hemolytic process such as HUS in the setting of acute kidney failure.  Patient does not have a thrombocytopenia.  We will get a blood smear at this time. This is lower on our differential.   #Amenorrhea: Patient has been worked up extensively in the outpatient setting for amenorrhea.  Endocrinology believes her amenorrhea is likely secondary to her caloric deficit in the setting of eating disorder and extensive exercise.  Patient was seeing a counselor and her weight has improved.  BMI currently 21.  previously concerns for hypothyroidism.  Patient is was worked up for Cushing's with elevated cortisol.  Patient's dexamethasone suppression test did not suppress cortisol.  Patient had a 24-hour collection of cortisol that was elevated.  Outpatient endocrinology wanted to repeat this at follow-up appointment. - Possibly repeat 24 hour cortisol.  Diet: NPO IVF: NS VTE: Heparin  Code:   Dispo: Anticipated discharge pending clinical improvement.  Marianna Payment, MD 04/15/2019, 5:08 AM Pager: [phone]

## 2019-04-15 NOTE — Progress Notes (Signed)
Patient being transferred to [facility] [facility] report called to [facility].

## 2019-04-15 NOTE — Plan of Care (Signed)
  Problem: Education: Goal: Knowledge of General Education information will improve Description Including pain rating scale, medication(s)/side effects and non-pharmacologic comfort measures Outcome: Progressing   

## 2019-04-15 NOTE — Progress Notes (Signed)
PHARMACY NOTE:  ANTIMICROBIAL RENAL DOSAGE ADJUSTMENT  Current antimicrobial regimen includes a mismatch between antimicrobial dosage and estimated renal function.  As per policy approved by the Pharmacy & Therapeutics and Medical Executive Committees, the antimicrobial dosage will be adjusted accordingly.  Current antimicrobial dosage:  Levaquin 750 mg IV q24h  Renal Function:  Estimated Creatinine Clearance: 26.7 mL/min (A) (by C-G formula based on SCr of 2.94 mg/dL (H)).   []      On intermittent HD, scheduled: []      On CRRT    Antimicrobial dosage has been changed to:  Levaquin 750 mg IV q48h   Harvel Quale, [facility] 04/15/2019 11:34 AM

## 2019-04-15 NOTE — Progress Notes (Signed)
Patient transfused with 1 unit PRBC tolerated well with no signs or symptoms of adverse reaction. Patient resting in bed at present with call bell in reach. Will continue to monitor closely for remainder of shift.

## 2019-04-15 NOTE — Consult Note (Addendum)
[facility] Renal Consultation Note  Requesting MD: IMTS Indication for Consultation: AKI   HPI:  Kim Myers is a 28 y.o. 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 She presented on 10/21 with 4 day history of epigastric pain, nausea and multiple episodes of emesis. Also endorsed constipation, difficulty emptying her bladder and dysuria.   On arrival to the ED, she was febrile to 100.6, tachycardic, and normotensive. Work-up significant for mild leukocytosis of 11, Hgb 9.5, sodium of 128, crt 3.8 (previously normal at 0.7 in 12/2017). U/A showed hematuria, proteinuria, moderate leuks, pyuria and many bacteria. CT abd/pelvis showed findings concerning for enteritis with ileus vs SBO. General surgery recommended conservative management with NG tube, fluids and pain control. She was admitted to IMTS.  Regarding AKI, renal u/s was negative for obstructive uropathy, showed slightly increased cortical echogenicity on right with asymmetrically large left kidney suggesting compensatory hypertrophy. She was resuscitated with 5L of fluid. Crt trended from 3.8>2.9.  She subsequently had an acute downtrend in Hgb from 9.5>6.8 overnight, and she was transfused 1 unit PRBCs. Noted to have >1L of coffee ground fluid from NGT indicative of upper GI bleed as etiology.   Upon evaluation this afternoon, patient feels significantly improved compared to yesterday in regards to her abdominal pain and looks forward to being able to advance to liquid diet. She denies frequent NSAID use or OTC supplements. Is not on any prescription medications. Denies shortness of breath or swelling in lower extremities. No hematuria or changes in urination leading prior to admission.  Creatinine 0.7 in July 2019.  Definitely clinically volume depleted on admission with creatinine 3.8, it has  improved with volume repletion  Creatinine, Ser  Date/Time Value Ref Range Status  04/15/2019 08:22 AM 2.94 (H) 0.44 - 1.00 mg/dL Final  16/03/9603 54:09 AM 3.31 (H) 0.44 - 1.00 mg/dL Final  81/19/1478 29:56 AM 3.83 (H) 0.44 - 1.00 mg/dL Final  21/30/8657 84:69 PM 0.71 0.57 - 1.00 mg/dL Final  62/95/2841 32:44 PM 0.90 0.44 - 1.00 mg/dL Final  06/26/7251 66:44 PM 0.97 0.44 - 1.00 mg/dL Final  03/47/4259 56:38 AM 0.56 0.4 - 1.2 mg/dL Final  75/64/3329 51:88 PM 0.67 0.4 - 1.2 mg/dL Final     PMHx:   Past Medical History:  Diagnosis Date  . Amenorrhea   . Anemia   . Blood transfusion without reported diagnosis   . Gluten intolerance   . History of anemia    resolved with dietary changes  . Hypothyroidism (acquired) 03/25/2018    Past Surgical History:  Procedure Laterality Date  . MECKEL DIVERTICULUM EXCISION  at 19   started as laparoscopy, ended in laparotomy    Family Hx:  Family History  Problem Relation Age of Onset  . Breast cancer Paternal Grandmother   . Ovarian cancer Maternal Grandmother     Social History:  reports that she has never smoked. She has never used smokeless tobacco. She reports that she does not drink alcohol or use drugs.  Allergies:  Allergies  Allergen Reactions  . Gluten Meal Diarrhea, Nausea And Vomiting and Other (See Comments)    Caused internal bleeding that resulted in hospitalization!!    Medications: Prior to Admission medications   Not on File    I have reviewed the patient's current medications.  Labs:  Results for orders placed or performed during the hospital encounter of  04/14/19 (from the past 48 hour(s))  CBC with Differential     Status: Abnormal   Collection Time: 04/14/19  8:46 AM  Result Value Ref Range   WBC 11.1 (H) 4.0 - 10.5 K/uL   RBC 3.52 (L) 3.87 - 5.11 MIL/uL   Hemoglobin 9.5 (L) 12.0 - 15.0 g/dL   HCT 29.529.1 (L) 28.436.0 - 13.246.0 %   MCV 82.7 80.0 - 100.0 fL   MCH 27.0 26.0 - 34.0 pg   MCHC 32.6 30.0 - 36.0  g/dL   RDW 44.013.0 10.211.5 - 72.515.5 %   Platelets 458 (H) 150 - 400 K/uL   nRBC 0.0 0.0 - 0.2 %   Neutrophils Relative % 86 %   Neutro Abs 9.5 (H) 1.7 - 7.7 K/uL   Lymphocytes Relative 8 %   Lymphs Abs 0.9 0.7 - 4.0 K/uL   Monocytes Relative 6 %   Monocytes Absolute 0.7 0.1 - 1.0 K/uL   Eosinophils Relative 0 %   Eosinophils Absolute 0.0 0.0 - 0.5 K/uL   Basophils Relative 0 %   Basophils Absolute 0.0 0.0 - 0.1 K/uL   nRBC 0 0 /100 WBC   Abs Immature Granulocytes 0.00 0.00 - 0.07 K/uL   Polychromasia PRESENT     Comment: Performed at [facility], [address] [place], [place] [address]  Comprehensive metabolic panel     Status: Abnormal   Collection Time: 04/14/19  8:46 AM  Result Value Ref Range   Sodium 128 (L) 135 - 145 mmol/L   Potassium 4.7 3.5 - 5.1 mmol/L   Chloride 88 (L) 98 - 111 mmol/L   CO2 22 22 - 32 mmol/L   Glucose, Bld 113 (H) 70 - 99 mg/dL   BUN 67 (H) 6 - 20 mg/dL   Creatinine, Ser 0.343.83 (H) 0.44 - 1.00 mg/dL   Calcium 8.9 8.9 - 74.210.3 mg/dL   Total Protein 6.9 6.5 - 8.1 g/dL   Albumin 3.2 (L) 3.5 - 5.0 g/dL   AST 30 15 - 41 U/L   ALT 20 0 - 44 U/L   Alkaline Phosphatase 46 38 - 126 U/L   Total Bilirubin 0.5 0.3 - 1.2 mg/dL   GFR calc non Af Amer 15 (L) >60 mL/min   GFR calc Af Amer 18 (L) >60 mL/min   Anion gap 18 (H) 5 - 15    Comment: Performed at [facility], [address] [place], [place] [id number]  Lipase, blood     Status: None   Collection Time: 04/14/19  8:46 AM  Result Value Ref Range   Lipase 18 11 - 51 U/L    Comment: Performed at [facility], [address] [place], [place] [address]  Urinalysis, Routine w reflex microscopic     Status: Abnormal   Collection Time: 04/14/19  8:46 AM  Result Value Ref Range   Color, Urine YELLOW YELLOW   APPearance TURBID (A) CLEAR   Specific Gravity, Urine 1.020 1.005 - 1.030   pH 5.0 5.0 - 8.0   Glucose, UA NEGATIVE NEGATIVE mg/dL   Hgb urine dipstick MODERATE (A) NEGATIVE   Bilirubin  Urine NEGATIVE NEGATIVE   Ketones, ur NEGATIVE NEGATIVE mg/dL   Protein, ur 332100 (A) NEGATIVE mg/dL   Nitrite NEGATIVE NEGATIVE   Leukocytes,Ua MODERATE (A) NEGATIVE   RBC / HPF 11-20 0 - 5 RBC/hpf   WBC, UA >50 (H) 0 - 5 WBC/hpf   Bacteria, UA MANY (A) NONE SEEN  Squamous Epithelial / LPF 21-50 0 - 5   Mucus PRESENT    Granular Casts, UA PRESENT     Comment: Performed at [facility], [address] [place], [place] [address]  TSH     Status: Abnormal   Collection Time: 04/14/19  8:46 AM  Result Value Ref Range   TSH 4.970 (H) 0.350 - 4.500 uIU/mL    Comment: Performed by a 3rd Generation assay with a functional sensitivity of <=0.01 uIU/mL. Performed at [facility], [address], [place], [place] [address]   T4, free     Status: Abnormal   Collection Time: 04/14/19  8:46 AM  Result Value Ref Range   Free T4 1.32 (H) 0.61 - 1.12 ng/dL    Comment: (NOTE) Biotin ingestion may interfere with free T4 tests. If the results are inconsistent with the TSH level, previous test results, or the clinical presentation, then consider biotin interference. If needed, order repeat testing after stopping biotin. Performed at [facility], [address], [place], [place] [address]   Urine culture     Status: None (Preliminary result)   Collection Time: 04/14/19  8:50 AM   Specimen: In/Out Cath Urine  Result Value Ref Range   Specimen Description IN/OUT CATH URINE    Special Requests NONE    Culture      CULTURE REINCUBATED FOR BETTER GROWTH Performed at [facility], [address] [place], [place] [address]    Report Status PENDING   POC occult blood, ED Provider will collect     Status: None   Collection Time: 04/14/19  9:19 AM  Result Value Ref Range   Fecal Occult Bld NEGATIVE NEGATIVE  POC urine preg, ED     Status: None   Collection Time: 04/14/19  9:38 AM  Result Value Ref Range   Preg Test, Ur NEGATIVE NEGATIVE    Comment:        THE SENSITIVITY  OF THIS METHODOLOGY IS >24 mIU/mL   Lactic acid, plasma     Status: None   Collection Time: 04/14/19  9:44 AM  Result Value Ref Range   Lactic Acid, Venous 1.8 0.5 - 1.9 mmol/L    Comment: Performed at [facility], [address], [place], [place] [address]  APTT     Status: None   Collection Time: 04/14/19 10:55 AM  Result Value Ref Range   aPTT 34 24 - 36 seconds    Comment: Performed at [facility], [address] [place], [place] [address]  Protime-INR     Status: Abnormal   Collection Time: 04/14/19 10:55 AM  Result Value Ref Range   Prothrombin Time 15.9 (H) 11.4 - 15.2 seconds   INR 1.3 (H) 0.8 - 1.2    Comment: (NOTE) INR goal varies based on device and disease states. Performed at [facility], [address] [place], [place] [address]   Blood Culture (routine x 2)     Status: None (Preliminary result)   Collection Time: 04/14/19 11:18 AM   Specimen: BLOOD LEFT ARM  Result Value Ref Range   Specimen Description BLOOD LEFT ARM    Special Requests      BOTTLES DRAWN AEROBIC ONLY Blood Culture results may not be optimal due to an inadequate volume of blood received in culture bottles   Culture      NO GROWTH < 24 HOURS Performed at [facility], [address] [place], [place] [address]    Report Status PENDING   I-Stat beta hCG blood, ED     Status: None   Collection Time: 04/14/19 11:22 AM  Result Value Ref Range   I-stat hCG, quantitative <5.0 <5 mIU/mL   Comment 3            Comment:   GEST. AGE      CONC.  (mIU/mL)   <=1 WEEK        5 - 50     2 WEEKS       50 - 500     3 WEEKS       100 - 10,000     4 WEEKS     1,000 - 30,000        FEMALE AND NON-PREGNANT FEMALE:     LESS THAN 5 mIU/mL   Urinalysis, Routine w reflex microscopic     Status: Abnormal   Collection Time: 04/14/19 11:59 AM  Result Value Ref Range   Color, Urine YELLOW YELLOW   APPearance CLOUDY (A) CLEAR   Specific Gravity, Urine 1.019 1.005 - 1.030   pH 5.0  5.0 - 8.0   Glucose, UA NEGATIVE NEGATIVE mg/dL   Hgb urine dipstick MODERATE (A) NEGATIVE   Bilirubin Urine NEGATIVE NEGATIVE   Ketones, ur NEGATIVE NEGATIVE mg/dL   Protein, ur 30 (A) NEGATIVE mg/dL   Nitrite NEGATIVE NEGATIVE   Leukocytes,Ua NEGATIVE NEGATIVE   RBC / HPF 0-5 0 - 5 RBC/hpf   WBC, UA 0-5 0 - 5 WBC/hpf   Bacteria, UA RARE (A) NONE SEEN   Squamous Epithelial / LPF 0-5 0 - 5   Mucus PRESENT    Hyaline Casts, UA PRESENT     Comment: Performed at [facility], [address], [place], [place] [address]  SARS CORONAVIRUS 2 (TAT 6-24 HRS) Nasopharyngeal Nasopharyngeal Swab     Status: None   Collection Time: 04/14/19 12:28 PM   Specimen: Nasopharyngeal Swab  Result Value Ref Range   SARS Coronavirus 2 NEGATIVE NEGATIVE    Comment: (NOTE) SARS-CoV-2 target nucleic acids are NOT DETECTED. The SARS-CoV-2 RNA is generally detectable in upper and lower respiratory specimens during the acute phase of infection. Negative results do not preclude SARS-CoV-2 infection, do not rule out co-infections with other pathogens, and should not be used as the sole basis for treatment or other patient management decisions. Negative results must be combined with clinical observations, patient history, and epidemiological information. The expected result is Negative. Fact Sheet for Patients: SugarRoll.be Fact Sheet for Healthcare Providers: https://www.woods-mathews.com/ This test is not yet approved or cleared by the [place] FDA and  has been authorized for detection and/or diagnosis of SARS-CoV-2 by FDA under an Emergency Use Authorization (EUA). This EUA will remain  in effect (meaning this test can be used) for the duration of the COVID-19 declaration under Section 56 4(b)(1) of the Act, 21 U.S.C. section 360bbb-3(b)(1), unless the authorization is terminated or revoked sooner. Performed at [facility], [place] [address]  [place], [place] [address]   Blood Culture (routine x 2)     Status: None (Preliminary result)   Collection Time: 04/14/19  1:05 PM   Specimen: BLOOD  Result Value Ref Range   Specimen Description BLOOD RIGHT ANTECUBITAL    Special Requests      BOTTLES DRAWN AEROBIC AND ANAEROBIC Blood Culture adequate volume   Culture      NO GROWTH < 24  HOURS Performed at [facility], [address], [place], [place] [address]    Report Status PENDING   Phosphorus     Status: Abnormal   Collection Time: 04/14/19  5:00 PM  Result Value Ref Range   Phosphorus 4.7 (H) 2.5 - 4.6 mg/dL    Comment: Performed at [facility], [address] [place], [place] [address]  Magnesium     Status: None   Collection Time: 04/14/19  5:00 PM  Result Value Ref Range   Magnesium 2.3 1.7 - 2.4 mg/dL    Comment: Performed at [facility], [address], [place], [place] [address]  Comprehensive metabolic panel     Status: Abnormal   Collection Time: 04/15/19  2:55 AM  Result Value Ref Range   Sodium 135 135 - 145 mmol/L   Potassium 4.7 3.5 - 5.1 mmol/L   Chloride 101 98 - 111 mmol/L   CO2 24 22 - 32 mmol/L   Glucose, Bld 99 70 - 99 mg/dL   BUN 55 (H) 6 - 20 mg/dL   Creatinine, Ser 9.14 (H) 0.44 - 1.00 mg/dL   Calcium 7.8 (L) 8.9 - 10.3 mg/dL   Total Protein 5.4 (L) 6.5 - 8.1 g/dL   Albumin 2.3 (L) 3.5 - 5.0 g/dL   AST 29 15 - 41 U/L   ALT 17 0 - 44 U/L   Alkaline Phosphatase 37 (L) 38 - 126 U/L   Total Bilirubin 0.6 0.3 - 1.2 mg/dL   GFR calc non Af Amer 18 (L) >60 mL/min   GFR calc Af Amer 21 (L) >60 mL/min   Anion gap 10 5 - 15    Comment: Performed at [facility], [address], [place], [place] [address]  CBC     Status: Abnormal   Collection Time: 04/15/19  2:55 AM  Result Value Ref Range   WBC 6.6 4.0 - 10.5 K/uL   RBC 2.49 (L) 3.87 - 5.11 MIL/uL   Hemoglobin 6.8 (LL) 12.0 - 15.0 g/dL    Comment: REPEATED TO VERIFY DELTA CHECK NOTED THIS CRITICAL RESULT HAS VERIFIED AND  BEEN CALLED TO L. ABIERA,RN BY TERRAN TYSOR ON 10 22 2020 AT 0424, AND HAS BEEN READ BACK.     HCT 20.5 (L) 36.0 - 46.0 %   MCV 82.3 80.0 - 100.0 fL   MCH 27.3 26.0 - 34.0 pg   MCHC 33.2 30.0 - 36.0 g/dL   RDW 62.1 30.8 - 65.7 %   Platelets 331 150 - 400 K/uL   nRBC 0.0 0.0 - 0.2 %    Comment: Performed at [facility], [address], [place], [place] [address]  TSH     Status: None   Collection Time: 04/15/19  2:55 AM  Result Value Ref Range   TSH 1.861 0.350 - 4.500 uIU/mL    Comment: Performed by a 3rd Generation assay with a functional sensitivity of <=0.01 uIU/mL. Performed at [facility], [address] [place], [place] [address]   T4, free     Status: Abnormal   Collection Time: 04/15/19  2:55 AM  Result Value Ref Range   Free T4 1.18 (H) 0.61 - 1.12 ng/dL    Comment: (NOTE) Biotin ingestion may interfere with free T4 tests. If the results are inconsistent with the TSH level, previous test results, or the clinical presentation, then consider biotin interference. If needed, order repeat testing after stopping biotin. Performed at  [facility], [address] [place], [place] [address]   HIV Antibody (routine testing w rflx)     Status: None   Collection Time: 04/15/19  2:55 AM  Result Value Ref Range   HIV Screen 4th Generation wRfx NON REACTIVE NON REACTIVE    Comment: Performed at [facility], [address] [place], [place] [address]  Prepare RBC     Status: None   Collection Time: 04/15/19  4:39 AM  Result Value Ref Range   Order Confirmation      ORDER PROCESSED BY BLOOD BANK Performed at [facility], [address], [place], [place] [address]   Type and screen [facility]     Status: None (Preliminary result)   Collection Time: 04/15/19  5:14 AM  Result Value Ref Range   ABO/RH(D) A NEG    Antibody Screen NEG    Sample Expiration 04/18/2019,2359    Unit Number [id number]    Blood Component Type RED  CELLS,LR    Unit division 00    Status of Unit ISSUED    Transfusion Status OK TO TRANSFUSE    Crossmatch Result      Compatible Performed at [facility], [address] [place], [place] [address]   ABO/Rh     Status: None   Collection Time: 04/15/19  5:14 AM  Result Value Ref Range   ABO/RH(D)      A NEG Performed at [facility], [address] [place], [place] [address]   Comprehensive metabolic panel     Status: Abnormal   Collection Time: 04/15/19  8:22 AM  Result Value Ref Range   Sodium 136 135 - 145 mmol/L   Potassium 4.2 3.5 - 5.1 mmol/L   Chloride 102 98 - 111 mmol/L   CO2 22 22 - 32 mmol/L   Glucose, Bld 98 70 - 99 mg/dL   BUN 51 (H) 6 - 20 mg/dL   Creatinine, Ser 9.52 (H) 0.44 - 1.00 mg/dL   Calcium 8.0 (L) 8.9 - 10.3 mg/dL   Total Protein 5.4 (L) 6.5 - 8.1 g/dL   Albumin 2.3 (L) 3.5 - 5.0 g/dL   AST 33 15 - 41 U/L   ALT 20 0 - 44 U/L   Alkaline Phosphatase 38 38 - 126 U/L   Total Bilirubin 0.9 0.3 - 1.2 mg/dL   GFR calc non Af Amer 21 (L) >60 mL/min   GFR calc Af Amer 24 (L) >60 mL/min   Anion gap 12 5 - 15    Comment: Performed at [facility], [address], [place], [place] [address]  Magnesium     Status: Abnormal   Collection Time: 04/15/19  8:22 AM  Result Value Ref Range   Magnesium 2.6 (H) 1.7 - 2.4 mg/dL    Comment: Performed at [facility], [address] [place], [place] [address]  Lactic acid, plasma     Status: None   Collection Time: 04/15/19  8:22 AM  Result Value Ref Range   Lactic Acid, Venous 0.9 0.5 - 1.9 mmol/L    Comment: Performed at [facility], [address], [place], [place] [address]  Lactate dehydrogenase     Status: Abnormal   Collection Time: 04/15/19  8:22 AM  Result Value Ref Range   LDH 317 (H) 98 - 192 U/L    Comment: Performed at [facility], [address], [place], [place] [address]  Lactic acid, plasma     Status: None   Collection Time: 04/15/19 10:05 AM  Result Value  Ref Range   Lactic Acid, Venous 0.8 0.5 - 1.9 mmol/L    Comment: Performed at [facility], [address], [place], [place] [address]  CK     Status: Abnormal   Collection Time: 04/15/19 10:05 AM  Result Value Ref Range   Total CK 790 (H) 38 - 234 U/L    Comment: Performed at [facility], [address], [place], [place] [address]  Occult blood card to lab, stool     Status: Abnormal   Collection Time: 04/15/19 10:49 AM  Result Value Ref Range   Fecal Occult Bld POSITIVE (A) NEGATIVE    Comment: Performed at [facility], [address], [place], [place] [address]  Save Smear     Status: None   Collection Time: 04/15/19 11:47 AM  Result Value Ref Range   Smear Review SMEAR STAINED AND AVAILABLE FOR REVIEW     Comment: Performed at [facility], [address] [place], [place] [address]     ROS:  Pertinent items noted in HPI and remainder of comprehensive ROS otherwise negative.  Physical Exam: Vitals:   04/15/19 1134 04/15/19 1249  BP: 123/79 124/84  Pulse: (!) 112 (!) 115  Resp: 16 18  Temp: 99.8 F (37.7 C) (!) 100.9 F (38.3 C)  SpO2: 95% 100%     General: awake, alert, pleasant female sitting up in bed in NAD HEENT: [place]/AT, NGT in place with dark brown output  Eyes: conjunctiva normal, EOMI Neck: supple  Heart: tachycardic, regular rhythm Lungs: normal work of breathing; lungs CTAB Abdomen: BS+, soft, non-distended, generalized tenderness throughout, no peritoneal signs  Extremities: no edema Skin: warm and dry  Neuro: A&Ox3; no focal deficits   Assessment/Plan: 1. AKI - crt 3.8 on admission with previously normal crt in 12/2017. Likely multifactorial in the setting volume depletion from copious emesis and subsequent ABLA. Creatinine improving with volume resuscitation. Would continue NS at current rate of 150 ml/hr unless output from NGT increases significantly. She is having good urine output. Elytes stable. No indication for HD. Do not  suspect ultrasound findings have any clinical significance of underlying chronic process, but will not be able to determine this until we see what her renal function improves to after this acute insult. If she does not return to normal, would pursue further work-up at that time.  2. Acute on chronic anemia secondary to UGIB. Received 1 unit PRBCs; continue to trend CBC and transfuse as needed. Baseline around 9-11. Has had low iron in the past. Will repeat iron studies and supplement if indicated.   Kim Myers 04/15/2019, 1:43 PM   Patient seen and examined, agree with above note with above modifications.  28 year old white female with history of anorexia and hypogonadotrophic hypogonadism gonadism.  She also has a history of a Meckel's diverticulum surgery in the past.  Her creatinine was 0.7 in July 2019.  She presented with GI symptoms of nausea and vomiting discovered to have a small bowel obstruction and volume depletion.  Creatinine was 3.8 and urinalysis showed granular casts.  After the large amount of volume repletion creatinine has improved to 2.9.  Renal ultrasound shows slightly asymmetric kidneys with different echogenicity.  Her urinalysis after she was more volume replete was negative for cells and showed only 30 of protein.  Is difficult to know if the imaging is clinically significant.  From a kidney function and urinalysis standpoint this is just looking like volume depletion.  She been treated appropriately and will continue with IV fluid administration and watch the trend of her kidney function.  I do not believe any further work-up is needed at this time until we know what her kidney function baseline is going to be after these events  Annie Sable, MD 04/15/2019

## 2019-04-15 NOTE — Progress Notes (Signed)
CRITICAL VALUE ALERT  Critical Value:Hg 6.8  Date & Time Notied: 6599 04/15/19  Provider Notified:S. Aslam, Md  Orders Received/Actions taken:yes

## 2019-04-15 NOTE — Progress Notes (Signed)
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 Harlow Asa in 2011  H/o GIB - increased protonix to BID  SIRS Abdominal Pain Intractable Nausea and Emesis Enteritis Ileus vs SBO on CT - unsure of etiology of symptoms but possible this is enteritis compounded by severe dehydration/ ARF - recommend medical management of symptoms,  - no peritonitis on exam, no acute surgical intervention indicated at this time but we will follow ALBA - per medicine, blood this am, Hgb 6.8  FEN: NPO, NGT, ice chips VTE: SCD's, lovenox per medicine ID: Rocephin 10/21>>   Tmax 100.7, WBC 6.6 Foley: none Follow up: TBD  DISPO: await return of bowel function before removing NGT, getting blood this am, increased protonix to BID with hx of GIB and NGT output is dark    LOS: 1 day    Subjective: CC: lower abdominal pain  Pt states she is urinating but unable to completely void. She is having lower abdominal discomfort that is different than her presenting abdominal pain. She is feeling better overall. She had a fever last night. No flatus or BM.    Objective: Vital signs in last 24 hours: Temp:  [98.9 F (37.2 C)-100.7 F (38.2 C)] 99.9 F (37.7 C) (10/22 0816) Pulse Rate:  [104-127] 117 (10/22 0816) Resp:  [14-32] 16 (10/22 0816) BP: (104-131)/(50-81) 123/81 (10/22 0816) SpO2:  [93 %-100 %] 93 % (10/22 0816) Weight:  [61.2 kg] 61.2 kg (10/22 0017) Last BM Date: 04/12/19  Intake/Output from previous day: 10/21 0701 - 10/22 0700 In: 5000 [I.V.:1900; IV Piggyback:3100] Out: 850 [Urine:500; Emesis/NG output:350] Intake/Output this shift: Total I/O In: -  Out: 400 [Urine:400]  PE: Gen:  Alert, NAD, pleasant, cooperative HEENT: NGT in place with dark brown output Pulm:  Rate and effort normal Abd: Soft, ND, few BS heard, generalized TTP with guarding worse in the R  hemiabdomen. No peritonitis  Skin: no rashes noted, warm and dry   Anti-infectives: Anti-infectives (From admission, onward)   Start     Dose/Rate Route Frequency Ordered Stop   04/14/19 1100  cefTRIAXone (ROCEPHIN) 1 g in sodium chloride 0.9 % 100 mL IVPB     1 g 200 mL/hr over 30 Minutes Intravenous Every 24 hours 04/14/19 1055        Lab Results:  Recent Labs    04/14/19 0846 04/15/19 0255  WBC 11.1* 6.6  HGB 9.5* 6.8*  HCT 29.1* 20.5*  PLT 458* 331   BMET Recent Labs    04/14/19 0846 04/15/19 0255  NA 128* 135  K 4.7 4.7  CL 88* 101  CO2 22 24  GLUCOSE 113* 99  BUN 67* 55*  CREATININE 3.83* 3.31*  CALCIUM 8.9 7.8*   PT/INR Recent Labs    04/14/19 1055  LABPROT 15.9*  INR 1.3*   CMP     Component Value Date/Time   NA 135 04/15/2019 0255   NA 127 (L) 12/22/2017 1449   K 4.7 04/15/2019 0255   CL 101 04/15/2019 0255   CO2 24 04/15/2019 0255   GLUCOSE 99 04/15/2019 0255   BUN 55 (H) 04/15/2019 0255   BUN 9 12/22/2017 1449   CREATININE 3.31 (H) 04/15/2019 0255   CALCIUM 7.8 (L) 04/15/2019 0255   PROT 5.4 (L) 04/15/2019 0255   PROT 6.8 12/22/2017 1449   ALBUMIN 2.3 (L) 04/15/2019 0255   ALBUMIN 5.0 12/22/2017 1449   AST 29 04/15/2019  0255   ALT 17 04/15/2019 0255   ALKPHOS 37 (L) 04/15/2019 0255   BILITOT 0.6 04/15/2019 0255   BILITOT 0.2 12/22/2017 1449   GFRNONAA 18 (L) 04/15/2019 0255   GFRAA 21 (L) 04/15/2019 0255   Lipase     Component Value Date/Time   LIPASE 18 04/14/2019 0846    Studies/Results: Ct Abdomen Pelvis Wo Contrast  Result Date: 04/14/2019 CLINICAL DATA:  28 year old female with abdominal pain. EXAM: CT ABDOMEN AND PELVIS WITHOUT CONTRAST TECHNIQUE: Multidetector CT imaging of the abdomen and pelvis was performed following the standard protocol without IV contrast. COMPARISON:  CT of the chest abdomen pelvis dated 04/20/2017 FINDINGS: Evaluation of this exam is limited in the absence of intravenous contrast. Lower chest:  Partially visualized small left and probable trace right pleural effusion. The visualized lung bases are clear. There is hypoattenuation of the cardiac blood pool suggestive of a degree of anemia. Clinical correlation is recommended. Partially visualized anterior pericardial effusion measuring up to 7 mm in thickness. No intra-abdominal free air. Small to moderate ascites. Hepatobiliary: No focal liver abnormality is seen. No gallstones, gallbladder wall thickening, or biliary dilatation. Pancreas: Unremarkable. No pancreatic ductal dilatation or surrounding inflammatory changes. Spleen: Normal in size without focal abnormality. Adrenals/Urinary Tract: The adrenal glands are unremarkable. There is no hydronephrosis or nephrolithiasis on either side. The visualized ureters and urinary bladder appear unremarkable. Stomach/Bowel: Evaluation of the bowel is very limited in the absence of oral contrast and secondary to ascites. There is postsurgical changes of bowel with anastomotic suture in the left upper abdomen. Diffusely dilated air-filled loops of small bowel throughout the abdomen measure up to 3.3 cm in diameter. A definite transition point is not identified. There is however apparent area of tethering of the adjacent small bowel (series 3, image 50). Findings therefore may represent enteritis with associated ileus versus small-bowel obstruction secondary to adhesions. Clinical correlation is recommended. CT with oral contrast or small-bowel series may provide better evaluation. The colon appears unremarkable. Appendectomy. Vascular/Lymphatic: The abdominal aorta and IVC are unremarkable. No portal venous gas. There is no adenopathy. Reproductive: The uterus is grossly unremarkable. Probable 3 cm left ovarian dominant follicle or corpus luteum. Other: Small fat containing supraumbilical hernia. Musculoskeletal: No acute or significant osseous findings. IMPRESSION: 1. Diffusely dilated air-filled loops of small  bowel throughout the abdomen may represent enteritis with associated ileus versus small-bowel obstruction. CT with oral contrast or small-bowel series may provide better evaluation. 2. Small to moderate ascites. 3. No hydronephrosis or nephrolithiasis. Electronically Signed   By: Elgie Collard M.D.   On: 04/14/2019 11:10   US Renal  Result Date: 04/14/2019 CLINICAL DATA:  Acute kidney injury EXAM: RENAL / URINARY TRACT ULTRASOUND COMPLETE COMPARISON:  CT 04/14/2019 FINDINGS: Right Kidney: Renal measurements: 11.8 x 4.6 x 6.7 cm = volume: 191 mL. Mildly increased renal cortical echogenicity. No mass or hydronephrosis visualized. Left Kidney: Renal measurements: 14.4 x 5.9 x 5.0 cm = volume: 223 mL. Echogenicity within normal limits. No mass or hydronephrosis visualized. Bladder: Appears normal for degree of bladder distention. Other: Small to moderate volume ascites throughout the abdomen. IMPRESSION: 1. Negative for obstructive uropathy. 2. Slightly increased renal cortical echogenicity of the right kidney, which could reflect medical renal disease. 3. Left kidney is asymmetrically large relative to the right suggesting compensatory hypertrophy. 4. Small to moderate volume ascites. Electronically Signed   By: Duanne Guess M.D.   On: 04/14/2019 15:14   Dg Abd Portable 1v  Result Date:  04/14/2019 CLINICAL DATA:  NG tube placement EXAM: PORTABLE ABDOMEN - 1 VIEW COMPARISON:  CT abdomen pelvis from the same day FINDINGS: An enteric tube terminates in the stomach. The visible lung bases are clear. The bowel gas pattern is nonobstructive. IMPRESSION: Enteric tube terminates in the stomach. Electronically Signed   By: Romona Curlsyler  Litton M.D.   On: 04/14/2019 17:14     Jerre SimonJessica L Emanuel Dowson, [facility] Pager [phone] Horald ChestnutM, T, W, & Friday 7:00am - 4:30pm Thursdays 7:00am -11:30am

## 2019-04-15 NOTE — Progress Notes (Signed)
Orders for bladder scan received. Patient up to bathroom, voided 400 ml. Post void bladder scan is 400. MD notified at bedside, new orders received. Will place in and out catheter.

## 2019-04-16 ENCOUNTER — Inpatient Hospital Stay (HOSPITAL_COMMUNITY): Payer: BC Managed Care – PPO

## 2019-04-16 DIAGNOSIS — N3001 Acute cystitis with hematuria: Secondary | ICD-10-CM | POA: Insufficient documentation

## 2019-04-16 DIAGNOSIS — Z8719 Personal history of other diseases of the digestive system: Secondary | ICD-10-CM

## 2019-04-16 LAB — TYPE AND SCREEN
ABO/RH(D): A NEG
Antibody Screen: NEGATIVE
Unit division: 0

## 2019-04-16 LAB — BPAM RBC
Blood Product Expiration Date: 202011192359
ISSUE DATE / TIME: 202010220736
Unit Type and Rh: 600

## 2019-04-16 LAB — CBC
HCT: 24.3 % — ABNORMAL LOW (ref 36.0–46.0)
Hemoglobin: 7.9 g/dL — ABNORMAL LOW (ref 12.0–15.0)
MCH: 27 pg (ref 26.0–34.0)
MCHC: 32.5 g/dL (ref 30.0–36.0)
MCV: 82.9 fL (ref 80.0–100.0)
Platelets: 379 10*3/uL (ref 150–400)
RBC: 2.93 MIL/uL — ABNORMAL LOW (ref 3.87–5.11)
RDW: 13.5 % (ref 11.5–15.5)
WBC: 9 10*3/uL (ref 4.0–10.5)
nRBC: 0 % (ref 0.0–0.2)

## 2019-04-16 LAB — COMPREHENSIVE METABOLIC PANEL
ALT: 34 U/L (ref 0–44)
AST: 53 U/L — ABNORMAL HIGH (ref 15–41)
Albumin: 2.2 g/dL — ABNORMAL LOW (ref 3.5–5.0)
Alkaline Phosphatase: 43 U/L (ref 38–126)
Anion gap: 12 (ref 5–15)
BUN: 46 mg/dL — ABNORMAL HIGH (ref 6–20)
CO2: 20 mmol/L — ABNORMAL LOW (ref 22–32)
Calcium: 8.4 mg/dL — ABNORMAL LOW (ref 8.9–10.3)
Chloride: 110 mmol/L (ref 98–111)
Creatinine, Ser: 2.5 mg/dL — ABNORMAL HIGH (ref 0.44–1.00)
GFR calc Af Amer: 29 mL/min — ABNORMAL LOW (ref >60)
GFR calc non Af Amer: 25 mL/min — ABNORMAL LOW (ref >60)
Glucose, Bld: 105 mg/dL — ABNORMAL HIGH (ref 70–99)
Potassium: 4.3 mmol/L (ref 3.5–5.1)
Sodium: 142 mmol/L (ref 135–145)
Total Bilirubin: 1 mg/dL (ref 0.3–1.2)
Total Protein: 5.6 g/dL — ABNORMAL LOW (ref 6.5–8.1)

## 2019-04-16 LAB — IRON AND TIBC
Iron: 9 ug/dL — ABNORMAL LOW (ref 28–170)
Saturation Ratios: 4 % — ABNORMAL LOW (ref 10.4–31.8)
TIBC: 232 ug/dL — ABNORMAL LOW (ref 250–450)
UIBC: 223 ug/dL

## 2019-04-16 LAB — FERRITIN: Ferritin: 300 ng/mL (ref 11–307)

## 2019-04-16 LAB — T3: T3, Total: 47 ng/dL — ABNORMAL LOW (ref 71–180)

## 2019-04-16 LAB — PATHOLOGIST SMEAR REVIEW

## 2019-04-16 MED ORDER — DIATRIZOATE MEGLUMINE & SODIUM 66-10 % PO SOLN
90.0000 mL | Freq: Once | ORAL | Status: AC
  Administered 2019-04-16: 90 mL via NASOGASTRIC
  Filled 2019-04-16: qty 90

## 2019-04-16 NOTE — Progress Notes (Signed)
[facility]    NEPHROLOGY PROGRESS NOTE  SUBJECTIVE: Patient seen and examined.  Reports abdomen is slightly better.  Denies chest pain, shortness of breath, or dysuria.  Continues to have NG tube.  Serum creatinine is improving.  All other review of systems are negative.   OBJECTIVE:  Vitals:   04/16/19 1225 04/16/19 1616  BP: 138/88 (!) 137/95  Pulse: (!) 112 100  Resp: 18   Temp: 100 F (37.8 C) 99.3 F (37.4 C)  SpO2: 97% 98%    Intake/Output Summary (Last 24 hours) at 04/16/2019 1707 Last data filed at 04/16/2019 1600 Gross per 24 hour  Intake 4592.5 ml  Output 4050 ml  Net 542.5 ml      General:  AAOx3 NAD HEENT: MMM [place] AT anicteric sclera, positive NG tube Neck:  No JVD, no adenopathy CV:  Heart RRR  Lungs:  L/S CTA bilaterally Abd:  abd with mild distention, with normal BS GU:  Bladder non-palpable Extremities:  No LE edema. Skin:  No skin rash  MEDICATIONS:  . folic acid  1 mg Oral Daily  . multivitamin  1 tablet Oral Daily  . pantoprazole (PROTONIX) IV  40 mg Intravenous Q12H  . ramelteon  8 mg Oral Once  . thiamine  100 mg Oral Daily       LABS:   CBC Latest Ref Rng & Units 04/16/2019 04/15/2019 04/15/2019  WBC 4.0 - 10.5 K/uL 9.0 8.7 -  Hemoglobin 12.0 - 15.0 g/dL 7.9(L) 8.0(L) 8.2(L)  Hematocrit 36.0 - 46.0 % 24.3(L) 23.8(L) 25.0(L)  Platelets 150 - 400 K/uL 379 356 -    CMP Latest Ref Rng & Units 04/16/2019 04/15/2019 04/15/2019  Glucose 70 - 99 mg/dL 105(H) 98 99  BUN 6 - 20 mg/dL 46(H) 51(H) 55(H)  Creatinine 0.44 - 1.00 mg/dL 2.50(H) 2.94(H) 3.31(H)  Sodium 135 - 145 mmol/L 142 136 135  Potassium 3.5 - 5.1 mmol/L 4.3 4.2 4.7  Chloride 98 - 111 mmol/L 110 102 101  CO2 22 - 32 mmol/L 20(L) 22 24  Calcium 8.9 - 10.3 mg/dL 8.4(L) 8.0(L) 7.8(L)  Total Protein 6.5 - 8.1 g/dL 5.6(L) 5.4(L) 5.4(L)  Total Bilirubin 0.3 - 1.2 mg/dL 1.0 0.9 0.6  Alkaline Phos 38 - 126 U/L 43 38 37(L)  AST 15 - 41 U/L 53(H) 33 29  ALT 0 - 44  U/L 34 20 17    Lab Results  Component Value Date   CALCIUM 8.4 (L) 04/16/2019   CAION 1.09 (L) 04/20/2017   PHOS 4.7 (H) 04/14/2019       Component Value Date/Time   COLORURINE YELLOW 04/14/2019 1159   APPEARANCEUR CLOUDY (A) 04/14/2019 1159   LABSPEC 1.019 04/14/2019 1159   PHURINE 5.0 04/14/2019 1159   GLUCOSEU NEGATIVE 04/14/2019 1159   HGBUR MODERATE (A) 04/14/2019 1159   BILIRUBINUR NEGATIVE 04/14/2019 1159   KETONESUR NEGATIVE 04/14/2019 1159   PROTEINUR 30 (A) 04/14/2019 1159   UROBILINOGEN 0.2 08/26/2009 1449   NITRITE NEGATIVE 04/14/2019 1159   LEUKOCYTESUR NEGATIVE 04/14/2019 1159      Component Value Date/Time   TCO2 23 04/20/2017 1307       Component Value Date/Time   IRON 9 (L) 04/16/2019 0653   TIBC 232 (L) 04/16/2019 0653   FERRITIN 300 04/16/2019 0653   FERRITIN 21 12/22/2017 1449   IRONPCTSAT 4 (L) 04/16/2019 0653       ASSESSMENT/PLAN:    1. AKI - crt 3.8 on admission with previously normal crt in 12/2017.  Likely multifactorial in the setting volume depletion from copious emesis and subsequent ABLA. Creatinine improving with volume resuscitation. Urine sediment likely due to recent UTI.  Would continue NS at current rate unless output from NGT increases significantly. She is having good urine output. Elytes stable. No indication for HD. Do not suspect ultrasound findings have any clinical significance of underlying chronic process, but will not be able to determine this until we see what her renal function improves to after this acute insult. If she does not return to normal, would pursue further work-up at that time.  2. Acute on chronic anemia secondary to UGIB. Received 1 unit PRBCs; continue to trend CBC and transfuse as needed. Baseline around 9-11.  Would recommend IV iron replacement.   [address] [place], DO, [place]

## 2019-04-16 NOTE — Progress Notes (Signed)
Patient is switched to the small telemetry box. CCMD called

## 2019-04-16 NOTE — Progress Notes (Signed)
Patient got up to go to the rrestroom, Mds aware of elevated heart rate. Will continue to monitor.

## 2019-04-16 NOTE — Progress Notes (Signed)
Patient ID: Kim Myers, female   DOB: 1991/02/10, 28 y.o.   MRN: [id number]       Subjective: Patient states abdominal pain is improving, but still bloated and without bowel function.  NGT still with bilious output.  Desperate for a sip of juice.  Objective: Vital signs in last 24 hours: Temp:  [98.2 F (36.8 C)-100.9 F (38.3 C)] 98.9 F (37.2 C) (10/23 0756) Pulse Rate:  [103-115] 104 (10/23 0756) Resp:  [16-18] 18 (10/23 0756) BP: (123-132)/(79-91) 126/91 (10/23 0756) SpO2:  [92 %-100 %] 98 % (10/23 0756) Weight:  [60.4 kg-61.3 kg] 61.3 kg (10/23 0615) Last BM Date: 04/12/19  Intake/Output from previous day: 10/22 0701 - 10/23 0700 In: 3433.1 [P.O.:30; I.V.:2524.7; IV Piggyback:878.3] Out: 3750 [Urine:2300; Emesis/NG output:1450] Intake/Output this shift: No intake/output data recorded.  PE: Heart: tachy Lungs: CTAB Abd: distended, minimally tender, NGT with bilious output, hypoactive BS  Lab Results:  Recent Labs    04/15/19 2212 04/16/19 0653  WBC 8.7 9.0  HGB 8.0* 7.9*  HCT 23.8* 24.3*  PLT 356 379   BMET Recent Labs    04/15/19 0255 04/15/19 0822  NA 135 136  K 4.7 4.2  CL 101 102  CO2 24 22  GLUCOSE 99 98  BUN 55* 51*  CREATININE 3.31* 2.94*  CALCIUM 7.8* 8.0*   PT/INR Recent Labs    04/14/19 1055  LABPROT 15.9*  INR 1.3*   CMP     Component Value Date/Time   NA 136 04/15/2019 0822   NA 127 (L) 12/22/2017 1449   K 4.2 04/15/2019 0822   CL 102 04/15/2019 0822   CO2 22 04/15/2019 0822   GLUCOSE 98 04/15/2019 0822   BUN 51 (H) 04/15/2019 0822   BUN 9 12/22/2017 1449   CREATININE 2.94 (H) 04/15/2019 0822   CALCIUM 8.0 (L) 04/15/2019 0822   PROT 5.4 (L) 04/15/2019 0822   PROT 6.8 12/22/2017 1449   ALBUMIN 2.3 (L) 04/15/2019 0822   ALBUMIN 5.0 12/22/2017 1449   AST 33 04/15/2019 0822   ALT 20 04/15/2019 0822   ALKPHOS 38 04/15/2019 0822   BILITOT 0.9 04/15/2019 0822   BILITOT 0.2 12/22/2017 1449   GFRNONAA 21 (L) 04/15/2019 0822    GFRAA 24 (L) 04/15/2019 0822   Lipase     Component Value Date/Time   LIPASE 18 04/14/2019 0846       Studies/Results: Ct Abdomen Pelvis Wo Contrast  Result Date: 04/14/2019 CLINICAL DATA:  28 year old female with abdominal pain. EXAM: CT ABDOMEN AND PELVIS WITHOUT CONTRAST TECHNIQUE: Multidetector CT imaging of the abdomen and pelvis was performed following the standard protocol without IV contrast. COMPARISON:  CT of the chest abdomen pelvis dated 04/20/2017 FINDINGS: Evaluation of this exam is limited in the absence of intravenous contrast. Lower chest: Partially visualized small left and probable trace right pleural effusion. The visualized lung bases are clear. There is hypoattenuation of the cardiac blood pool suggestive of a degree of anemia. Clinical correlation is recommended. Partially visualized anterior pericardial effusion measuring up to 7 mm in thickness. No intra-abdominal free air. Small to moderate ascites. Hepatobiliary: No focal liver abnormality is seen. No gallstones, gallbladder wall thickening, or biliary dilatation. Pancreas: Unremarkable. No pancreatic ductal dilatation or surrounding inflammatory changes. Spleen: Normal in size without focal abnormality. Adrenals/Urinary Tract: The adrenal glands are unremarkable. There is no hydronephrosis or nephrolithiasis on either side. The visualized ureters and urinary bladder appear unremarkable. Stomach/Bowel: Evaluation of the bowel is very limited in the absence  of oral contrast and secondary to ascites. There is postsurgical changes of bowel with anastomotic suture in the left upper abdomen. Diffusely dilated air-filled loops of small bowel throughout the abdomen measure up to 3.3 cm in diameter. A definite transition point is not identified. There is however apparent area of tethering of the adjacent small bowel (series 3, image 50). Findings therefore may represent enteritis with associated ileus versus small-bowel  obstruction secondary to adhesions. Clinical correlation is recommended. CT with oral contrast or small-bowel series may provide better evaluation. The colon appears unremarkable. Appendectomy. Vascular/Lymphatic: The abdominal aorta and IVC are unremarkable. No portal venous gas. There is no adenopathy. Reproductive: The uterus is grossly unremarkable. Probable 3 cm left ovarian dominant follicle or corpus luteum. Other: Small fat containing supraumbilical hernia. Musculoskeletal: No acute or significant osseous findings. IMPRESSION: 1. Diffusely dilated air-filled loops of small bowel throughout the abdomen may represent enteritis with associated ileus versus small-bowel obstruction. CT with oral contrast or small-bowel series may provide better evaluation. 2. Small to moderate ascites. 3. No hydronephrosis or nephrolithiasis. Electronically Signed   By: Anner Crete M.D.   On: 04/14/2019 11:10   US Renal  Result Date: 04/14/2019 CLINICAL DATA:  Acute kidney injury EXAM: RENAL / URINARY TRACT ULTRASOUND COMPLETE COMPARISON:  CT 04/14/2019 FINDINGS: Right Kidney: Renal measurements: 11.8 x 4.6 x 6.7 cm = volume: 191 mL. Mildly increased renal cortical echogenicity. No mass or hydronephrosis visualized. Left Kidney: Renal measurements: 14.4 x 5.9 x 5.0 cm = volume: 223 mL. Echogenicity within normal limits. No mass or hydronephrosis visualized. Bladder: Appears normal for degree of bladder distention. Other: Small to moderate volume ascites throughout the abdomen. IMPRESSION: 1. Negative for obstructive uropathy. 2. Slightly increased renal cortical echogenicity of the right kidney, which could reflect medical renal disease. 3. Left kidney is asymmetrically large relative to the right suggesting compensatory hypertrophy. 4. Small to moderate volume ascites. Electronically Signed   By: Davina Poke M.D.   On: 04/14/2019 15:14   Dg Abd Portable 1v  Result Date: 04/14/2019 CLINICAL DATA:  NG tube  placement EXAM: PORTABLE ABDOMEN - 1 VIEW COMPARISON:  CT abdomen pelvis from the same day FINDINGS: An enteric tube terminates in the stomach. The visible lung bases are clear. The bowel gas pattern is nonobstructive. IMPRESSION: Enteric tube terminates in the stomach. Electronically Signed   By: Zerita Boers M.D.   On: 04/14/2019 17:14    Anti-infectives: Anti-infectives (From admission, onward)   Start     Dose/Rate Route Frequency Ordered Stop   04/15/19 1200  levofloxacin (LEVAQUIN) IVPB 750 mg  Status:  Discontinued     750 mg 100 mL/hr over 90 Minutes Intravenous Every 24 hours 04/15/19 1127 04/15/19 1149   04/15/19 1200  levofloxacin (LEVAQUIN) IVPB 750 mg     750 mg 100 mL/hr over 90 Minutes Intravenous Every 48 hours 04/15/19 1149     04/14/19 1100  cefTRIAXone (ROCEPHIN) 1 g in sodium chloride 0.9 % 100 mL IVPB  Status:  Discontinued     1 g 200 mL/hr over 30 Minutes Intravenous Every 24 hours 04/14/19 1055 04/15/19 1127       Assessment/Plan SIRS ABL anemia - s/p tx yesterday for hgb of 6.8, repeat labs pending ARF - likely in the setting of volume loss, nephrology on board, repeat labs pending Ileus vs SBO on CT - unsure of etiology of symptoms but possible this is enteritis compounded by severe dehydration/ ARF -will start SBO protocol today  -  may have some ice chips and a few small sips of juice -mobilize   FEN: NPO, NGT, ice chips, SBO  protocol VTE: SCD's, lovenox per medicine ID: Rocephin 10/21>>10/22, Levaquin 10/22-->   Foley: none Follow up: TBD   LOS: 2 days    Letha CapeKelly E Ciarah Peace , [facility] 04/16/2019, 8:21 AM Please see Amion for pager number during day hours 7:00am-4:30pm

## 2019-04-16 NOTE — Progress Notes (Signed)
Subjective: Patient was seen this morning on rounds.  She states that her abdominal pain has improved overnight and she was able to tolerate sips of juice.  Patient states that she still has not been able to have a bowel movement overnight and is not passing any gas.  Patient denies chest pain, shortness of breath, fever, dizziness, nausea or vomiting.  Objective:  Vital signs in last 24 hours: Vitals:   04/16/19 0445 04/16/19 0615 04/16/19 0756 04/16/19 1225  BP: 132/84  (!) 126/91 138/88  Pulse: (!) 110  (!) 104 (!) 112  Resp: 16  18 18   Temp: 98.2 F (36.8 C)  98.9 F (37.2 C) 100 F (37.8 C)  TempSrc: Oral  Oral Oral  SpO2: 96%  98% 97%  Weight:  61.3 kg    Height:        Physical Exam: Physical Exam  Constitutional: She is oriented to person, place, and time. No distress.  Eyes: EOM are normal.  Neck: Normal range of motion.  Cardiovascular: Regular rhythm, normal heart sounds and intact distal pulses. Tachycardia present. Exam reveals no gallop and no friction rub.  No murmur heard. Pulmonary/Chest: Effort normal and breath sounds normal. No respiratory distress. She exhibits no tenderness.  Abdominal: She exhibits distension. She exhibits no mass. There is abdominal tenderness (diffuse). There is no guarding.  Musculoskeletal: Normal range of motion.  Neurological: She is alert and oriented to person, place, and time.  Skin: Skin is warm and dry. Rash (round erythematous, scaley lesion with raised boarders. (See media for photo)) noted. She is not diaphoretic.    Pertinent labs/Imaging: CBC Latest Ref Rng & Units 04/16/2019 04/15/2019 04/15/2019  WBC 4.0 - 10.5 K/uL 9.0 8.7 -  Hemoglobin 12.0 - 15.0 g/dL 7.9(L) 8.0(L) 8.2(L)  Hematocrit 36.0 - 46.0 % 24.3(L) 23.8(L) 25.0(L)  Platelets 150 - 400 K/uL 379 356 -   CMP Latest Ref Rng & Units 04/16/2019 04/15/2019 04/15/2019  Glucose 70 - 99 mg/dL 04/17/2019) 98 99  BUN 6 - 20 mg/dL 235(T) 73(U) 20(U)  Creatinine 0.44 -  1.00 mg/dL 54(Y) 7.06(C) 3.76(E)  Sodium 135 - 145 mmol/L 142 136 135  Potassium 3.5 - 5.1 mmol/L 4.3 4.2 4.7  Chloride 98 - 111 mmol/L 110 102 101  CO2 22 - 32 mmol/L 20(L) 22 24  Calcium 8.9 - 10.3 mg/dL 8.31(D) 1.7(O) 7.8(L)  Total Protein 6.5 - 8.1 g/dL 1.6(W) 7.3(X) 1.0(G)  Total Bilirubin 0.3 - 1.2 mg/dL 1.0 0.9 0.6  Alkaline Phos 38 - 126 U/L 43 38 37(L)  AST 15 - 41 U/L 53(H) 33 29  ALT 0 - 44 U/L 34 20 17    Assessment/Plan:  Principal Problem:   SBO (small bowel obstruction) (HCC) Active Problems:   Hypothyroidism (acquired)   Amenorrhea   Acute renal injury (HCC)   Elevated TSH   Elevated serum free T4 level   Hyponatremia   Leukocytosis   Anemia    Patient Summary: 29 year old female with a pertinent 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  Clinical course complicated by AKI and upper GI bleed.  #SBO Continues to have abdominal pain but states that it has improved since yesterday.  She had roughly 1400 cc of output on day of admission with another 600 cc in the last 12 hours.  Output today likely bilious  due to dark color. Hemoglobin is stable at 8.0 since being transfused with 1 L of blood yesterday morning.  Patient does not have a leukocytosis or any other signs and symptoms concerning for infection at this time.  Currently on levofloxacin for possible enteritis.  We will continue NG tube suction and conservative management of her small bowel obstruction at this time.  #AKI #UTI Patient's creatinine has improved overnight and is now at 2.5.  Patient's slow response of fluid may indicate acute tubular necrosis in the setting of severe prerenal azotemia.  We will continue to give the patient fluids and monitor her creatinine and GFR.  Patient also has a UTI  that is complicated by signs and symptoms of bladder obstruction in the setting of acute kidney failure.  We will continue her on levofloxacin for 70 days for complicated UTI.  #Anemia Likely iron deficiency anemia secondary to acute blood loss and long-term decrease caloric intake.    Diet: N.p.o. IVF: Normal saline VTE: Heparin Code: Full code  Dispo: Anticipated discharge pending clinical improvement.   Kim Payment, MD 04/16/2019, 1:22 PM Pager: [phone]

## 2019-04-16 NOTE — Progress Notes (Signed)
Patient has been bladder scanned and has 373ml post void volume. MD Seawell aware

## 2019-04-17 DIAGNOSIS — N911 Secondary amenorrhea: Secondary | ICD-10-CM

## 2019-04-17 DIAGNOSIS — E288 Other ovarian dysfunction: Secondary | ICD-10-CM

## 2019-04-17 DIAGNOSIS — B354 Tinea corporis: Secondary | ICD-10-CM

## 2019-04-17 DIAGNOSIS — R630 Anorexia: Secondary | ICD-10-CM

## 2019-04-17 LAB — CBC
HCT: 24.7 % — ABNORMAL LOW (ref 36.0–46.0)
Hemoglobin: 8 g/dL — ABNORMAL LOW (ref 12.0–15.0)
MCH: 27.3 pg (ref 26.0–34.0)
MCHC: 32.4 g/dL (ref 30.0–36.0)
MCV: 84.3 fL (ref 80.0–100.0)
Platelets: 455 10*3/uL — ABNORMAL HIGH (ref 150–400)
RBC: 2.93 MIL/uL — ABNORMAL LOW (ref 3.87–5.11)
RDW: 14.2 % (ref 11.5–15.5)
WBC: 8.5 10*3/uL (ref 4.0–10.5)
nRBC: 0 % (ref 0.0–0.2)

## 2019-04-17 LAB — BASIC METABOLIC PANEL
Anion gap: 13 (ref 5–15)
BUN: 48 mg/dL — ABNORMAL HIGH (ref 6–20)
CO2: 20 mmol/L — ABNORMAL LOW (ref 22–32)
Calcium: 8.6 mg/dL — ABNORMAL LOW (ref 8.9–10.3)
Chloride: 112 mmol/L — ABNORMAL HIGH (ref 98–111)
Creatinine, Ser: 2.22 mg/dL — ABNORMAL HIGH (ref 0.44–1.00)
GFR calc Af Amer: 34 mL/min — ABNORMAL LOW (ref >60)
GFR calc non Af Amer: 29 mL/min — ABNORMAL LOW (ref >60)
Glucose, Bld: 109 mg/dL — ABNORMAL HIGH (ref 70–99)
Potassium: 3.7 mmol/L (ref 3.5–5.1)
Sodium: 145 mmol/L (ref 135–145)

## 2019-04-17 MED ORDER — SODIUM CHLORIDE 0.9 % IV SOLN
510.0000 mg | Freq: Once | INTRAVENOUS | Status: DC
  Filled 2019-04-17: qty 17

## 2019-04-17 MED ORDER — CLOTRIMAZOLE 1 % EX CREA
TOPICAL_CREAM | Freq: Two times a day (BID) | CUTANEOUS | Status: DC
  Administered 2019-04-17 – 2019-04-21 (×7): via TOPICAL
  Administered 2019-04-22: 1 via TOPICAL
  Administered 2019-04-22 – 2019-04-23 (×3): via TOPICAL
  Administered 2019-04-24: 1 via TOPICAL
  Administered 2019-04-24 – 2019-04-30 (×11): via TOPICAL
  Filled 2019-04-17 (×3): qty 15

## 2019-04-17 MED ORDER — RAMELTEON 8 MG PO TABS
8.0000 mg | ORAL_TABLET | Freq: Every day | ORAL | Status: AC
  Administered 2019-04-17: 8 mg via ORAL
  Filled 2019-04-17: qty 1

## 2019-04-17 MED ORDER — SIMETHICONE 80 MG PO CHEW
80.0000 mg | CHEWABLE_TABLET | Freq: Four times a day (QID) | ORAL | Status: DC | PRN
  Administered 2019-04-17 – 2019-04-18 (×2): 80 mg via ORAL
  Filled 2019-04-17 (×3): qty 1

## 2019-04-17 MED ORDER — DOCUSATE SODIUM 100 MG PO CAPS
100.0000 mg | ORAL_CAPSULE | Freq: Two times a day (BID) | ORAL | Status: DC
  Administered 2019-04-17 – 2019-04-19 (×5): 100 mg via ORAL
  Filled 2019-04-17 (×7): qty 1

## 2019-04-17 NOTE — Progress Notes (Signed)
[facility]    NEPHROLOGY PROGRESS NOTE  SUBJECTIVE: Patient seen and examined.  Reports abdomen is slightly better.  Denies chest pain, shortness of breath, or dysuria.  NG tube removed.  Serum creatinine is improving.  All other review of systems are negative.   OBJECTIVE:  Vitals:   04/17/19 0836 04/17/19 1122  BP:  (!) 137/91  Pulse: (!) 104 (!) 128  Resp:  17  Temp:  99.2 F (37.3 C)  SpO2:  100%    Intake/Output Summary (Last 24 hours) at 04/17/2019 1521 Last data filed at 04/17/2019 1500 Gross per 24 hour  Intake 4541.77 ml  Output 4610 ml  Net -68.23 ml      General:  AAOx3 NAD HEENT: MMM [place] AT anicteric sclera, positive NG tube Neck:  No JVD, no adenopathy CV:  Heart RRR  Lungs:  L/S CTA bilaterally Abd:  abd with mild distention, with normal BS GU:  Bladder non-palpable Extremities:  No LE edema. Skin:  No skin rash  MEDICATIONS:  . clotrimazole   Topical BID  . docusate sodium  100 mg Oral BID  . folic acid  1 mg Oral Daily  . multivitamin  1 tablet Oral Daily  . pantoprazole (PROTONIX) IV  40 mg Intravenous Q12H  . ramelteon  8 mg Oral Once  . thiamine  100 mg Oral Daily       LABS:   CBC Latest Ref Rng & Units 04/17/2019 04/16/2019 04/15/2019  WBC 4.0 - 10.5 K/uL 8.5 9.0 8.7  Hemoglobin 12.0 - 15.0 g/dL 8.0(L) 7.9(L) 8.0(L)  Hematocrit 36.0 - 46.0 % 24.7(L) 24.3(L) 23.8(L)  Platelets 150 - 400 K/uL 455(H) 379 356    CMP Latest Ref Rng & Units 04/17/2019 04/16/2019 04/15/2019  Glucose 70 - 99 mg/dL 109(H) 105(H) 98  BUN 6 - 20 mg/dL 48(H) 46(H) 51(H)  Creatinine 0.44 - 1.00 mg/dL 2.22(H) 2.50(H) 2.94(H)  Sodium 135 - 145 mmol/L 145 142 136  Potassium 3.5 - 5.1 mmol/L 3.7 4.3 4.2  Chloride 98 - 111 mmol/L 112(H) 110 102  CO2 22 - 32 mmol/L 20(L) 20(L) 22  Calcium 8.9 - 10.3 mg/dL 8.6(L) 8.4(L) 8.0(L)  Total Protein 6.5 - 8.1 g/dL - 5.6(L) 5.4(L)  Total Bilirubin 0.3 - 1.2 mg/dL - 1.0 0.9  Alkaline Phos 38 - 126 U/L - 43  38  AST 15 - 41 U/L - 53(H) 33  ALT 0 - 44 U/L - 34 20    Lab Results  Component Value Date   CALCIUM 8.6 (L) 04/17/2019   CAION 1.09 (L) 04/20/2017   PHOS 4.7 (H) 04/14/2019       Component Value Date/Time   COLORURINE YELLOW 04/14/2019 1159   APPEARANCEUR CLOUDY (A) 04/14/2019 1159   LABSPEC 1.019 04/14/2019 1159   PHURINE 5.0 04/14/2019 1159   GLUCOSEU NEGATIVE 04/14/2019 1159   HGBUR MODERATE (A) 04/14/2019 1159   BILIRUBINUR NEGATIVE 04/14/2019 1159   KETONESUR NEGATIVE 04/14/2019 1159   PROTEINUR 30 (A) 04/14/2019 1159   UROBILINOGEN 0.2 08/26/2009 1449   NITRITE NEGATIVE 04/14/2019 1159   LEUKOCYTESUR NEGATIVE 04/14/2019 1159      Component Value Date/Time   TCO2 23 04/20/2017 1307       Component Value Date/Time   IRON 9 (L) 04/16/2019 0653   TIBC 232 (L) 04/16/2019 0653   FERRITIN 300 04/16/2019 0653   FERRITIN 21 12/22/2017 1449   IRONPCTSAT 4 (L) 04/16/2019 0653       ASSESSMENT/PLAN:    1.  AKI - crt 3.8 on admission with previously normal crt in 12/2017. Likely multifactorial in the setting volume depletion from copious emesis and subsequent ABLA. Creatinine improving with volume resuscitation. Urine sediment likely due to recent UTI.  Would continue NS at current rate unless output from NGT increases significantly. She is having good urine output. Elytes stable. No indication for HD. Do not suspect ultrasound findings have any clinical significance of underlying chronic process, but will not be able to determine this until we see what her renal function improves to after this acute insult. If she does not return to normal, would pursue further work-up at that time as an OP. 2. Acute on chronic anemia secondary to UGIB. Received 1 unit PRBCs; continue to trend CBC and transfuse as needed. Baseline around 9-11.  Recommend IV iron replacement.  Renal function improving.  Would continue hydration as needed.  Will sign off.  Please call with questions.   Thanks. [address] [employer], DO, [place]

## 2019-04-17 NOTE — Progress Notes (Signed)
[facility] Progress Note     Subjective: CC: sbo vs. Ileus Patient feeling much better, has had a few bowel movements. Denies nausea. Has tolerated NGT clamping.   Objective: Vital signs in last 24 hours: Temp:  [97.6 F (36.4 C)-100 F (37.8 C)] 97.6 F (36.4 C) (10/24 0450) Pulse Rate:  [98-112] 98 (10/24 0450) Resp:  [18-20] 18 (10/24 0450) BP: (126-143)/(84-95) 141/84 (10/24 0450) SpO2:  [97 %-100 %] 100 % (10/24 0450) Weight:  [61.9 kg] 61.9 kg (10/24 0417) Last BM Date: 04/17/19  Intake/Output from previous day: 10/23 0701 - 10/24 0700 In: 4139.8 [P.O.:1140; I.V.:2999.8] Out: 6000 [Urine:1400; Emesis/NG output:4600] Intake/Output this shift: Total I/O In: 2980.3 [P.O.:900; I.V.:2080.3] Out: 2500 [Urine:700; Emesis/NG output:1800]  PE: Gen:  Alert, NAD, pleasant Card:  Regular rate and rhythm, pedal pulses 2+ BL Pulm:  Normal effort, clear to auscultation bilaterally Abd: Soft, non-tender, distended, BS hypoactive Skin: warm and dry, no rashes  Psych: A&Ox3   Lab Results:  Recent Labs    04/16/19 0653 04/17/19 0457  WBC 9.0 8.5  HGB 7.9* 8.0*  HCT 24.3* 24.7*  PLT 379 455*   BMET Recent Labs    04/16/19 0653 04/17/19 0457  NA 142 145  K 4.3 3.7  CL 110 112*  CO2 20* 20*  GLUCOSE 105* 109*  BUN 46* 48*  CREATININE 2.50* 2.22*  CALCIUM 8.4* 8.6*   PT/INR Recent Labs    04/14/19 1055  LABPROT 15.9*  INR 1.3*   CMP     Component Value Date/Time   NA 145 04/17/2019 0457   NA 127 (L) 12/22/2017 1449   K 3.7 04/17/2019 0457   CL 112 (H) 04/17/2019 0457   CO2 20 (L) 04/17/2019 0457   GLUCOSE 109 (H) 04/17/2019 0457   BUN 48 (H) 04/17/2019 0457   BUN 9 12/22/2017 1449   CREATININE 2.22 (H) 04/17/2019 0457   CALCIUM 8.6 (L) 04/17/2019 0457   PROT 5.6 (L) 04/16/2019 0653   PROT 6.8 12/22/2017 1449   ALBUMIN 2.2 (L) 04/16/2019 0653   ALBUMIN 5.0 12/22/2017 1449   AST 53 (H) 04/16/2019 0653   ALT 34 04/16/2019 0653   ALKPHOS 43 04/16/2019 0653   BILITOT 1.0 04/16/2019 0653   BILITOT 0.2 12/22/2017 1449   GFRNONAA 29 (L) 04/17/2019 0457   GFRAA 34 (L) 04/17/2019 0457   Lipase     Component Value Date/Time   LIPASE 18 04/14/2019 0846       Studies/Results: Dg Abd Portable 1v-small Bowel Obstruction Protocol-initial, 8 Hr Delay  Result Date: 04/16/2019 CLINICAL DATA:  Small bowel obstruction EXAM: PORTABLE ABDOMEN - 1 VIEW COMPARISON:  April 14, 2019 FINDINGS: There is a single loop of borderline dilated small bowel in the right abdomen. There is somewhat of a paucity of bowel gas overall. No air-fluid levels evident. No free air evident. IMPRESSION: Relative paucity of bowel gas. There is air in a loop of bowel in the right mid abdomen which is slightly dilated. The appearance is more suggestive of ileus or enteritis than bowel obstruction, although a degree of bowel obstruction cannot excluded in this circumstance. No evident free air. Electronically Signed   By: Lowella Grip III M.D.   On: 04/16/2019 19:30    Anti-infectives: Anti-infectives (From admission, onward)   Start     Dose/Rate Route Frequency Ordered Stop   04/15/19 1200  levofloxacin (LEVAQUIN) IVPB 750 mg  Status:  Discontinued     750 mg 100 mL/hr over 90  Minutes Intravenous Every 24 hours 04/15/19 1127 04/15/19 1149   04/15/19 1200  levofloxacin (LEVAQUIN) IVPB 750 mg     750 mg 100 mL/hr over 90 Minutes Intravenous Every 48 hours 04/15/19 1149     04/14/19 1100  cefTRIAXone (ROCEPHIN) 1 g in sodium chloride 0.9 % 100 mL IVPB  Status:  Discontinued     1 g 200 mL/hr over 30 Minutes Intravenous Every 24 hours 04/14/19 1055 04/15/19 1127       Assessment/Plan SIRS ABL anemia - s/p tx yesterday for hgb of 6.8, repeat labs pending ARF - likely in the setting of volume loss, nephrology on board, repeat labs pending Ileus vs SBO on CT - unsure of etiology of symptoms but possible this is enteritis compounded by severe  dehydration/ ARF -patient still somewhat distended but having bowel function, d/c NGT and allow CLD -mobilize   FEN:CLD, IVF VTE: SCD's, lovenoxper medicine NO:MVEHMCNO 10/21>>10/22, Levaquin 10/22>> Foley:none Follow up:TBD  LOS: 3 days    Wells Guiles , [facility] 04/17/2019, 6:52 AM Please see Amion for pager number during day hours 7:00am-4:30pm

## 2019-04-17 NOTE — Progress Notes (Signed)
Subjective: Patient was seen this morning on rounds.  States that her abdominal pain has significantly improved/ She admits to mild to moderate abdominal discomfort that she contributes to bloating.  Patient diet was advanced to clear liquids and toelrating them well. She states that she has had multiple bowel movements without blood or mucus. Patient admits to feeling much better since NG tube was removed.  She denies nausea or vomiting.      Objective:  Vital signs in last 24 hours: Vitals:   04/17/19 0417 04/17/19 0450 04/17/19 0751 04/17/19 0836  BP:  (!) 141/84 (!) 139/91   Pulse:  98 (!) 121 (!) 104  Resp:  18 18   Temp:  97.6 F (36.4 C) 98.4 F (36.9 C)   TempSrc:  Oral Oral   SpO2:  100% 100%   Weight: 61.9 kg     Height:        Physical Exam: Physical Exam  Constitutional: She is oriented to person, place, and time. No distress.  HENT:  Head: Atraumatic.  Eyes: EOM are normal.  Neck: Normal range of motion.  Cardiovascular: Normal rate, regular rhythm and intact distal pulses. Exam reveals no gallop and no friction rub.  No murmur heard. Pulmonary/Chest: Effort normal and breath sounds normal. No respiratory distress. She exhibits no tenderness.  Abdominal: She exhibits distension. She exhibits no mass. There is abdominal tenderness. There is no rebound and no guarding.  Musculoskeletal: Normal range of motion.  Neurological: She is alert and oriented to person, place, and time.  Skin: Skin is warm and dry. Rash (left kneck) noted. She is not diaphoretic.       Pertinent labs/Imaging: CBC Latest Ref Rng & Units 04/17/2019 04/16/2019 04/15/2019  WBC 4.0 - 10.5 K/uL 8.5 9.0 8.7  Hemoglobin 12.0 - 15.0 g/dL 8.0(L) 7.9(L) 8.0(L)  Hematocrit 36.0 - 46.0 % 24.7(L) 24.3(L) 23.8(L)  Platelets 150 - 400 K/uL 455(H) 379 356   CMP Latest Ref Rng & Units 04/17/2019 04/16/2019 04/15/2019  Glucose 70 - 99 mg/dL 109(H) 105(H) 98  BUN 6 - 20 mg/dL 48(H) 46(H) 51(H)   Creatinine 0.44 - 1.00 mg/dL 2.22(H) 2.50(H) 2.94(H)  Sodium 135 - 145 mmol/L 145 142 136  Potassium 3.5 - 5.1 mmol/L 3.7 4.3 4.2  Chloride 98 - 111 mmol/L 112(H) 110 102  CO2 22 - 32 mmol/L 20(L) 20(L) 22  Calcium 8.9 - 10.3 mg/dL 8.6(L) 8.4(L) 8.0(L)  Total Protein 6.5 - 8.1 g/dL - 5.6(L) 5.4(L)  Total Bilirubin 0.3 - 1.2 mg/dL - 1.0 0.9  Alkaline Phos 38 - 126 U/L - 43 38  AST 15 - 41 U/L - 53(H) 33  ALT 0 - 44 U/L - 34 20      Assessment/Plan:  Principal Problem:   SBO (small bowel obstruction) (HCC) Active Problems:   Hypothyroidism (acquired)   Amenorrhea   Acute renal injury (HCC)   Elevated TSH   Elevated serum free T4 level   Hyponatremia   Leukocytosis   Anemia   Acute cystitis with hematuria    Patient Summary: 28 year old female with a pertinent past medical 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  Clinical course complicated by UTI, AKI, upper GI bleed.  Patient has improved significantly.  She still complains of abdominal distention.  NG tube has been clamped she denies nausea or vomiting.  Tolerating clear liquid  diet.  Has had multiple bowel movements in the last 48 hours.  #SBO #Enteritis - NG tube removed yesterday.  - Pain is well controlled. - Advanced to clear liquid diet and tolerating it well. -Appreciate surgery's recommendations.   #AKI #UTI Patient's kidney function continues to improve.  Creatinine is now 2.22 and GFR of 29. -IV fluids -Continue levofloxacin (Day 3 of AB) - Appreciate nephrology recommendations.   #GI bleed #Iron deficiency anemia -GI consulted. Patient will likely need outpatient follow-up with GI. -Give IV iron before she leaves hospital  Tinea Corporis: Patient has a small scaly rash on her left lower jaw concerning for tinea corporis. -  lotrimin cream bid  Diet: N.p.o. IVF: Normal saline VTE: Heparin Code: Full code  Dispo: Anticipated discharge pending clinical improvement.   Dellia Cloud, MD 04/17/2019, 8:48 AM Pager: [phone]

## 2019-04-18 ENCOUNTER — Inpatient Hospital Stay (HOSPITAL_COMMUNITY): Payer: BC Managed Care – PPO

## 2019-04-18 LAB — CBC
HCT: 27 % — ABNORMAL LOW (ref 36.0–46.0)
Hemoglobin: 8.6 g/dL — ABNORMAL LOW (ref 12.0–15.0)
MCH: 27.2 pg (ref 26.0–34.0)
MCHC: 31.9 g/dL (ref 30.0–36.0)
MCV: 85.4 fL (ref 80.0–100.0)
Platelets: 510 10*3/uL — ABNORMAL HIGH (ref 150–400)
RBC: 3.16 MIL/uL — ABNORMAL LOW (ref 3.87–5.11)
RDW: 14.4 % (ref 11.5–15.5)
WBC: 11 10*3/uL — ABNORMAL HIGH (ref 4.0–10.5)
nRBC: 0 % (ref 0.0–0.2)

## 2019-04-18 LAB — COMPREHENSIVE METABOLIC PANEL
ALT: 57 U/L — ABNORMAL HIGH (ref 0–44)
AST: 64 U/L — ABNORMAL HIGH (ref 15–41)
Albumin: 2.1 g/dL — ABNORMAL LOW (ref 3.5–5.0)
Alkaline Phosphatase: 45 U/L (ref 38–126)
Anion gap: 9 (ref 5–15)
BUN: 41 mg/dL — ABNORMAL HIGH (ref 6–20)
CO2: 23 mmol/L (ref 22–32)
Calcium: 8.3 mg/dL — ABNORMAL LOW (ref 8.9–10.3)
Chloride: 110 mmol/L (ref 98–111)
Creatinine, Ser: 2.11 mg/dL — ABNORMAL HIGH (ref 0.44–1.00)
GFR calc Af Amer: 36 mL/min — ABNORMAL LOW (ref >60)
GFR calc non Af Amer: 31 mL/min — ABNORMAL LOW (ref >60)
Glucose, Bld: 125 mg/dL — ABNORMAL HIGH (ref 70–99)
Potassium: 3.3 mmol/L — ABNORMAL LOW (ref 3.5–5.1)
Sodium: 142 mmol/L (ref 135–145)
Total Bilirubin: 0.5 mg/dL (ref 0.3–1.2)
Total Protein: 5.3 g/dL — ABNORMAL LOW (ref 6.5–8.1)

## 2019-04-18 LAB — BASIC METABOLIC PANEL
Anion gap: 9 (ref 5–15)
BUN: 35 mg/dL — ABNORMAL HIGH (ref 6–20)
CO2: 17 mmol/L — ABNORMAL LOW (ref 22–32)
Calcium: 8.1 mg/dL — ABNORMAL LOW (ref 8.9–10.3)
Chloride: 111 mmol/L (ref 98–111)
Creatinine, Ser: 1.73 mg/dL — ABNORMAL HIGH (ref 0.44–1.00)
GFR calc Af Amer: 46 mL/min — ABNORMAL LOW (ref >60)
GFR calc non Af Amer: 39 mL/min — ABNORMAL LOW (ref >60)
Glucose, Bld: 103 mg/dL — ABNORMAL HIGH (ref 70–99)
Potassium: 4.3 mmol/L (ref 3.5–5.1)
Sodium: 137 mmol/L (ref 135–145)

## 2019-04-18 MED ORDER — POTASSIUM CHLORIDE 10 MEQ/100ML IV SOLN
10.0000 meq | INTRAVENOUS | Status: AC
  Administered 2019-04-18 (×4): 10 meq via INTRAVENOUS
  Filled 2019-04-18 (×4): qty 100

## 2019-04-18 MED ORDER — BISACODYL 10 MG RE SUPP
10.0000 mg | Freq: Once | RECTAL | Status: AC
  Administered 2019-04-18: 10 mg via RECTAL
  Filled 2019-04-18: qty 1

## 2019-04-18 MED ORDER — LORAZEPAM 2 MG/ML IJ SOLN
0.5000 mg | Freq: Four times a day (QID) | INTRAMUSCULAR | Status: DC | PRN
  Administered 2019-04-19: 0.5 mg via INTRAVENOUS
  Filled 2019-04-18: qty 1

## 2019-04-18 NOTE — Progress Notes (Signed)
Physicians at bedside with patient. RN present in room for exam/discussion.

## 2019-04-18 NOTE — Progress Notes (Addendum)
Patient noticed walking the hallway with her mother, tachycardic at 135. Pt denies symptoms of weakness at this time.   Pt encouraged to walk near her room, but repeat-trips due to elevated HR and potential onset of sx.   Pt agreeable to this plan.     Vital Signs MEWS/VS Documentation      04/18/2019 0503 04/18/2019 0700 04/18/2019 0719 04/18/2019 1124   MEWS Score:  1  2  1  2    MEWS Score Color:  Green  Yellow  Green  Yellow   Resp:  16  -  18  16   Pulse:  (!) 106  -  100  (!) 128   BP:  (!) 128/93  -  (!) 139/93  (!) 133/91   Temp:  98.2 F (36.8 C)  -  (!) 100.6 F (38.1 C)  100 F (37.8 C)   O2 Device:  Room [employer]  -  Room [employer]           [place] N Tobias[place]Diakun 04/18/2019,12:43 PM

## 2019-04-18 NOTE — Progress Notes (Addendum)
Patient provided warm water to drink, per request. Promptly followed by a new order for NPO due to nausea (per note), pt denies nausea. Page to physician to verify order/based on symptom pt is denying.    Callback received, verified with patient while MD was on the phone that she has been without nausea today, and reports some overall improvement in her symptoms.

## 2019-04-18 NOTE — Progress Notes (Signed)
On-call MD is not recommending any intervention for the elevated HR at this time as long as the patient is asymptomatic

## 2019-04-18 NOTE — Progress Notes (Signed)
Subjective: Patient was seen this morning on rounds. Patient states that her abdomen abdominal pain has progressed slightly over the last 24 hours.  She states that she is having increased indigestion with significant reflux.  Patient only had a small bowel movement this morning.  Patient has had mild nausea without any vomiting.  She states that her abdominal pain has been slightly worse in the upper quadrants today which is different from previous abdominal pain which was bilateral lower quadrants.  Objective:  Vital signs in last 24 hours: Vitals:   04/17/19 1122 04/17/19 1625 04/17/19 2106 04/18/19 0503  BP: (!) 137/91 (!) 138/91 (!) 137/93 (!) 128/93  Pulse: (!) 128 (!) 125 (!) 124 (!) 106  Resp: 17 18 18 16   Temp: 99.2 F (37.3 C) 99.5 F (37.5 C) 99.5 F (37.5 C) 98.2 F (36.8 C)  TempSrc: Oral Oral Oral Oral  SpO2: 100% 94% 95% 98%  Weight:      Height:        Physical Exam: Physical Exam  Constitutional: She is oriented to person, place, and time. No distress.  HENT:  Head: Atraumatic.  Eyes: EOM are normal.  Neck: Normal range of motion.  Cardiovascular: Normal rate, regular rhythm, normal heart sounds and intact distal pulses. Exam reveals no gallop and no friction rub.  No murmur heard. Pulmonary/Chest: Effort normal and breath sounds normal. No respiratory distress. She exhibits no tenderness.  Abdominal: Soft. She exhibits distension. Bowel sounds are hypoactive. There is abdominal tenderness.  Musculoskeletal: Normal range of motion.        General: No tenderness or edema.  Neurological: She is alert and oriented to person, place, and time.  Skin: Skin is warm and dry. Rash noted. She is not diaphoretic.    Pertinent labs/Imaging: CBC Latest Ref Rng & Units 04/17/2019 04/16/2019 04/15/2019  WBC 4.0 - 10.5 K/uL 8.5 9.0 8.7  Hemoglobin 12.0 - 15.0 g/dL 8.0(L) 7.9(L) 8.0(L)  Hematocrit 36.0 - 46.0 % 24.7(L) 24.3(L) 23.8(L)  Platelets 150 - 400 K/uL 455(H)  379 356   CMP Latest Ref Rng & Units 04/17/2019 04/16/2019 04/15/2019  Glucose 70 - 99 mg/dL 04/17/2019) 417(E) 98  BUN 6 - 20 mg/dL 081(K) 48(J) 85(U)  Creatinine 0.44 - 1.00 mg/dL 31(S) 9.70(Y) 6.37(C)  Sodium 135 - 145 mmol/L 145 142 136  Potassium 3.5 - 5.1 mmol/L 3.7 4.3 4.2  Chloride 98 - 111 mmol/L 112(H) 110 102  CO2 22 - 32 mmol/L 20(L) 20(L) 22  Calcium 8.9 - 10.3 mg/dL 5.88(F) 0.2(D) 7.4(J)  Total Protein 6.5 - 8.1 g/dL - 5.6(L) 5.4(L)  Total Bilirubin 0.3 - 1.2 mg/dL - 1.0 0.9  Alkaline Phos 38 - 126 U/L - 43 38  AST 15 - 41 U/L - 53(H) 33  ALT 0 - 44 U/L - 34 20    Assessment/Plan:  Principal Problem:   SBO (small bowel obstruction) (HCC) Active Problems:   Hypothyroidism (acquired)   Amenorrhea   Acute renal injury (HCC)   Elevated TSH   Elevated serum free T4 level   Hyponatremia   Leukocytosis   Anemia   Acute cystitis with hematuria   Patient Summary: 28 year old female with a pPMHx of 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  Clinical course complicated by UTI, AKI, upper GI bleed.  Patient's improvement has plateaued at this time.  She continues to have abdominal  pain with significant reflux symptoms.  She admits to some mild nausea without vomiting.  She did have 1 small bowel movement today.  #SBO #Enteritis  Patient continues to have abdominal discomfort.  Considering she has an AKI as well as an obstruction making NSAIDs and opioid medications difficult to use at this time.  Will prescribe her Ativan to help with her discomfort.  If she has any vomiting we will need to 3 insert the NG tube and decreased p.o. intake in the setting of nausea. Patient that she had mild nausea this morning without vomitus. Patient's nurse called stating that she denies nausea and may not have actually been that nauseated  this morning during evaluation. I will continue her clear liquid diet at this time.  - Clear liquid diet -Reassess for need for NG tube placement -Surgery would like to try suppositories today to try to help mobilize the GI tract. - Continue levofloxacin for possible enteritis. - Encourage ambulation   #AKI #UTI AKI is improving with fluids.  Creatinine is 2.11 with a GFR of 31 patient is however hypokalemic at 3.3.  -We will replete potassium today. -We will continue to give IV fluids at this time. -We will continue levofloxacin for UTI.  Patient is on day 4 of antibiotics  #GI Bleed: #Iron deficiency anemia: - resolved at this time. - She will need out patient follow up with GI and Iron supplementation.   #Amenorrhea: She states that she is currently started her period at this time.  #Hypokalemia: Patient's potassium was 3.3. I will give her 4 runs of 10 mEqs KCl. Follow up BMP tonight.   Diet: Clear Liquid IVF: 125 cc/hr VTE: Heparin Code: Full Code   Dispo: Anticipated discharge pending clinical improvement.   Marianna Payment, MD 04/18/2019, 5:45 AM Pager: [phone]

## 2019-04-18 NOTE — Progress Notes (Signed)
Notified the on-call MD about the elevated HR, she reports that it is chronic condition for the pt, and as long as pt is asymptomatic to it, she not recommend any intervention at this time but will let the day shift team look at it also. Will continue monitoring patient

## 2019-04-18 NOTE — Progress Notes (Signed)
Pt alert and oriented X4. Heart rate fluctuating between 114 to 120 at this time. Pt denies any symptoms such as chest pain/discomfort, dizziness, reports that she is doing well over all. Had small bowel movement at shift change. No distress noted, will monitor pt closely.

## 2019-04-18 NOTE — Progress Notes (Signed)
Not an acute change in status.

## 2019-04-18 NOTE — Progress Notes (Signed)
[facility] Progress Note     Subjective: CC: abdominal pain Patient with more pain in LUQ today. Abdomen feels more bloated. Only small BMs since yesterday, passing less flatus. No emesis but having reflux.   Objective: Vital signs in last 24 hours: Temp:  [98.2 F (36.8 C)-100.6 F (38.1 C)] 100.6 F (38.1 C) (10/25 0719) Pulse Rate:  [100-128] 100 (10/25 0719) Resp:  [16-18] 18 (10/25 0719) BP: (128-139)/(91-93) 139/93 (10/25 0719) SpO2:  [94 %-100 %] 95 % (10/25 0719) Last BM Date: 04/17/19  Intake/Output from previous day: 10/24 0701 - 10/25 0700 In: 4075.4 [P.O.:949; I.V.:2976.4; IV Piggyback:150] Out: 1660 [Urine:1300; Emesis/NG output:360] Intake/Output this shift: Total I/O In: 402 [P.O.:402] Out: -   PE: Gen:  Alert, NAD, pleasant Card:  Regular rate and rhythm, pedal pulses 2+ BL Pulm:  Normal effort, clear to auscultation bilaterally Abd: Soft, mildly TTP LUQ, distended, BS hypoactive Skin: warm and dry, no rashes  Psych: A&Ox3   Lab Results:  Recent Labs    04/17/19 0457 04/18/19 0415  WBC 8.5 11.0*  HGB 8.0* 8.6*  HCT 24.7* 27.0*  PLT 455* 510*   BMET Recent Labs    04/17/19 0457 04/18/19 0415  NA 145 142  K 3.7 3.3*  CL 112* 110  CO2 20* 23  GLUCOSE 109* 125*  BUN 48* 41*  CREATININE 2.22* 2.11*  CALCIUM 8.6* 8.3*   PT/INR No results for input(s): LABPROT, INR in the last 72 hours. CMP     Component Value Date/Time   NA 142 04/18/2019 0415   NA 127 (L) 12/22/2017 1449   K 3.3 (L) 04/18/2019 0415   CL 110 04/18/2019 0415   CO2 23 04/18/2019 0415   GLUCOSE 125 (H) 04/18/2019 0415   BUN 41 (H) 04/18/2019 0415   BUN 9 12/22/2017 1449   CREATININE 2.11 (H) 04/18/2019 0415   CALCIUM 8.3 (L) 04/18/2019 0415   PROT 5.3 (L) 04/18/2019 0415   PROT 6.8 12/22/2017 1449   ALBUMIN 2.1 (L) 04/18/2019 0415   ALBUMIN 5.0 12/22/2017 1449   AST 64 (H) 04/18/2019 0415   ALT 57 (H) 04/18/2019 0415   ALKPHOS 45 04/18/2019 0415   BILITOT 0.5 04/18/2019 0415   BILITOT 0.2 12/22/2017 1449   GFRNONAA 31 (L) 04/18/2019 0415   GFRAA 36 (L) 04/18/2019 0415   Lipase     Component Value Date/Time   LIPASE 18 04/14/2019 0846       Studies/Results: Dg Abd Portable 1v-small Bowel Obstruction Protocol-initial, 8 Hr Delay  Result Date: 04/16/2019 CLINICAL DATA:  Small bowel obstruction EXAM: PORTABLE ABDOMEN - 1 VIEW COMPARISON:  April 14, 2019 FINDINGS: There is a single loop of borderline dilated small bowel in the right abdomen. There is somewhat of a paucity of bowel gas overall. No air-fluid levels evident. No free air evident. IMPRESSION: Relative paucity of bowel gas. There is air in a loop of bowel in the right mid abdomen which is slightly dilated. The appearance is more suggestive of ileus or enteritis than bowel obstruction, although a degree of bowel obstruction cannot excluded in this circumstance. No evident free air. Electronically Signed   By: Lowella Grip III M.D.   On: 04/16/2019 19:30    Anti-infectives: Anti-infectives (From admission, onward)   Start     Dose/Rate Route Frequency Ordered Stop   04/15/19 1200  levofloxacin (LEVAQUIN) IVPB 750 mg  Status:  Discontinued     750 mg 100 mL/hr over 90 Minutes Intravenous Every 24  hours 04/15/19 1127 04/15/19 1149   04/15/19 1200  levofloxacin (LEVAQUIN) IVPB 750 mg     750 mg 100 mL/hr over 90 Minutes Intravenous Every 48 hours 04/15/19 1149     04/14/19 1100  cefTRIAXone (ROCEPHIN) 1 g in sodium chloride 0.9 % 100 mL IVPB  Status:  Discontinued     1 g 200 mL/hr over 30 Minutes Intravenous Every 24 hours 04/14/19 1055 04/15/19 1127       Assessment/Plan SIRS ABL anemia - hgb 8.6, stable ARF- likely in the setting of volume loss, nephrology on board, Cr improving Ileus vs SBO on CT - unsure of etiology of symptoms but possible this is enteritis compounded by severe dehydration/ ARF -patient more distended and less bowel function, check  abdominal film, if vomiting or marked distention on film will need NGT replaced - can try suppository today -mobilize   FEN:CLD, IVF VTE: SCD's, lovenoxper medicine TW:[id number] 10/21>>10/22, Levaquin 10/22>> Foley:none Follow up:TBD  LOS: 4 days    Wells Guiles , [facility] 04/18/2019, 8:35 AM Please see Amion for pager number during day hours 7:00am-4:30pm

## 2019-04-18 NOTE — Progress Notes (Signed)
   Vital Signs MEWS/VS Documentation       04/18/2019 0700 04/18/2019 0719 04/18/2019 1000 04/18/2019 1124   MEWS Score:  2  1  1  2    MEWS Score Color:  Yellow  Green  Green  Yellow   Resp:  --  18  --  16   Pulse:  --  100  --  (!) 128   BP:  --  (!) 139/93  --  (!) 133/91   Temp:  --  (!) 100.6 F (38.1 C)  --  100 F (37.8 C)   O2 Device:  --  Room Air  --  Room Air   Level of Consciousness:  --  --  Alert  --            Kim Myers 04/18/2019,3:16 PM

## 2019-04-19 ENCOUNTER — Inpatient Hospital Stay (HOSPITAL_COMMUNITY): Payer: BC Managed Care – PPO

## 2019-04-19 DIAGNOSIS — R188 Other ascites: Secondary | ICD-10-CM

## 2019-04-19 DIAGNOSIS — E876 Hypokalemia: Secondary | ICD-10-CM

## 2019-04-19 DIAGNOSIS — K922 Gastrointestinal hemorrhage, unspecified: Secondary | ICD-10-CM

## 2019-04-19 DIAGNOSIS — D509 Iron deficiency anemia, unspecified: Secondary | ICD-10-CM

## 2019-04-19 DIAGNOSIS — R7989 Other specified abnormal findings of blood chemistry: Secondary | ICD-10-CM

## 2019-04-19 LAB — CULTURE, BLOOD (ROUTINE X 2)
Culture: NO GROWTH
Culture: NO GROWTH
Special Requests: ADEQUATE

## 2019-04-19 LAB — BASIC METABOLIC PANEL
Anion gap: 9 (ref 5–15)
BUN: 34 mg/dL — ABNORMAL HIGH (ref 6–20)
CO2: 20 mmol/L — ABNORMAL LOW (ref 22–32)
Calcium: 8.5 mg/dL — ABNORMAL LOW (ref 8.9–10.3)
Chloride: 112 mmol/L — ABNORMAL HIGH (ref 98–111)
Creatinine, Ser: 1.9 mg/dL — ABNORMAL HIGH (ref 0.44–1.00)
GFR calc Af Amer: 41 mL/min — ABNORMAL LOW (ref >60)
GFR calc non Af Amer: 35 mL/min — ABNORMAL LOW (ref >60)
Glucose, Bld: 114 mg/dL — ABNORMAL HIGH (ref 70–99)
Potassium: 4.3 mmol/L (ref 3.5–5.1)
Sodium: 141 mmol/L (ref 135–145)

## 2019-04-19 LAB — HEPATITIS PANEL, ACUTE
HCV Ab: NONREACTIVE
Hep A IgM: NONREACTIVE
Hep B C IgM: NONREACTIVE
Hepatitis B Surface Ag: NONREACTIVE

## 2019-04-19 LAB — CBC
HCT: 25.1 % — ABNORMAL LOW (ref 36.0–46.0)
Hemoglobin: 8 g/dL — ABNORMAL LOW (ref 12.0–15.0)
MCH: 27.4 pg (ref 26.0–34.0)
MCHC: 31.9 g/dL (ref 30.0–36.0)
MCV: 86 fL (ref 80.0–100.0)
Platelets: 550 10*3/uL — ABNORMAL HIGH (ref 150–400)
RBC: 2.92 MIL/uL — ABNORMAL LOW (ref 3.87–5.11)
RDW: 14.7 % (ref 11.5–15.5)
WBC: 20.3 10*3/uL — ABNORMAL HIGH (ref 4.0–10.5)
nRBC: 0.1 % (ref 0.0–0.2)

## 2019-04-19 LAB — GLUCOSE, CAPILLARY: Glucose-Capillary: 89 mg/dL (ref 70–99)

## 2019-04-19 MED ORDER — SODIUM CHLORIDE 0.9 % IV SOLN
INTRAVENOUS | Status: DC
  Administered 2019-04-19 – 2019-04-20 (×2): via INTRAVENOUS

## 2019-04-19 MED ORDER — HYDROMORPHONE HCL 1 MG/ML IJ SOLN
0.5000 mg | INTRAMUSCULAR | Status: DC | PRN
  Administered 2019-04-19 – 2019-04-21 (×7): 0.5 mg via INTRAVENOUS
  Filled 2019-04-19: qty 0.5
  Filled 2019-04-19 (×2): qty 1
  Filled 2019-04-19: qty 0.5
  Filled 2019-04-19: qty 1
  Filled 2019-04-19 (×2): qty 0.5

## 2019-04-19 MED ORDER — SODIUM CHLORIDE 0.9 % IV BOLUS
1000.0000 mL | Freq: Once | INTRAVENOUS | Status: AC
  Administered 2019-04-19: 1000 mL via INTRAVENOUS

## 2019-04-19 MED ORDER — PIPERACILLIN-TAZOBACTAM 3.375 G IVPB
3.3750 g | Freq: Three times a day (TID) | INTRAVENOUS | Status: DC
  Administered 2019-04-19 – 2019-04-30 (×33): 3.375 g via INTRAVENOUS
  Filled 2019-04-19 (×32): qty 50

## 2019-04-19 NOTE — Progress Notes (Signed)
Patient ID: Kim Myers, female   DOB: 1991-04-07, 28 y.o.   MRN: [id number]       Subjective: Patient clinically feels much better today.  Having semi-solid stools.  Tolerating clear liquids with no nausea.  Still very bloated.  Wanting to go home today.  Pain is much better  Objective: Vital signs in last 24 hours: Temp:  [98.5 F (36.9 C)-101.3 F (38.5 C)] 98.5 F (36.9 C) (10/26 0815) Pulse Rate:  [111-128] 125 (10/26 0815) Resp:  [15-18] 18 (10/26 0815) BP: (129-140)/(88-94) 131/88 (10/26 0815) SpO2:  [85 %-100 %] 98 % (10/26 0815) Weight:  [69.2 kg] 69.2 kg (10/26 0429) Last BM Date: 04/19/19  Intake/Output from previous day: 10/25 0701 - 10/26 0700 In: 5315.9 [P.O.:2197; I.V.:2902.5; IV Piggyback:216.4] Out: 4196 [QIWLN:9892; Stool:15] Intake/Output this shift: Total I/O In: 240 [P.O.:240] Out: 200 [Urine:200]  PE: Heart: regular, but tachy in 120s Lungs: CTAB Abd: distended, tympanitic, some BS, not really tender  Lab Results:  Recent Labs    04/18/19 0415 04/19/19 0547  WBC 11.0* 20.3*  HGB 8.6* 8.0*  HCT 27.0* 25.1*  PLT 510* 550*   BMET Recent Labs    04/18/19 2238 04/19/19 0547  NA 137 141  K 4.3 4.3  CL 111 112*  CO2 17* 20*  GLUCOSE 103* 114*  BUN 35* 34*  CREATININE 1.73* 1.90*  CALCIUM 8.1* 8.5*   PT/INR No results for input(s): LABPROT, INR in the last 72 hours. CMP     Component Value Date/Time   NA 141 04/19/2019 0547   NA 127 (L) 12/22/2017 1449   K 4.3 04/19/2019 0547   CL 112 (H) 04/19/2019 0547   CO2 20 (L) 04/19/2019 0547   GLUCOSE 114 (H) 04/19/2019 0547   BUN 34 (H) 04/19/2019 0547   BUN 9 12/22/2017 1449   CREATININE 1.90 (H) 04/19/2019 0547   CALCIUM 8.5 (L) 04/19/2019 0547   PROT 5.3 (L) 04/18/2019 0415   PROT 6.8 12/22/2017 1449   ALBUMIN 2.1 (L) 04/18/2019 0415   ALBUMIN 5.0 12/22/2017 1449   AST 64 (H) 04/18/2019 0415   ALT 57 (H) 04/18/2019 0415   ALKPHOS 45 04/18/2019 0415   BILITOT 0.5 04/18/2019 0415    BILITOT 0.2 12/22/2017 1449   GFRNONAA 35 (L) 04/19/2019 0547   GFRAA 41 (L) 04/19/2019 0547   Lipase     Component Value Date/Time   LIPASE 18 04/14/2019 0846       Studies/Results: Dg Abd Portable 1v  Result Date: 04/18/2019 CLINICAL DATA:  Abdominal pain EXAM: PORTABLE ABDOMEN - 1 VIEW COMPARISON:  Abdominal radiograph dated 04/16/2019 FINDINGS: The bowel gas pattern is normal. Air-fluid levels cannot be assessed on the supine radiograph. No radio-opaque calculi or other significant radiographic abnormality are seen. IMPRESSION: Nonobstructive bowel gas pattern. Electronically Signed   By: Zerita Boers M.D.   On: 04/18/2019 16:15    Anti-infectives: Anti-infectives (From admission, onward)   Start     Dose/Rate Route Frequency Ordered Stop   04/15/19 1200  levofloxacin (LEVAQUIN) IVPB 750 mg  Status:  Discontinued     750 mg 100 mL/hr over 90 Minutes Intravenous Every 24 hours 04/15/19 1127 04/15/19 1149   04/15/19 1200  levofloxacin (LEVAQUIN) IVPB 750 mg     750 mg 100 mL/hr over 90 Minutes Intravenous Every 48 hours 04/15/19 1149     04/14/19 1100  cefTRIAXone (ROCEPHIN) 1 g in sodium chloride 0.9 % 100 mL IVPB  Status:  Discontinued  1 g 200 mL/hr over 30 Minutes Intravenous Every 24 hours 04/14/19 1055 04/15/19 1127       Assessment/Plan SIRS ABL anemia - hgb 8.0 ARF- up from 1.73 to 1.9 today Ileus vs SBO on CT - unsure of etiology of symptoms but possible this is enteritis compounded by severe dehydration/ ARF -patient still with distention despite having bowel function.  Film yesterday was normal.  Unclear why she is so distended still. -WBC back up to 20K today and still tachy in 120s -tolerating clear liquids with less pain than before. -still on abx therapy   FEN:CLD, IVF VTE: SCD's, lovenoxper medicine JS:[id number] 10/21>>10/22, Levaquin 10/22>> Foley:none Follow up:TBD   LOS: 5 days    Letha Cape , [facility]  Surgery 04/19/2019, 8:54 AM Please see Amion for pager number during day hours 7:00am-4:30pm

## 2019-04-19 NOTE — Progress Notes (Signed)
Discussed CT results with patient and her mother.  She still has significant small bowel dilatation c/w SBO despite moving her bowels.  She had a temp overnight of 101.3, increase in WBC today to 20K, and overall failure to improve during her stay.  We would like to replace her NGT which she is agreeable to and plan for OR tomorrow for exploratory laparotomy.  The patient agrees with this plan as does her mom who is at the bedside.  We will plan for tomorrow morning.  Henreitta Cea 3:15 PM 04/19/2019

## 2019-04-19 NOTE — Progress Notes (Signed)
Pharmacy Antibiotic Note  Kim Myers is a 28 y.o. female admitted on 04/14/2019 with UTI vs abdominal infection. Pt was started on levofloxacin with worsening leukocytosis now and fevers overnight. Pharmacy has been consulted for Zosyn dosing with concern for possible abscess, CT pending.  Plan: Zosyn 3.375g IV EI q8h Monitor LOT, CT results  Height: 5\' 6"  (167.6 cm) Weight: 152 lb 8 oz (69.2 kg)(scale c) IBW/kg (Calculated) : 59.3  Temp (24hrs), Avg:99.8 F (37.7 C), Min:98.5 F (36.9 C), Max:101.3 F (38.5 C)  Recent Labs  Lab 04/14/19 0944  04/15/19 0822 04/15/19 1005 04/15/19 2212 04/16/19 0653 04/17/19 0457 04/18/19 0415 04/18/19 2238 04/19/19 0547  WBC  --    < >  --   --  8.7 9.0 8.5 11.0*  --  20.3*  CREATININE  --    < > 2.94*  --   --  2.50* 2.22* 2.11* 1.73* 1.90*  LATICACIDVEN 1.8  --  0.9 0.8  --   --   --   --   --   --    < > = values in this interval not displayed.    Estimated Creatinine Clearance: 41.3 mL/min (A) (by C-G formula based on SCr of 1.9 mg/dL (H)).    Allergies  Allergen Reactions  . Gluten Meal Diarrhea, Nausea And Vomiting and Other (See Comments)    Caused internal bleeding that resulted in hospitalization!!  . Cauliflower [Brassica Oleracea Italica]   . Food     Red Grape Apple   . Peanut-Containing Drug Products     Antimicrobials this admission: Ceftriaxone x1 10/21 Levofloxacin 10/22 >> 10/25 Zosyn 10/26 >>  Microbiology results: 10/26 GI panel: pending 10/21 BCx: negative 10/21 UCx: insig growth  Thank you for allowing pharmacy to be a part of this patient's care.   Arrie Senate, PharmD, BCPS Clinical Pharmacist [phone] Please check AMION for all [facility] numbers 04/19/2019

## 2019-04-19 NOTE — Progress Notes (Signed)
Bladder scanned pt for 426 ml, but pt is requesting to urinate on her own. Will monitor closely

## 2019-04-19 NOTE — Progress Notes (Signed)
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  Patient signs and symptoms concerning for small bowel obstruction versus ileus due to enteritis.  Patient's clinical course was complicated by bladder outlet obstruction with AKI and UTI, and presented with an upper GI bleed.    Patient's that she feels like she is improving.  She denies any nausea or vomiting at this time and is tolerating her liquid diet.  Patient states that she continues to feel bloated but is no longer having reflux or nausea.  Patient states that she had 4 small bowel movements this morning with a mix of solid and liquid stool.   Overnight: She did spike a fever up to 101.33F  overnight and was given tylenol and the fever came down to 99. Has not required any ativan. No nausea or vomiting.   Objective:  Vital signs in last 24 hours: Vitals:   04/18/19 2337 04/19/19 0107 04/19/19 0230 04/19/19 0429  BP:  129/90  (!) 133/94  Pulse: (!) 125 (!) 118  (!) 111  Resp:  18  18  Temp: 99 F (37.2 C) (!) 101.3 F (38.5 C) 100.2 F (37.9 C) 99.4 F (37.4 C)  TempSrc: Oral Oral Oral Oral  SpO2: 93% 97%  95%  Weight:    69.2 kg  Height:        Physical Exam: Physical Exam  Constitutional: She is oriented to person, place, and time. No distress.  HENT:  Head: Atraumatic.  Eyes: EOM are normal.  Cardiovascular: Intact distal pulses. Exam reveals no gallop and no friction rub.  No murmur heard. Patient remains tachycardic at this time.  Pulmonary/Chest: Effort normal and breath sounds normal. No respiratory distress. She exhibits no tenderness.  Abdominal: She exhibits distension. Bowel sounds are hypoactive. There is abdominal tenderness.  Musculoskeletal: Normal range  of motion.        General: No tenderness or edema.  Neurological: She is alert and oriented to person, place, and time.  Skin: Skin is warm and dry. She is not diaphoretic.    Pertinent labs/Imaging: CBC Latest Ref Rng & Units 04/18/2019 04/17/2019 04/16/2019  WBC 4.0 - 10.5 K/uL 11.0(H) 8.5 9.0  Hemoglobin 12.0 - 15.0 g/dL 7.0(V) 7.7(L) 7.9(L)  Hematocrit 36.0 - 46.0 % 27.0(L) 24.7(L) 24.3(L)  Platelets 150 - 400 K/uL 510(H) 455(H) 379   CMP Latest Ref Rng & Units 04/18/2019 04/18/2019 04/17/2019  Glucose 70 - 99 mg/dL 390(Z) 009(Q) 330(Q)  BUN 6 - 20 mg/dL 76(A) 26(J) 33(L)  Creatinine 0.44 - 1.00 mg/dL 4.56(Y) 5.63(S) 9.37(D)  Sodium 135 - 145 mmol/L 137 142 145  Potassium 3.5 - 5.1 mmol/L 4.3 3.3(L) 3.7  Chloride 98 - 111 mmol/L 111 110 112(H)  CO2 22 - 32 mmol/L 17(L) 23 20(L)  Calcium 8.9 - 10.3 mg/dL 8.1(L) 8.3(L) 8.6(L)  Total Protein 6.5 - 8.1 g/dL - 5.3(L) -  Total Bilirubin 0.3 - 1.2 mg/dL - 0.5 -  Alkaline Phos 38 - 126 U/L - 45 -  AST 15 - 41 U/L - 64(H) -  ALT 0 - 44 U/L - 57(H) -   CXR - Nonobstructive bowel gas pattern. CT abdomen (oral contrast): pending   Assessment/Plan:  Principal Problem:   SBO (small bowel obstruction) (HCC) Active Problems:   Hypothyroidism (acquired)  Amenorrhea   Acute renal injury (HCC)   Elevated TSH   Elevated serum free T4 level   Hyponatremia   Leukocytosis   Anemia   Acute cystitis with hematuria    Patient Summary: 28 year old female with a pPMHx of anorexia, amenorrhea (hypothalamic hypogonadism), Hx of Meckel's diverticulum (s/p small bowel resection) who presented with signs symptoms concerning for SBO versus listed enteritis.  Clinical course complicated by UTI, AKI, upper GI bleed.    On evaluation today the patient admits to feeling better than yesterday and states that she would like to go home to finish recovering.  Patient is still very distended with progression of her leukocytosis to 22 on levofloxacin.   Patient remains tachycardic and febrile with a fever overnight up to 101.3. This is concerning for possible worsening of her obstruction versus Donnell abscess versus acute inflammatory process.  #SBO #Enteritis  - Continue IVF NS  - Replete electrolyte as needed  - WBC count was elevated yesterday.  Today she has a leukocytosis of 22. - NG tube removed. May need to be replaced if vomiting. - Deescalate PO intact if nauseous   -We will do a repeat CT scan with oral contrast today. -Patient is on levofloxacin for possible enteritis.  We will broaden her antibiotic coverage to Zosyn at this time. D/c levofloxacin. -We will do a GI panel at this time for infectious etiologies.  #AKI - Cr 1.9 today - GFR 35  #UTI - On day# 5 for levofloxacin likely solved at this time.  #GI Bleed #Iron Deficiency Anemia -Resolved at this time patient will need GI follow-up as an outpatient.  #Ascities: -Patient's original CT scan showed small to moderate ascites with elevated AST and ALT of 64 and 57 respectively -We will perform a hepatitis panel at this time. - Hold of on paracentesis until repeat imaging.  #Hypokalemia: - K: 4.3  Diet: Clear IVF: NS increased to 125 cc/hr VTE: continue SCDs Code: Full  Dispo: Anticipated discharge pending clinical improvement.   Marianna Payment, MD 04/19/2019, 5:26 AM Pager: [phone]

## 2019-04-20 ENCOUNTER — Inpatient Hospital Stay (HOSPITAL_COMMUNITY): Payer: BC Managed Care – PPO | Admitting: Anesthesiology

## 2019-04-20 ENCOUNTER — Inpatient Hospital Stay: Payer: Self-pay

## 2019-04-20 ENCOUNTER — Inpatient Hospital Stay (HOSPITAL_COMMUNITY): Payer: BC Managed Care – PPO

## 2019-04-20 ENCOUNTER — Encounter (HOSPITAL_COMMUNITY)
Admission: EM | Disposition: A | Discharge status: Home / Self Care | Payer: Self-pay | Source: Home / Self Care | Attending: Internal Medicine

## 2019-04-20 ENCOUNTER — Encounter (HOSPITAL_COMMUNITY): Payer: Self-pay | Admitting: Surgery

## 2019-04-20 DIAGNOSIS — J95821 Acute postprocedural respiratory failure: Secondary | ICD-10-CM | POA: Diagnosis not present

## 2019-04-20 DIAGNOSIS — N179 Acute kidney failure, unspecified: Secondary | ICD-10-CM

## 2019-04-20 HISTORY — PX: LAPAROTOMY: SHX154

## 2019-04-20 HISTORY — PX: ILEOCECETOMY: SHX5857

## 2019-04-20 HISTORY — PX: LYSIS OF ADHESION: SHX5961

## 2019-04-20 LAB — POCT I-STAT 7, (LYTES, BLD GAS, ICA,H+H)
Acid-base deficit: 7 mmol/L — ABNORMAL HIGH (ref 0.0–2.0)
Acid-base deficit: 9 mmol/L — ABNORMAL HIGH (ref 0.0–2.0)
Bicarbonate: 18.6 mmol/L — ABNORMAL LOW (ref 20.0–28.0)
Bicarbonate: 16.9 mmol/L — ABNORMAL LOW (ref 20.0–28.0)
Calcium, Ion: 1.17 mmol/L (ref 1.15–1.40)
Calcium, Ion: 1.04 mmol/L — ABNORMAL LOW (ref 1.15–1.40)
HCT: 21 % — ABNORMAL LOW (ref 36.0–46.0)
HCT: 31 % — ABNORMAL LOW (ref 36.0–46.0)
Hemoglobin: 7.1 g/dL — ABNORMAL LOW (ref 12.0–15.0)
Hemoglobin: 10.5 g/dL — ABNORMAL LOW (ref 12.0–15.0)
O2 Saturation: 99 %
O2 Saturation: 99 %
Patient temperature: 98.7
Potassium: 3.9 mmol/L (ref 3.5–5.1)
Potassium: 4.1 mmol/L (ref 3.5–5.1)
Sodium: 139 mmol/L (ref 135–145)
Sodium: 139 mmol/L (ref 135–145)
TCO2: 20 mmol/L — ABNORMAL LOW (ref 22–32)
TCO2: 18 mmol/L — ABNORMAL LOW (ref 22–32)
pCO2 arterial: 37.3 mmHg (ref 32.0–48.0)
pCO2 arterial: 34.4 mmHg (ref 32.0–48.0)
pH, Arterial: 7.306 — ABNORMAL LOW (ref 7.350–7.450)
pH, Arterial: 7.3 — ABNORMAL LOW (ref 7.350–7.450)
pO2, Arterial: 138 mmHg — ABNORMAL HIGH (ref 83.0–108.0)
pO2, Arterial: 148 mmHg — ABNORMAL HIGH (ref 83.0–108.0)

## 2019-04-20 LAB — BASIC METABOLIC PANEL
Anion gap: 9 (ref 5–15)
BUN: 30 mg/dL — ABNORMAL HIGH (ref 6–20)
CO2: 18 mmol/L — ABNORMAL LOW (ref 22–32)
Calcium: 7.9 mg/dL — ABNORMAL LOW (ref 8.9–10.3)
Chloride: 110 mmol/L (ref 98–111)
Creatinine, Ser: 1.88 mg/dL — ABNORMAL HIGH (ref 0.44–1.00)
GFR calc Af Amer: 41 mL/min — ABNORMAL LOW (ref >60)
GFR calc non Af Amer: 36 mL/min — ABNORMAL LOW (ref >60)
Glucose, Bld: 86 mg/dL (ref 70–99)
Potassium: 3.6 mmol/L (ref 3.5–5.1)
Sodium: 137 mmol/L (ref 135–145)

## 2019-04-20 LAB — CBC
HCT: 21.4 % — ABNORMAL LOW (ref 36.0–46.0)
HCT: 33.2 % — ABNORMAL LOW (ref 36.0–46.0)
Hemoglobin: 6.9 g/dL — CL (ref 12.0–15.0)
Hemoglobin: 11.1 g/dL — ABNORMAL LOW (ref 12.0–15.0)
MCH: 26.7 pg (ref 26.0–34.0)
MCH: 28.2 pg (ref 26.0–34.0)
MCHC: 32.2 g/dL (ref 30.0–36.0)
MCHC: 33.4 g/dL (ref 30.0–36.0)
MCV: 82.9 fL (ref 80.0–100.0)
MCV: 84.5 fL (ref 80.0–100.0)
Platelets: 535 10*3/uL — ABNORMAL HIGH (ref 150–400)
Platelets: 461 10*3/uL — ABNORMAL HIGH (ref 150–400)
RBC: 2.58 MIL/uL — ABNORMAL LOW (ref 3.87–5.11)
RBC: 3.93 MIL/uL (ref 3.87–5.11)
RDW: 14.6 % (ref 11.5–15.5)
RDW: 14.5 % (ref 11.5–15.5)
WBC: 21.7 10*3/uL — ABNORMAL HIGH (ref 4.0–10.5)
WBC: 29.9 10*3/uL — ABNORMAL HIGH (ref 4.0–10.5)
nRBC: 0.1 % (ref 0.0–0.2)
nRBC: 0.1 % (ref 0.0–0.2)

## 2019-04-20 LAB — COMPREHENSIVE METABOLIC PANEL
ALT: 51 U/L — ABNORMAL HIGH (ref 0–44)
AST: 62 U/L — ABNORMAL HIGH (ref 15–41)
Albumin: 1.8 g/dL — ABNORMAL LOW (ref 3.5–5.0)
Alkaline Phosphatase: 48 U/L (ref 38–126)
Anion gap: 9 (ref 5–15)
BUN: 28 mg/dL — ABNORMAL HIGH (ref 6–20)
CO2: 16 mmol/L — ABNORMAL LOW (ref 22–32)
Calcium: 6.9 mg/dL — ABNORMAL LOW (ref 8.9–10.3)
Chloride: 111 mmol/L (ref 98–111)
Creatinine, Ser: 1.69 mg/dL — ABNORMAL HIGH (ref 0.44–1.00)
GFR calc Af Amer: 47 mL/min — ABNORMAL LOW (ref >60)
GFR calc non Af Amer: 41 mL/min — ABNORMAL LOW (ref >60)
Glucose, Bld: 105 mg/dL — ABNORMAL HIGH (ref 70–99)
Potassium: 4.2 mmol/L (ref 3.5–5.1)
Sodium: 136 mmol/L (ref 135–145)
Total Bilirubin: 1.3 mg/dL — ABNORMAL HIGH (ref 0.3–1.2)
Total Protein: 3.8 g/dL — ABNORMAL LOW (ref 6.5–8.1)

## 2019-04-20 LAB — PROTIME-INR
INR: 1.6 — ABNORMAL HIGH (ref 0.8–1.2)
Prothrombin Time: 18.4 seconds — ABNORMAL HIGH (ref 11.4–15.2)

## 2019-04-20 LAB — PREPARE RBC (CROSSMATCH)

## 2019-04-20 LAB — GLUCOSE, CAPILLARY
Glucose-Capillary: 101 mg/dL — ABNORMAL HIGH (ref 70–99)
Glucose-Capillary: 104 mg/dL — ABNORMAL HIGH (ref 70–99)
Glucose-Capillary: 109 mg/dL — ABNORMAL HIGH (ref 70–99)

## 2019-04-20 LAB — HEPATIC FUNCTION PANEL
ALT: 69 U/L — ABNORMAL HIGH (ref 0–44)
AST: 84 U/L — ABNORMAL HIGH (ref 15–41)
Albumin: 1.7 g/dL — ABNORMAL LOW (ref 3.5–5.0)
Alkaline Phosphatase: 62 U/L (ref 38–126)
Bilirubin, Direct: 0.3 mg/dL — ABNORMAL HIGH (ref 0.0–0.2)
Indirect Bilirubin: 0.7 mg/dL (ref 0.3–0.9)
Total Bilirubin: 1 mg/dL (ref 0.3–1.2)
Total Protein: 4.5 g/dL — ABNORMAL LOW (ref 6.5–8.1)

## 2019-04-20 LAB — MRSA PCR SCREENING: MRSA by PCR: NEGATIVE

## 2019-04-20 LAB — HEMOGLOBIN AND HEMATOCRIT, BLOOD
HCT: 22.6 % — ABNORMAL LOW (ref 36.0–46.0)
Hemoglobin: 7.2 g/dL — ABNORMAL LOW (ref 12.0–15.0)

## 2019-04-20 LAB — TRIGLYCERIDES: Triglycerides: 134 mg/dL (ref <150)

## 2019-04-20 LAB — CK: Total CK: 252 U/L — ABNORMAL HIGH (ref 38–234)

## 2019-04-20 LAB — APTT: aPTT: 29 seconds (ref 24–36)

## 2019-04-20 SURGERY — LAPAROTOMY, EXPLORATORY
Anesthesia: General | Site: Abdomen

## 2019-04-20 MED ORDER — PROPOFOL 1000 MG/100ML IV EMUL
INTRAVENOUS | Status: AC
  Filled 2019-04-20: qty 100

## 2019-04-20 MED ORDER — MIDAZOLAM HCL 2 MG/2ML IJ SOLN
INTRAMUSCULAR | Status: AC
  Filled 2019-04-20: qty 2

## 2019-04-20 MED ORDER — SODIUM CHLORIDE 0.9% IV SOLUTION
Freq: Once | INTRAVENOUS | Status: AC
  Administered 2019-04-20: 08:00:00 via INTRAVENOUS

## 2019-04-20 MED ORDER — CHLORHEXIDINE GLUCONATE CLOTH 2 % EX PADS: 6.0000 | MEDICATED_PAD | Freq: Every day | CUTANEOUS | Status: DC

## 2019-04-20 MED ORDER — LIDOCAINE 2% (20 MG/ML) 5 ML SYRINGE
INTRAMUSCULAR | Status: AC
  Filled 2019-04-20: qty 5

## 2019-04-20 MED ORDER — FENTANYL CITRATE (PF) 250 MCG/5ML IJ SOLN
INTRAMUSCULAR | Status: AC
  Filled 2019-04-20: qty 5

## 2019-04-20 MED ORDER — 0.9 % SODIUM CHLORIDE (POUR BTL) OPTIME
TOPICAL | Status: DC | PRN
  Administered 2019-04-20 (×8): 1000 mL

## 2019-04-20 MED ORDER — CHLORHEXIDINE GLUCONATE 0.12% ORAL RINSE (MEDLINE KIT)
15.0000 mL | Freq: Two times a day (BID) | OROMUCOSAL | Status: DC
  Administered 2019-04-20 – 2019-04-28 (×15): 15 mL via OROMUCOSAL

## 2019-04-20 MED ORDER — PROPOFOL 10 MG/ML IV BOLUS
INTRAVENOUS | Status: DC | PRN
  Administered 2019-04-20: 200 mg via INTRAVENOUS

## 2019-04-20 MED ORDER — FENTANYL 2500MCG IN NS 250ML (10MCG/ML) PREMIX INFUSION
50.0000 ug/h | INTRAVENOUS | Status: DC
  Administered 2019-04-20: 100 ug/h via INTRAVENOUS

## 2019-04-20 MED ORDER — SODIUM CHLORIDE 0.9% FLUSH
10.0000 mL | Freq: Two times a day (BID) | INTRAVENOUS | Status: DC
  Administered 2019-04-21 – 2019-04-30 (×11): 10 mL

## 2019-04-20 MED ORDER — SUCCINYLCHOLINE CHLORIDE 20 MG/ML IJ SOLN
INTRAMUSCULAR | Status: DC | PRN
  Administered 2019-04-20: 100 mg via INTRAVENOUS

## 2019-04-20 MED ORDER — ALBUMIN HUMAN 5 % IV SOLN
INTRAVENOUS | Status: DC | PRN
  Administered 2019-04-20: 11:00:00 via INTRAVENOUS

## 2019-04-20 MED ORDER — ROCURONIUM BROMIDE 10 MG/ML (PF) SYRINGE
PREFILLED_SYRINGE | INTRAVENOUS | Status: AC
  Filled 2019-04-20: qty 10

## 2019-04-20 MED ORDER — LIDOCAINE 2% (20 MG/ML) 5 ML SYRINGE
INTRAMUSCULAR | Status: DC | PRN
  Administered 2019-04-20: 50 mg via INTRAVENOUS

## 2019-04-20 MED ORDER — PROPOFOL 10 MG/ML IV BOLUS
INTRAVENOUS | Status: AC
  Filled 2019-04-20: qty 20

## 2019-04-20 MED ORDER — FLUCONAZOLE IN SODIUM CHLORIDE 200-0.9 MG/100ML-% IV SOLN
200.0000 mg | INTRAVENOUS | Status: AC
  Administered 2019-04-20 – 2019-04-24 (×5): 200 mg via INTRAVENOUS
  Filled 2019-04-20 (×5): qty 100

## 2019-04-20 MED ORDER — SODIUM CHLORIDE 0.9 % IV SOLN
INTRAVENOUS | Status: DC | PRN
  Administered 2019-04-20: 40 ug/min via INTRAVENOUS

## 2019-04-20 MED ORDER — HEPARIN SODIUM (PORCINE) 5000 UNIT/ML IJ SOLN
5000.0000 [IU] | Freq: Three times a day (TID) | INTRAMUSCULAR | Status: DC
  Administered 2019-04-20 – 2019-04-25 (×17): 5000 [IU] via SUBCUTANEOUS
  Filled 2019-04-20 (×16): qty 1

## 2019-04-20 MED ORDER — LACTATED RINGERS IV SOLN
INTRAVENOUS | Status: DC | PRN
  Administered 2019-04-20 (×2): via INTRAVENOUS

## 2019-04-20 MED ORDER — ORAL CARE MOUTH RINSE
15.0000 mL | OROMUCOSAL | Status: DC
  Administered 2019-04-20 – 2019-04-21 (×8): 15 mL via OROMUCOSAL

## 2019-04-20 MED ORDER — FENTANYL CITRATE (PF) 100 MCG/2ML IJ SOLN
INTRAMUSCULAR | Status: DC | PRN
  Administered 2019-04-20: 50 ug via INTRAVENOUS
  Administered 2019-04-20: 100 ug via INTRAVENOUS
  Administered 2019-04-20 (×2): 50 ug via INTRAVENOUS

## 2019-04-20 MED ORDER — CHLORHEXIDINE GLUCONATE CLOTH 2 % EX PADS
6.0000 | MEDICATED_PAD | Freq: Every day | CUTANEOUS | Status: DC
  Administered 2019-04-22 – 2019-04-30 (×9): 6 via TOPICAL

## 2019-04-20 MED ORDER — ONDANSETRON HCL 4 MG/2ML IJ SOLN
INTRAMUSCULAR | Status: AC
  Filled 2019-04-20: qty 2

## 2019-04-20 MED ORDER — ONDANSETRON HCL 4 MG/2ML IJ SOLN
INTRAMUSCULAR | Status: DC | PRN
  Administered 2019-04-20: 4 mg via INTRAVENOUS

## 2019-04-20 MED ORDER — FENTANYL BOLUS VIA INFUSION
50.0000 ug | INTRAVENOUS | Status: DC | PRN
  Filled 2019-04-20: qty 50

## 2019-04-20 MED ORDER — FENTANYL CITRATE (PF) 100 MCG/2ML IJ SOLN: 50.0000 ug | Freq: Once | INTRAMUSCULAR | Status: DC

## 2019-04-20 MED ORDER — PROPOFOL 500 MG/50ML IV EMUL
INTRAVENOUS | Status: DC | PRN
  Administered 2019-04-20: 50 ug/kg/min via INTRAVENOUS

## 2019-04-20 MED ORDER — DEXAMETHASONE SODIUM PHOSPHATE 10 MG/ML IJ SOLN
INTRAMUSCULAR | Status: AC
  Filled 2019-04-20: qty 1

## 2019-04-20 MED ORDER — SODIUM CHLORIDE 0.9% FLUSH
10.0000 mL | INTRAVENOUS | Status: DC | PRN
  Administered 2019-04-23 – 2019-04-26 (×2): 10 mL
  Filled 2019-04-20 (×2): qty 40

## 2019-04-20 MED ORDER — ARTIFICIAL TEARS OPHTHALMIC OINT
TOPICAL_OINTMENT | OPHTHALMIC | Status: AC
  Filled 2019-04-20: qty 3.5

## 2019-04-20 MED ORDER — PROPOFOL 1000 MG/100ML IV EMUL
5.0000 ug/kg/min | INTRAVENOUS | Status: DC
  Administered 2019-04-20 – 2019-04-21 (×3): 40 ug/kg/min via INTRAVENOUS
  Filled 2019-04-20 (×4): qty 100

## 2019-04-20 MED ORDER — MIDAZOLAM HCL 5 MG/5ML IJ SOLN
INTRAMUSCULAR | Status: DC | PRN
  Administered 2019-04-20 (×2): 1 mg via INTRAVENOUS

## 2019-04-20 MED ORDER — SODIUM CHLORIDE 0.9 % IV SOLN: INTRAVENOUS | Status: DC | PRN

## 2019-04-20 MED ORDER — SODIUM CHLORIDE 0.9% IV SOLUTION: Freq: Once | INTRAVENOUS | Status: DC

## 2019-04-20 MED ORDER — ROCURONIUM BROMIDE 10 MG/ML (PF) SYRINGE
PREFILLED_SYRINGE | INTRAVENOUS | Status: DC | PRN
  Administered 2019-04-20 (×2): 50 mg via INTRAVENOUS

## 2019-04-20 MED ORDER — STERILE WATER FOR IRRIGATION IR SOLN
Status: DC | PRN
  Administered 2019-04-20: 1000 mL

## 2019-04-20 MED ORDER — INSULIN ASPART 100 UNIT/ML ~~LOC~~ SOLN: 0.0000 [IU] | SUBCUTANEOUS | Status: DC

## 2019-04-20 SURGICAL SUPPLY — 48 items
APL PRP STRL LF DISP 70% ISPRP (MISCELLANEOUS) ×2
BLADE CLIPPER SURG (BLADE) IMPLANT
BNDG GAUZE ELAST 4 BULKY (GAUZE/BANDAGES/DRESSINGS) ×1 IMPLANT
CANISTER SUCT 3000ML PPV (MISCELLANEOUS) ×3 IMPLANT
CHLORAPREP W/TINT 26 (MISCELLANEOUS) ×3 IMPLANT
COVER MAYO STAND STRL (DRAPES) ×1 IMPLANT
COVER SURGICAL LIGHT HANDLE (MISCELLANEOUS) ×3 IMPLANT
COVER WAND RF STERILE (DRAPES) ×3 IMPLANT
DRAIN CHANNEL 19F RND (DRAIN) ×1 IMPLANT
DRAPE LAPAROSCOPIC ABDOMINAL (DRAPES) ×3 IMPLANT
DRAPE WARM FLUID 44X44 (DRAPES) ×3 IMPLANT
DRSG PAD ABDOMINAL 8X10 ST (GAUZE/BANDAGES/DRESSINGS) ×1 IMPLANT
ELECT BLADE 6.5 EXT (BLADE) IMPLANT
ELECT REM PT RETURN 9FT ADLT (ELECTROSURGICAL) ×3
ELECTRODE REM PT RTRN 9FT ADLT (ELECTROSURGICAL) ×2 IMPLANT
EVACUATOR SILICONE 100CC (DRAIN) ×1 IMPLANT
GAUZE SPONGE 4X4 12PLY STRL (GAUZE/BANDAGES/DRESSINGS) ×1 IMPLANT
GLOVE BIO SURGEON STRL SZ7.5 (GLOVE) ×4 IMPLANT
GLOVE INDICATOR 8.0 STRL GRN (GLOVE) ×3 IMPLANT
GOWN STRL REUS W/ TWL LRG LVL3 (GOWN DISPOSABLE) ×2 IMPLANT
GOWN STRL REUS W/ TWL XL LVL3 (GOWN DISPOSABLE) ×2 IMPLANT
GOWN STRL REUS W/TWL LRG LVL3 (GOWN DISPOSABLE) ×3
GOWN STRL REUS W/TWL XL LVL3 (GOWN DISPOSABLE) ×3
HANDLE SUCTION POOLE (INSTRUMENTS) ×2 IMPLANT
KIT BASIN OR (CUSTOM PROCEDURE TRAY) ×3 IMPLANT
KIT TURNOVER KIT B (KITS) ×3 IMPLANT
LIGASURE IMPACT 36 18CM CVD LR (INSTRUMENTS) IMPLANT
NS IRRIG 1000ML POUR BTL (IV SOLUTION) ×6 IMPLANT
PACK GENERAL/GYN (CUSTOM PROCEDURE TRAY) ×3 IMPLANT
PAD ARMBOARD 7.5X6 YLW CONV (MISCELLANEOUS) ×3 IMPLANT
PENCIL SMOKE EVACUATOR (MISCELLANEOUS) ×3 IMPLANT
RELOAD PROXIMATE 75MM BLUE (ENDOMECHANICALS) ×15 IMPLANT
RELOAD STAPLE 75 3.8 BLU REG (ENDOMECHANICALS) IMPLANT
SPECIMEN JAR LARGE (MISCELLANEOUS) IMPLANT
SPONGE LAP 18X18 RF (DISPOSABLE) ×5 IMPLANT
STAPLER GUN LINEAR PROX 60 (STAPLE) ×1 IMPLANT
STAPLER PROXIMATE 75MM BLUE (STAPLE) ×2 IMPLANT
STAPLER VISISTAT 35W (STAPLE) ×3 IMPLANT
SUCTION POOLE HANDLE (INSTRUMENTS) ×9
SUT ETHILON 2 0 FS 18 (SUTURE) ×1 IMPLANT
SUT PDS AB 1 TP1 96 (SUTURE) ×6 IMPLANT
SUT SILK 2 0 SH CR/8 (SUTURE) ×3 IMPLANT
SUT SILK 2 0 TIES 10X30 (SUTURE) ×3 IMPLANT
SUT SILK 3 0 SH CR/8 (SUTURE) ×5 IMPLANT
SUT SILK 3 0 TIES 10X30 (SUTURE) ×3 IMPLANT
TOWEL GREEN STERILE (TOWEL DISPOSABLE) ×3 IMPLANT
TRAY FOLEY MTR SLVR 16FR STAT (SET/KITS/TRAYS/PACK) ×2 IMPLANT
YANKAUER SUCT BULB TIP NO VENT (SUCTIONS) IMPLANT

## 2019-04-20 NOTE — Transfer of Care (Deleted)
Immediate Anesthesia Transfer of Care Note  Patient: Kim Myers  Procedure(s) Performed: EXPLORATORY LAPAROTOMY,  SMALL BOWEL RESECTION (N/A Abdomen) Lysis Of Adhesion (Abdomen) Ileocecetomy (Abdomen)  Patient Location: PACU  Anesthesia Type:General  Level of Consciousness: awake, alert , oriented and patient cooperative  Airway & Oxygen Therapy: Patient Spontanous Breathing  Post-op Assessment: Report given to RN and Post -op Vital signs reviewed and stable  Post vital signs: Reviewed and stable  Last Vitals:  Vitals Value Taken Time  BP    Temp    Pulse    Resp    SpO2      Last Pain:  Vitals:   04/20/19 0800  TempSrc: Oral  PainSc:       Patients Stated Pain Goal: 1 (63/14/97 0263)  Complications: No apparent anesthesia complications

## 2019-04-20 NOTE — Progress Notes (Signed)
Peripherally Inserted Central Catheter/Midline Placement  The IV Nurse has discussed with the patient and/or persons authorized to consent for the patient, the purpose of this procedure and the potential benefits and risks involved with this procedure.  The benefits include less needle sticks, lab draws from the catheter, and the patient may be discharged home with the catheter. Risks include, but not limited to, infection, bleeding, blood clot (thrombus formation), and puncture of an artery; nerve damage and irregular heartbeat and possibility to perform a PICC exchange if needed/ordered by physician.  Alternatives to this procedure were also discussed.  Bard Power PICC patient education guide, fact sheet on infection prevention and patient information card has been provided to patient /or left at bedside.    PICC/Midline Placement Documentation  PICC Triple Lumen 04/20/19 PICC Left Brachial 42 cm 2 cm (Active)  Indication for Insertion or Continuance of Line Administration of hyperosmolar/irritating solutions (i.e. TPN, Vancomycin, etc.) 04/20/19 2028  Exposed Catheter (cm) 2 cm 04/20/19 2028  Site Assessment Clean;Dry;Intact 04/20/19 2028  Lumen #1 Status Blood return noted;Flushed;Saline locked 04/20/19 2028  Lumen #2 Status Blood return noted;Flushed;Saline locked 04/20/19 2028  Lumen #3 Status Blood return noted;Flushed;Saline locked 04/20/19 2028  Dressing Type Transparent;Occlusive;Securing device 04/20/19 2028  Dressing Status Clean;Dry;Intact;Antimicrobial disc in place;Other (Comment) 04/20/19 2028  Line Adjustment (NICU/IV Team Only) No 04/20/19 2028  Dressing Intervention New dressing 04/20/19 2028  Dressing Change Due 04/27/19 04/20/19 2028       Edson Snowball 04/20/2019, 8:57 PM

## 2019-04-20 NOTE — Progress Notes (Signed)
Postop check  Doing reasonably well. Resting comfortably on vent. No pressors. Wound dressed and dry. JP ss. UOP borderline at ~30cc/hr since OR. ABG has improved significantly relative to POC intraop when her pH was noted to be 7.1 . H&H stable on ABG. CBC/CMET/Coags pending.  -Appreciate CCM assistance in her care -Continue broad spec IV abx + fluconazole -MIVF @ 125cc/hr -PICC/TPN anticipated - planning tomorrow for PICC placement -Full set of labs pending - cbc, cmp, coags -PPx: SCDs, will start SQH; on BID PPI currently

## 2019-04-20 NOTE — Transfer of Care (Signed)
Immediate Anesthesia Transfer of Care Note  Patient: Kim Myers  Procedure(s) Performed: EXPLORATORY LAPAROTOMY,  SMALL BOWEL RESECTION, partial omentectomy, drainage of abdominal abscess (N/A Abdomen) Lysis Of Adhesion (Abdomen) Ileocecetomy (Abdomen)  Patient Location: ICU  Anesthesia Type:General  Level of Consciousness: Patient remains intubated per anesthesia plan  Airway & Oxygen Therapy: Patient remains intubated per anesthesia plan and Patient placed on Ventilator (see vital sign flow sheet for setting)  Post-op Assessment: Report given to RN and Post -op Vital signs reviewed and stable  Post vital signs: Reviewed and stable  Last Vitals:  Vitals Value Taken Time  BP 117/81 04/20/19 1325  Temp    Pulse    Resp 39 04/20/19 1325  SpO2    Vitals shown include unvalidated device data.  Last Pain:  Vitals:   04/20/19 0800  TempSrc: Oral  PainSc:       Patients Stated Pain Goal: 1 (40/98/11 9147)  Complications: No apparent anesthesia complications

## 2019-04-20 NOTE — Anesthesia Procedure Notes (Addendum)
Procedure Name: Intubation Date/Time: 04/20/2019 10:26 AM Performed by: Kyung Rudd, CRNA Pre-anesthesia Checklist: Patient identified, Emergency Drugs available, Suction available and Patient being monitored Patient Re-evaluated:Patient Re-evaluated prior to induction Oxygen Delivery Method: Circle system utilized Preoxygenation: Pre-oxygenation with 100% oxygen Induction Type: IV induction, Rapid sequence and Cricoid Pressure applied Laryngoscope Size: Mac and 3 Grade View: Grade I Tube type: Oral Tube size: 7.0 mm Number of attempts: 1 Airway Equipment and Method: Stylet Placement Confirmation: ETT inserted through vocal cords under direct vision,  breath sounds checked- equal and bilateral and positive ETCO2 Secured at: 21 cm Tube secured with: Tape Dental Injury: Teeth and Oropharynx as per pre-operative assessment

## 2019-04-20 NOTE — Progress Notes (Signed)
ABG reported to RN, fio2 weaned to 30%, sat 96%

## 2019-04-20 NOTE — Significant Event (Signed)
Called d/t MEWS-5 d/t T-101.7 and HR-146. BP-118/79. RN has already notified MD and plan is to give tylenol and recheck temp. Please call RRT if assistance needed.

## 2019-04-20 NOTE — Progress Notes (Addendum)
Patient ID: Kim Myers, female   DOB: 25-Jul-1990, 28 y.o.   MRN: [id number]       Subjective: Increased abdominal soreness this morning.  NG placed overnight. Distention has improved subjectively.  Objective: Vital signs in last 24 hours: Temp:  [97.6 F (36.4 C)-101.7 F (38.7 C)] 98.6 F (37 C) (10/27 0500) Pulse Rate:  [125-149] 138 (10/27 0316) Resp:  [18-20] 20 (10/27 0143) BP: (107-138)/(64-94) 130/83 (10/27 0316) SpO2:  [91 %-100 %] 95 % (10/27 0316) Weight:  [73.5 kg] 73.5 kg (10/27 0316) Last BM Date: 04/19/19  Intake/Output from previous day: 10/26 0701 - 10/27 0700 In: 700 [P.O.:700] Out: 1500 [Urine:900; Emesis/NG output:600] Intake/Output this shift: No intake/output data recorded.  PE: NAD, sitting up in bed; NG in place Lungs: CTAB Abd: distended, tympanitic, some BS, tender in epigastrium today which is new  Lab Results:  Recent Labs    04/19/19 0547 04/20/19 0503  WBC 20.3* 21.7*  HGB 8.0* 6.9*  HCT 25.1* 21.4*  PLT 550* 535*   BMET Recent Labs    04/19/19 0547 04/20/19 0503  NA 141 137  K 4.3 3.6  CL 112* 110  CO2 20* 18*  GLUCOSE 114* 86  BUN 34* 30*  CREATININE 1.90* 1.88*  CALCIUM 8.5* 7.9*   PT/INR No results for input(s): LABPROT, INR in the last 72 hours. CMP     Component Value Date/Time   NA 137 04/20/2019 0503   NA 127 (L) 12/22/2017 1449   K 3.6 04/20/2019 0503   CL 110 04/20/2019 0503   CO2 18 (L) 04/20/2019 0503   GLUCOSE 86 04/20/2019 0503   BUN 30 (H) 04/20/2019 0503   BUN 9 12/22/2017 1449   CREATININE 1.88 (H) 04/20/2019 0503   CALCIUM 7.9 (L) 04/20/2019 0503   PROT 5.3 (L) 04/18/2019 0415   PROT 6.8 12/22/2017 1449   ALBUMIN 2.1 (L) 04/18/2019 0415   ALBUMIN 5.0 12/22/2017 1449   AST 64 (H) 04/18/2019 0415   ALT 57 (H) 04/18/2019 0415   ALKPHOS 45 04/18/2019 0415   BILITOT 0.5 04/18/2019 0415   BILITOT 0.2 12/22/2017 1449   GFRNONAA 36 (L) 04/20/2019 0503   GFRAA 41 (L) 04/20/2019 0503   Lipase      Component Value Date/Time   LIPASE 18 04/14/2019 0846       Studies/Results: Ct Abdomen Pelvis Wo Contrast  Result Date: 04/19/2019 CLINICAL DATA:  Low-grade bowel obstruction, abdominal distention. EXAM: CT ABDOMEN AND PELVIS WITHOUT CONTRAST TECHNIQUE: Multidetector CT imaging of the abdomen and pelvis was performed following the standard protocol without IV contrast. COMPARISON:  April 14, 2019. FINDINGS: Lower chest: Small left pleural effusion is noted with adjacent subsegmental atelectasis. Hepatobiliary: No focal liver abnormality is seen. No gallstones, gallbladder wall thickening, or biliary dilatation. Pancreas: Unremarkable. No pancreatic ductal dilatation or surrounding inflammatory changes. Spleen: Normal in size without focal abnormality. Adrenals/Urinary Tract: Adrenal glands are unremarkable. Kidneys are normal, without renal calculi, focal lesion, or hydronephrosis. Bladder is unremarkable. Stomach/Bowel: Status post appendectomy. Stomach is unremarkable. Severely dilated small bowel loops are noted which are more prominent compared to prior exam. There is seen some contrast within the dilated small bowel loops, but no colonic contrast is noted. These findings are highly concerning for distal small bowel obstruction. Distal small bowel loops are not well visualized due to lack of intravenous contrast as well as no oral contrast in the distal small bowel. Vascular/Lymphatic: No significant vascular findings are present. No enlarged abdominal or pelvic lymph  nodes. Reproductive: Uterus and bilateral adnexa are unremarkable. Other: Mild anasarca is noted. Minimal ascites is noted. Small fat containing periumbilical hernia is noted. Musculoskeletal: No acute or significant osseous findings. IMPRESSION: Significantly worsening small bowel dilatation is noted most consistent with distal small bowel obstruction. Mild anasarca is noted.  Minimal ascites is noted. Small left pleural effusion  is noted with adjacent subsegmental atelectasis. Electronically Signed   By: Marijo Conception M.D.   On: 04/19/2019 13:03   Dg Abd Portable 1v  Result Date: 04/18/2019 CLINICAL DATA:  Abdominal pain EXAM: PORTABLE ABDOMEN - 1 VIEW COMPARISON:  Abdominal radiograph dated 04/16/2019 FINDINGS: The bowel gas pattern is normal. Air-fluid levels cannot be assessed on the supine radiograph. No radio-opaque calculi or other significant radiographic abnormality are seen. IMPRESSION: Nonobstructive bowel gas pattern. Electronically Signed   By: Zerita Boers M.D.   On: 04/18/2019 16:15    Anti-infectives: Anti-infectives (From admission, onward)   Start     Dose/Rate Route Frequency Ordered Stop   04/19/19 1130  piperacillin-tazobactam (ZOSYN) IVPB 3.375 g     3.375 g 12.5 mL/hr over 240 Minutes Intravenous Every 8 hours 04/19/19 1122     04/15/19 1200  levofloxacin (LEVAQUIN) IVPB 750 mg  Status:  Discontinued     750 mg 100 mL/hr over 90 Minutes Intravenous Every 24 hours 04/15/19 1127 04/15/19 1149   04/15/19 1200  levofloxacin (LEVAQUIN) IVPB 750 mg  Status:  Discontinued     750 mg 100 mL/hr over 90 Minutes Intravenous Every 48 hours 04/15/19 1149 04/19/19 1110   04/14/19 1100  cefTRIAXone (ROCEPHIN) 1 g in sodium chloride 0.9 % 100 mL IVPB  Status:  Discontinued     1 g 200 mL/hr over 30 Minutes Intravenous Every 24 hours 04/14/19 1055 04/15/19 1127       Assessment/Plan Ileus vs SBO on CT - unsure of etiology of symptoms but possible this is enteritis compounded by severe dehydration/ ARF, however given increased distention/tenderness and failure to resolve I believe potential benefits of surgery outweigh risks. She has also had leukocytosis for the last 3 days and has been dealing with obstructive like symptoms now for almost a week -The anatomy and physiology of the GI tract was discussed with her today. The pathophysiology of bowel obstruction was discussed at length as well -We  discussed proceeding with surgery to further assess - possible adhesiolysis, possible bowel resection all based upon intraoperative findings -The planned procedure, material risks (including, but not limited to, pain, bleeding, infection, scarring, need for blood transfusion, damage to surrounding structures- blood vessels/nerves/viscus/organs, leak from anastomosis, need for additional procedures, worsening of pre-existing medical conditions, hernia, recurrence, pneumonia, heart attack, stroke, death) benefits and alternatives to surgery were discussed at length. I noted a good probability that the procedure would help improve her symptoms and would certainly allow [place] to assess everything. The patient's questions were answered to her satisfaction, she voiced understanding and elected to proceed with surgery. Additionally, we discussed typical postoperative expectations and the recovery process.  VTE: SCD's, lovenoxper medicine AS:[id number] 10/21>>10/22, Levaquin 10/22>> Foley:none Follow up:TBD   LOS: 6 days    Ileana Roup , [facility] 04/20/2019, 7:49 AM

## 2019-04-20 NOTE — Progress Notes (Addendum)
[place] NOTE   Pharmacy Consult for TPN Indication: SBO  Patient Measurements: Height: 5\' 6"  (167.6 cm) Weight: 162 lb 0.6 oz (73.5 kg) IBW/kg (Calculated) : 59.3 TPN AdjBW (KG): 60.4 Body mass index is 26.15 kg/m. Usual Weight: TBD  Assessment: 64 YOF presenting epigastric abd pain, nausea and vomiting.  Distention despite having semi-solid stool and tolerated clear liquid diet on 10/26, however CT shows SBO and for exlap 10/27> found sbo (intraabdominal abscesses), had small bowel recestion + drainage of abscesses and partial omentectomy.  Resolving hx of anorexia, has gained weight since this summer.  Albumin low this admission.  GI: NG output 335ml, in surgery at this time. Endo: No hx of DM, CBGs 86-125 Insulin requirements in the past 24 hours: n/a Lytes: K 3.6, CO2 18, CoCa ok Renal: SCr 3.83>1.88 AKI improving, BUN to 30, UOP 0.5 ml/kg/hr Pulm: RA Cards: Tachy, SBP ok Hepatobil: Mild AST/ALT elevation, Tbili 1 (pending hepatitis workup, SBP r/o, some ascities) Neuro: ID: Zosyn for intra-abdominal inf   TPN Access: TBD TPN start date: 04/21/19 Nutritional Goals (Awaiting RD recs): KCal: 1750-2100 Protein: 105g Fluid: 2L  Goal TPN rate is 70 ml/hr  Current Nutrition:   Plan:  Start TPN at 30 mL/hr on 10/28 d/t 1200 new order cuttoff and need for central line placement Initial TPN labs in AM F/u central line placement F/u RD recs and update nutritional goals  Bertis Ruddy, PharmD Clinical Pharmacist Please check AMION for all [place] numbers 04/20/2019 1:50 PM

## 2019-04-20 NOTE — Consult Note (Signed)
NAME:  Kim Myers, MRN:  [id number], DOB:  Apr 29, 1991, LOS: 6 ADMISSION DATE:  04/14/2019, CONSULTATION DATE:  04/20/19  REFERRING MD: Dr Dema Severin , CHIEF COMPLAINT:  Post operative resp insuff.   Brief History   29 yo presented with constipation found to have sbo that ultimately warranted ex lap. Post operative resp insufficiency and we have been asked to consult for vent management.   History of present illness   28 yo cf with pmh anorexia and gluten intolerance presented with constipation and abdominal pain 10/21 with suspected sbo vs enteritis vs ileus.. General surgery was consulted and have been able to medical manage condition with improvement in symptoms until 10/26 when pt had increased fever and ct with significant small bowel dilation. She was take to OR on 10/27 for ex lap with findings of sbo 2/2 volvulus of ileum and intraabdominal abcesses. She had small bowel resection with drainage of abscesses and partial omentectomy.   We have been asked to consult for vent management on this pt.   Past Medical History   Past Medical History:  Diagnosis Date  . Amenorrhea   . Anemia   . Blood transfusion without reported diagnosis   . Gluten intolerance   . History of anemia    resolved with dietary changes  . Hypothyroidism (acquired) 03/25/2018    Significant Hospital Events:  10/27: OR for ex lap with small bowel resection Consults:  10/21 surgery 10/22 nephro 10/27 CCM  Procedures:  10/27 ex lap  Significant Diagnostic Tests:  Ct abd/pelvis 10/21: 1. Diffusely dilated air-filled loops of small bowel throughout the abdomen may represent enteritis with associated ileus versus small-bowel obstruction. CT with oral contrast or small-bowel series may provide better evaluation. 2. Small to moderate ascites. 3. No hydronephrosis or nephrolithiasis. Renal u/s 10/21: 1. Negative for obstructive uropathy. 2. Slightly increased renal cortical echogenicity of the right kidney,  which could reflect medical renal disease. 3. Left kidney is asymmetrically large relative to the right suggesting compensatory hypertrophy. 4. Small to moderate volume ascites. Ct abd/pelvis 10/26: Significantly worsening small bowel dilatation is noted most consistent with distal small bowel obstruction.  Mild anasarca is noted.  Minimal ascites is noted.  Small left pleural effusion is noted with adjacent subsegmental atelectasis.  Micro Data:  10/21 urine: neg 10/21 blood: ngtd 10/21 sars2: neg  Antimicrobials:  Ceftriaxone 10/21 Levofloxacin 10/22->10/24 Zosyn 10/26->  Interim history/subjective:  10/27: s/p exlap  Objective   Blood pressure (!) 147/95, pulse (!) 114, temperature 98.7 F (37.1 C), temperature source Oral, resp. rate (!) 22, height 5\' 6"  (1.676 m), weight 73.5 kg, SpO2 98 %.        Intake/Output Summary (Last 24 hours) at 04/20/2019 1319 Last data filed at 04/20/2019 1245 Gross per 24 hour  Intake 3804 ml  Output 2075 ml  Net 1729 ml   Filed Weights   04/17/19 0417 04/19/19 0429 04/20/19 0316  Weight: 61.9 kg 69.2 kg 73.5 kg    Examination: General: no acute distress, ill appearing female sedated on vent.  HEENT: NCAT,  PERRLA, MMMP, nose ring in place, ng and ett.  Lungs: CTA, no wheezes, rhonchi, rales Cardiovascular: RRR, no m/g/r Abdomen: post operative with dressing in place c/d/i Extremities: + edema diffuse Skin: + dry, scaling and redness on b/l fingers/hands, warm and dry Neuro: sedated unresponsive GU: swollen labia  Resolved Hospital Problem list     Assessment & Plan:  Post operative resp insufficiency:  -titrate vent.  -sbt when able  but let rest for the evening.  -sedation ongoing with Rass -2 as goal  -add fentanyl for pain management.   sbo with volvulus s/p exlap with resection:  Intra-abdominal abcesses:  -on zosyn  -will add fluc for 3-5 days to cover intra-abdominal with known abcesses, should be adequate  duration now that has been drained with wash outs.   Aki:  -monitor indices -renal u/s ok -follow I/o -ivf ongoing -Recheck ck  With elevation initially, may be contributing  Elevated CK:  -recheck  Transaminitis:  -follow  Acute on chronic normocytic anemia:  -transfuse <7 -will recheck this evening as well.  Best practice:  Diet: npo Pain/Anxiety/Delirium protocol (if indicated): yes VAP protocol (if indicated): yes DVT prophylaxis: scd GI prophylaxis: ppi Glucose control: monitor Mobility: bedrest Code Status: full Family Communication: per primary Disposition: ICU  Labs   CBC: Recent Labs  Lab 04/14/19 0846  04/16/19 0653 04/17/19 0457 04/18/19 0415 04/19/19 0547 04/20/19 0503 04/20/19 0656  WBC 11.1*   < > 9.0 8.5 11.0* 20.3* 21.7*  --   NEUTROABS 9.5*  --   --   --   --   --   --   --   HGB 9.5*   < > 7.9* 8.0* 8.6* 8.0* 6.9* 7.2*  HCT 29.1*   < > 24.3* 24.7* 27.0* 25.1* 21.4* 22.6*  MCV 82.7   < > 82.9 84.3 85.4 86.0 82.9  --   PLT 458*   < > 379 455* 510* 550* 535*  --    < > = values in this interval not displayed.    Basic Metabolic Panel: Recent Labs  Lab 04/14/19 1700  04/15/19 0822  04/17/19 0457 04/18/19 0415 04/18/19 2238 04/19/19 0547 04/20/19 0503  NA  --    < > 136   < > 145 142 137 141 137  K  --    < > 4.2   < > 3.7 3.3* 4.3 4.3 3.6  CL  --    < > 102   < > 112* 110 111 112* 110  CO2  --    < > 22   < > 20* 23 17* 20* 18*  GLUCOSE  --    < > 98   < > 109* 125* 103* 114* 86  BUN  --    < > 51*   < > 48* 41* 35* 34* 30*  CREATININE  --    < > 2.94*   < > 2.22* 2.11* 1.73* 1.90* 1.88*  CALCIUM  --    < > 8.0*   < > 8.6* 8.3* 8.1* 8.5* 7.9*  MG 2.3  --  2.6*  --   --   --   --   --   --   PHOS 4.7*  --   --   --   --   --   --   --   --    < > = values in this interval not displayed.   GFR: Estimated Creatinine Clearance: 45.7 mL/min (A) (by C-G formula based on SCr of 1.88 mg/dL (H)). Recent Labs  Lab 04/14/19 0944  04/15/19  0822 04/15/19 1005  04/17/19 0457 04/18/19 0415 04/19/19 0547 04/20/19 0503  WBC  --    < >  --   --    < > 8.5 11.0* 20.3* 21.7*  LATICACIDVEN 1.8  --  0.9 0.8  --   --   --   --   --    < > =  values in this interval not displayed.    Liver Function Tests: Recent Labs  Lab 04/15/19 0255 04/15/19 0822 04/16/19 0653 04/18/19 0415 04/20/19 0505  AST 29 33 53* 64* 84*  ALT 17 20 34 57* 69*  ALKPHOS 37* 38 43 45 62  BILITOT 0.6 0.9 1.0 0.5 1.0  PROT 5.4* 5.4* 5.6* 5.3* 4.5*  ALBUMIN 2.3* 2.3* 2.2* 2.1* 1.7*   Recent Labs  Lab 04/14/19 0846  LIPASE 18   No results for input(s): AMMONIA in the last 168 hours.  ABG    Component Value Date/Time   TCO2 23 04/20/2017 1307     Coagulation Profile: Recent Labs  Lab 04/14/19 1055  INR 1.3*    Cardiac Enzymes: Recent Labs  Lab 04/15/19 1005  CKTOTAL 790*    HbA1C: No results found for: HGBA1C  CBG: Recent Labs  Lab 04/19/19 0738  GLUCAP 89    Review of Systems:   Unobtainable 2/2 intubated and sedated  Past Medical History  She,  has a past medical history of Amenorrhea, Anemia, Blood transfusion without reported diagnosis, Gluten intolerance, History of anemia, and Hypothyroidism (acquired) (03/25/2018).   Surgical History    Past Surgical History:  Procedure Laterality Date  . MECKEL DIVERTICULUM EXCISION  at 19   started as laparoscopy, ended in laparotomy     Social History   reports that she has never smoked. She has never used smokeless tobacco. She reports that she does not drink alcohol or use drugs.   Family History   Her family history includes Breast cancer in her paternal grandmother; Ovarian cancer in her maternal grandmother.   Allergies Allergies  Allergen Reactions  . Gluten Meal Diarrhea, Nausea And Vomiting and Other (See Comments)    Caused internal bleeding that resulted in hospitalization!!  . Cauliflower [Brassica Oleracea Italica]   . Food     Red Grape Apple   .  Peanut-Containing Drug Products      Home Medications  Prior to Admission medications   Not on File     Critical care time: The patient is critically ill with multiple organ systems failure and requires high complexity decision making for assessment and support, frequent evaluation and titration of therapies, application of advanced monitoring technologies and extensive interpretation of multiple databases.  Critical care time 37 mins. This represents my time independent of the NP's/PA's/med students/residents time taking care of the pt. This is excluding procedures.     Briant Sites DO After hours pager: [phone]  [facility] 04/20/2019, 1:19 PM

## 2019-04-20 NOTE — Progress Notes (Signed)
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 Patient signs and symptoms concerning for small bowel obstruction versus ileus due to enteritis.  Patient's clinical course was complicated by bladder outlet obstruction with AKI and presented with an upper GI bleed.   Overnight, the patient spike another fever up to 100.7 and was give tylenol. On Examination today, the patient is tachycardic and continues to complain of abdominal pain and bloating. She admits to shortness of breath, but states that is has been stable since admission and is not requiring supplemental oxygen at this time. She denies nausea since NG tube has been replaced. She is awaiting surgery!   Objective:  Vital signs in last 24 hours: Vitals:   04/20/19 0302 04/20/19 0316 04/20/19 0438 04/20/19 0500  BP: 107/64 130/83    Pulse: (!) 149 (!) 138    Resp:      Temp: (!) 100.7 F (38.2 C) 99.6 F (37.6 C) (!) 100.6 F (38.1 C) 98.6 F (37 C)  TempSrc: Oral Oral Oral Oral  SpO2: 91% 95%    Weight:  73.5 kg    Height:        Physical Exam: Physical Exam  Constitutional: She is oriented to person, place, and time. No distress.  HENT:  Head: Atraumatic.  Cardiovascular: Regular rhythm, normal heart sounds, intact distal pulses and normal pulses. Tachycardia present. Exam reveals no gallop and no friction rub.  No murmur heard. Pulmonary/Chest: Effort normal. No respiratory distress. She exhibits no tenderness.  Abdominal: She exhibits distension. There is abdominal tenderness (diffusely ).  Genitourinary:    Genitourinary Comments: Foley catheter in place with good urine output   Musculoskeletal: Normal range of motion.        General: Edema  (bilateral lower extremity edema since yesterday evening) present.  Neurological: She is alert and oriented to person, place, and time.  Skin: Skin is warm. She is diaphoretic. There is pallor.    Pertinent labs/Imaging: CBC Latest Ref Rng & Units 04/20/2019 04/19/2019 04/18/2019  WBC 4.0 - 10.5 K/uL 21.7(H) 20.3(H) 11.0(H)  Hemoglobin 12.0 - 15.0 g/dL 6.9(LL) 8.0(L) 8.6(L)  Hematocrit 36.0 - 46.0 % 21.4(L) 25.1(L) 27.0(L)  Platelets 150 - 400 K/uL 535(H) 550(H) 510(H)    CMP Latest Ref Rng & Units 04/20/2019 04/19/2019 04/18/2019  Glucose 70 - 99 mg/dL 86 114(H) 103(H)  BUN 6 - 20 mg/dL 30(H) 34(H) 35(H)  Creatinine 0.44 - 1.00 mg/dL 1.88(H) 1.90(H) 1.73(H)  Sodium 135 - 145 mmol/L 137 141 137  Potassium 3.5 - 5.1 mmol/L 3.6 4.3 4.3  Chloride 98 - 111 mmol/L 110 112(H) 111  CO2 22 - 32 mmol/L 18(L) 20(L) 17(L)  Calcium 8.9 - 10.3 mg/dL 7.9(L) 8.5(L) 8.1(L)  Total Protein 6.5 - 8.1 g/dL - - -  Total Bilirubin 0.3 - 1.2 mg/dL - - -  Alkaline Phos 38 - 126 U/L - - -  AST 15 - 41 U/L - - -  ALT 0 - 44 U/L - - -   Hepatic Function Latest Ref Rng & Units 04/18/2019 04/16/2019 04/15/2019  Total Protein 6.5 - 8.1 g/dL 5.3(L) 5.6(L) 5.4(L)  Albumin 3.5 - 5.0 g/dL 2.1(L) 2.2(L) 2.3(L)  AST 15 - 41 U/L 64(H) 53(H) 33  ALT 0 - 44 U/L 57(H) 34 20  Alk Phosphatase 38 - 126 U/L 45 43 38  Total Bilirubin 0.3 - 1.2 mg/dL 0.5 1.0 0.9   Assessment/Plan:  Principal Problem:   SBO (small bowel obstruction) (HCC) Active Problems:   Hypothyroidism (acquired)   Amenorrhea   Acute renal injury (HCC)   Elevated TSH   Elevated serum free T4 level   Hyponatremia   Leukocytosis   Anemia    Patient Summary: 28 year old female with a pPMHx of anorexia, amenorrhea (hypothalamic hypogonadism), Hx of Meckel's diverticulum (s/p small bowel resection) who presented with signs symptoms concerning for SBO. Clinical course complicated by AKI and upper GI bleed.  Patient was evaluated prior to  surgical procedure. Patient had NG tube replaced yesterday evening.  Surgery scheduled for this morning.  Morning CBC showed hemoglobin of 6.9.  She was transfused 1 unit of blood at that time.  On evaluation today patient states that she is not having any nausea or vomiting.  She does complain of abdominal pain with abdominal bloating. Patient is of of surgery at this time.  #SBO Spoke with surgical team about patient's progress.  Patient apparently had small bowel volvulus with necrotic bowel and abscess.  Patient remain intubated at this time and transferred to the ICU for him monitoring overnight.  Plan for extubation tomorrow.   - Appreciate surgeries assistance.   #AKI - Cr 1.88 today this AM - GFR 35  #GI Bleed #Iron Deficiency Anemia - Coffee ground NG tube output concerning for recurrence of upper GI bleed.  - Acute drop in hemoglobin to 6.9 today.  The patient had 1 unit of blood was transfused prior to surgery.  Apparently the patient needed another 2 units during surgery.   Diet: NPO IVF: NS VTE: SCDs Code: Full [place]  Dispo: Anticipated discharge Pending clinical improvement.Marianna Payment, MD 04/20/2019, 7:55 AM Pager: [phone]

## 2019-04-20 NOTE — Op Note (Signed)
04/14/2019 - 04/20/2019  12:22 PM  PATIENT:  Kim Myers  28 y.o. female  Patient Care Team: Salvadore Dom, MD as PCP - General (Obstetrics and Gynecology)  PRE-OPERATIVE DIAGNOSIS:  Small bowel obstruction  POST-OPERATIVE DIAGNOSIS: 1. 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 Intraabdominal abscesses  PROCEDURE:   1. Exploratory laparotomy with ileocecectomy 2. Small bowel resection 3. Drainage of intra-abdominal abscess 4. Partial omentectomy 5. Adhesiolysis x 60 minutes  SURGEON:  Sharon Mt. Karra Pink, MD  ASSISTANT: Coralie Keens, MD  ANESTHESIA:   general  COUNTS:  Sponge, needle and instrument counts were reported correct x2 at the conclusion of the operation.  EBL: 150 mL  DRAINS: 19 Fr round blake drain left drainage pelvis where abscess in her pelvis extended  SPECIMEN: 1. Distal ileum and cecum (en bloc) 2. Small bowel 3. Omentum  COMPLICATIONS: None  FINDINGS: Dark ascites on entry. Gangrenous/liquified segment of ileum which ended just before the ileocecal valve. This had walled itself off in her lower abdomen. There was fibrinous rind in this location. She has a long redundant mesentery which had allowed this segment to essentially twist on itself. She also had adhesions and Ladd's bands present which were lysed. Proximal to the resected segment there were multiple serosal tears which had occurred where the bowel was dilated. On attempted repairs the wall was quite thin and not amenable to repair as the bowel wall would tear, therefore this segment of small bowel was removed. An ileocolic anastomosis was then created between mid ileum and ascending colon. Additional Ladd's bands were present between small bowel and mesentery which were lysed.  Sizable fluid collection/loculated ascites and abscess in the pelvis which was between rectum and uterus extending down to pouch of Douglas. This was drained and a 19 Fr round blake  left draining the area. As much of the fibrinous rind on the peritoneum as was possible was removed without difficulty. Her duodenum extended to the left of midline and her cecum was in the right lower quadrant - not consistent with any complete malrotation.  DISPOSITION: PACU in satisfactory condition  INDICATION: 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  She underwent evaluation that time he had undergone CT scan which demonstrated diffusely dilated air-filled loops of small bowel throughout the abdomen.  She was admitted to hospital and observed.  Over the ensuing 5 to 6 days, she failed to progress.  She had been having intermittent fevers as well as diarrhea.  She had been advanced on her diet to clears which she was tolerating without vomiting but remained distended.  There was some concern about a possible enteritis.  She also had acute kidney injury and anemia of unclear etiology.  I evaluated her yesterday in which point she said she was feeling much better and continuing to have bowel movements.  She did remain distended.  She was asking when she can go home.  She underwent repeat CT scan given her persistent distention to evaluate when I press was related to bowel dilation or ascites.  She was found to have significant dilation of the small bowel and was again read as concerning for distal small bowel obstruction.  Given these findings and no improvement, we had discussed proceeding with exploratory laparotomy to further evaluate. Please refer to notes elsewhere for details regarding  this discussion.  DESCRIPTION: The patient was identified in preop holding and taken to the OR where she was placed on the operating room table. SCDs were placed. General endotracheal anesthesia was  induced without difficulty.  Foley catheter and NG tube were in place. Pressure points were then evaluated and padded. She was then prepped and draped in the usual sterile fashion. A surgical timeout was performed indicating the correct patient, procedure, positioning and need for preoperative antibiotics.   A midline incision was created and carried down through subcutaneous tissues.  Well below her prior surgical incision, fascia was incised sharply and elevated between clamps. The peritoneum was then opened sharply and entry was clean. The incision was opened full length. There were omental adhesions in the upper midline which were taken down sharply.  There was dark murky ascites on entry.  This was evacuated.  Small bowel was eviscerated.  The distal ileum was somewhat cocooned/matted on itself in the lower midline pelvis - this was gangrenous and had evidence of liquefied necrosis. After freeing it up bluntly from where it was matted the remained of the small bowel was able to be seen. There was significant dilation of her small bowel proximal to this with serosal tears from the dilation just proximal to the necrotic portion. The entire small bowel was then evaluated. Her ligament of Treitz was identified and found to be just to the left of midline.  Her duodenum across midline.  Her cecum was actually present in the right lower quadrant.  Her transverse colon across her abdomen down to the descending colon.  All of her small bowel up to her distal ileum was viable in appearance and pink.  Her colon was viable.  Her sigmoid was viable.  There was a large abscess cavity/loculated ascites present in the pelvis which was accessed and drained.  The necrotic segment of small intestine which was distal ileum appeared to have torsed on itself or there could have been a kink in the mesentery at a site of a Ladd's band.  Because it was necrotic, it is difficult to discern what exactly the etiology was but clearly  involved some sort of twisting or kinking of the mesentery at that level.  This band was divided to be splayed out.  The necrotic segment extended up to an area that was approximately 3 cm proximal to the ileocecal valve.  Given these findings, the decision was made to proceed with resection of the segment including the cecum.  Healthy bowel proximal to this was selected.  A window was created the mesentery and this was divided with a linear cutting stapler.  The distal point of transection on the proximal ascending colon wound is created in the mesentery here as well.  The mesentery was extremely mobile and no mobilization was even necessary of the cecum.  This was advised stapler.  The intervening segment of mesentery divided using the LigaSure.  This was passed off the specimen.  There were multiple small deserosalization of the head occurred just proximal to the segment of intestine.  On attempted repair of these with 3-0 silk, the bowel twice then and the sutures were easily pulling through.  Therefore, the decision was made to resect this segment of small bowel as well.  Using one of these, the bowel was opened and decompressed with the Pool tip suction. Proximal to this, a window was created in the mesentery and divided with a linear cutting blue load stapler.  Mesentery was then divided  with the LigaSure.  This was all inspected and noted to be hemostatic.  This was passed off the specimen.  Small bowel was then run again to ensure there are no other deserosalization.  There were two other serosal tears found which were short and on normal thickness bowel wall amenable to repair -these were repaired with Lembert sutures-3-0 silk. These were also secondary to bowel wall dilation previously.  The process had contained itself given her otherwise healthy status, the decision was made to perform an ileocolic anastomosis.  Orientation was confirmed at the ileum and aligned next to the ascending colon.   Enterotomy and colotomy were then made.  The anastomosis was then created with a 75 mm blue load stapler.  This was inspected and hemostatic.  The common enterotomy was then closed using a TX 60 blue load stapler.  The corners of the staple lines were then dunked with 3-0 silk.  A suture was placed at the apex of the anastomosis.  Anastomosis was palpated and noted to be widely patent.  A segment of omentum which had likely been aiding in walling off this process appeared poorly perfused/necrotic and therefore excised using the LigaSure.  The abdomen is irrigated with copious amounts of warm saline-approximately 9 L.  The effluent ran clear.  Orientation of the bowel was again confirmed.  There were a few other lab bands which were subsequently identified and lysed sharply.  The mesentery lay nicely in her abdomen, splayed out widely. A 19 Fr round blake drain was then placed in the right lower abdomen and left draining the pelvis where the loculated ascites/abscess was present.  Sponge, needle, and instrument counts were reported correct x2.  Attention was then turned to closing the abdomen.  Omentum was brought down over the midline.  The fascia was then closed with 2 running #1 PDS sutures.  Given the findings intraoperatively, the skin was intentionally left open and packed with moist Kerlix. This was covered with 4x4 and an ABD pad, secured with tape.  Arterial blood gas was then obtained at conclusion prior to planned extubation and she was found to have a not completely unexpected metabolic acidosis and decision was made to keep her intubated and return to ICU. She was then transferred to a bed for transport to ICU in critical but stable condition. Critical care has been consulted. I have updated her mother via phone

## 2019-04-20 NOTE — Progress Notes (Signed)
I called pt's mother and gave her an update. [place] established. Belongings from prior room brought up including Kindle, cell phone and green charger.

## 2019-04-20 NOTE — Anesthesia Preprocedure Evaluation (Signed)
Anesthesia Evaluation  Patient identified by MRN, date of birth, ID band Patient awake    Reviewed: Allergy & Precautions, H&P , NPO status , Patient's Chart, lab work & pertinent test results  Airway Mallampati: II   Neck ROM: full    Dental   Pulmonary neg pulmonary ROS,    breath sounds clear to auscultation       Cardiovascular negative cardio ROS   Rhythm:regular Rate:Normal     Neuro/Psych    GI/Hepatic   Endo/Other  Hypothyroidism   Renal/GU Renal InsufficiencyRenal disease     Musculoskeletal   Abdominal   Peds  Hematology  (+) Blood dyscrasia, anemia ,   Anesthesia Other Findings   Reproductive/Obstetrics                             Anesthesia Physical Anesthesia Plan  ASA: II  Anesthesia Plan: General   Post-op Pain Management:    Induction: Intravenous, Rapid sequence and Cricoid pressure planned  PONV Risk Score and Plan: 3 and Ondansetron, Dexamethasone, Midazolam and Treatment may vary due to age or medical condition  Airway Management Planned: Oral ETT  Additional Equipment:   Intra-op Plan:   Post-operative Plan: Extubation in OR  Informed Consent: I have reviewed the patients History and Physical, chart, labs and discussed the procedure including the risks, benefits and alternatives for the proposed anesthesia with the patient or authorized representative who has indicated his/her understanding and acceptance.       Plan Discussed with: CRNA, Anesthesiologist and Surgeon  Anesthesia Plan Comments:         Anesthesia Quick Evaluation

## 2019-04-20 NOTE — Progress Notes (Signed)
Informed by Lab Hgb 6.9. Will page provider.

## 2019-04-20 NOTE — Anesthesia Procedure Notes (Signed)
Arterial Line Insertion Start/End10/26/2020 12:20 PM, 04/19/2019 12:30 PM Performed by: Verdie Drown, CRNA  Patient location: OR. Preanesthetic checklist: patient identified, IV checked, site marked, risks and benefits discussed, surgical consent, monitors and equipment checked, pre-op evaluation and timeout performed Right, radial was placed Catheter size: 20 G Hand hygiene performed , maximum sterile barriers used  and Seldinger technique used Allen's test indicative of satisfactory collateral circulation Attempts: 2 Procedure performed without using ultrasound guided technique. Following insertion, Biopatch and dressing applied. Post procedure assessment: normal  Patient tolerated the procedure well with no immediate complications.

## 2019-04-21 ENCOUNTER — Encounter (HOSPITAL_COMMUNITY): Payer: Self-pay | Admitting: Surgery

## 2019-04-21 DIAGNOSIS — K56609 Unspecified intestinal obstruction, unspecified as to partial versus complete obstruction: Secondary | ICD-10-CM

## 2019-04-21 DIAGNOSIS — J9601 Acute respiratory failure with hypoxia: Secondary | ICD-10-CM | POA: Diagnosis not present

## 2019-04-21 DIAGNOSIS — J9600 Acute respiratory failure, unspecified whether with hypoxia or hypercapnia: Secondary | ICD-10-CM

## 2019-04-21 LAB — TYPE AND SCREEN
ABO/RH(D): A NEG
Antibody Screen: NEGATIVE
Unit division: 0
Unit division: 0
Unit division: 0

## 2019-04-21 LAB — GI PATHOGEN PANEL BY PCR, STOOL

## 2019-04-21 LAB — DIFFERENTIAL
Abs Immature Granulocytes: 0 10*3/uL (ref 0.00–0.07)
Basophils Absolute: 0 10*3/uL (ref 0.0–0.1)
Basophils Relative: 0 %
Eosinophils Absolute: 0 10*3/uL (ref 0.0–0.5)
Eosinophils Relative: 0 %
Lymphocytes Relative: 3 %
Lymphs Abs: 0.7 10*3/uL (ref 0.7–4.0)
Monocytes Absolute: 0 10*3/uL — ABNORMAL LOW (ref 0.1–1.0)
Monocytes Relative: 0 %
Neutro Abs: 22.5 10*3/uL — ABNORMAL HIGH (ref 1.7–7.7)
Neutrophils Relative %: 97 %
nRBC: 0 /100 WBC

## 2019-04-21 LAB — BPAM RBC
Blood Product Expiration Date: 202011112359
Blood Product Expiration Date: 202011212359
Blood Product Expiration Date: 202011232359
ISSUE DATE / TIME: 202010270911
ISSUE DATE / TIME: 202010271045
ISSUE DATE / TIME: 202010271045
Unit Type and Rh: 600
Unit Type and Rh: 600
Unit Type and Rh: 600

## 2019-04-21 LAB — CBC
HCT: 25.8 % — ABNORMAL LOW (ref 36.0–46.0)
Hemoglobin: 8.6 g/dL — ABNORMAL LOW (ref 12.0–15.0)
MCH: 28.6 pg (ref 26.0–34.0)
MCHC: 33.3 g/dL (ref 30.0–36.0)
MCV: 85.7 fL (ref 80.0–100.0)
Platelets: 446 10*3/uL — ABNORMAL HIGH (ref 150–400)
RBC: 3.01 MIL/uL — ABNORMAL LOW (ref 3.87–5.11)
RDW: 15 % (ref 11.5–15.5)
WBC: 23.2 10*3/uL — ABNORMAL HIGH (ref 4.0–10.5)
nRBC: 0.1 % (ref 0.0–0.2)

## 2019-04-21 LAB — TRIGLYCERIDES: Triglycerides: 216 mg/dL — ABNORMAL HIGH (ref <150)

## 2019-04-21 LAB — GLUCOSE, CAPILLARY
Glucose-Capillary: 106 mg/dL — ABNORMAL HIGH (ref 70–99)
Glucose-Capillary: 103 mg/dL — ABNORMAL HIGH (ref 70–99)
Glucose-Capillary: 111 mg/dL — ABNORMAL HIGH (ref 70–99)
Glucose-Capillary: 110 mg/dL — ABNORMAL HIGH (ref 70–99)
Glucose-Capillary: 128 mg/dL — ABNORMAL HIGH (ref 70–99)

## 2019-04-21 LAB — COMPREHENSIVE METABOLIC PANEL
ALT: 42 U/L (ref 0–44)
AST: 59 U/L — ABNORMAL HIGH (ref 15–41)
Albumin: 1.4 g/dL — ABNORMAL LOW (ref 3.5–5.0)
Alkaline Phosphatase: 123 U/L (ref 38–126)
Anion gap: 9 (ref 5–15)
BUN: 29 mg/dL — ABNORMAL HIGH (ref 6–20)
CO2: 18 mmol/L — ABNORMAL LOW (ref 22–32)
Calcium: 7 mg/dL — ABNORMAL LOW (ref 8.9–10.3)
Chloride: 115 mmol/L — ABNORMAL HIGH (ref 98–111)
Creatinine, Ser: 2.01 mg/dL — ABNORMAL HIGH (ref 0.44–1.00)
GFR calc Af Amer: 38 mL/min — ABNORMAL LOW (ref >60)
GFR calc non Af Amer: 33 mL/min — ABNORMAL LOW (ref >60)
Glucose, Bld: 110 mg/dL — ABNORMAL HIGH (ref 70–99)
Potassium: 4.5 mmol/L (ref 3.5–5.1)
Sodium: 142 mmol/L (ref 135–145)
Total Bilirubin: 0.9 mg/dL (ref 0.3–1.2)
Total Protein: 4 g/dL — ABNORMAL LOW (ref 6.5–8.1)

## 2019-04-21 LAB — MAGNESIUM: Magnesium: 1.8 mg/dL (ref 1.7–2.4)

## 2019-04-21 LAB — PHOSPHORUS: Phosphorus: 5.4 mg/dL — ABNORMAL HIGH (ref 2.5–4.6)

## 2019-04-21 LAB — PREALBUMIN: Prealbumin: <5 mg/dL — ABNORMAL LOW (ref 18–38)

## 2019-04-21 MED ORDER — HYDROMORPHONE HCL 1 MG/ML IJ SOLN
0.5000 mg | INTRAMUSCULAR | Status: DC | PRN
  Administered 2019-04-21: 1 mg via INTRAVENOUS
  Administered 2019-04-21: 0.5 mg via INTRAVENOUS
  Filled 2019-04-21: qty 1

## 2019-04-21 MED ORDER — NALOXONE HCL 0.4 MG/ML IJ SOLN: 0.4000 mg | INTRAMUSCULAR | Status: DC | PRN

## 2019-04-21 MED ORDER — ORAL CARE MOUTH RINSE
15.0000 mL | OROMUCOSAL | Status: DC
  Administered 2019-04-21 – 2019-04-28 (×39): 15 mL via OROMUCOSAL

## 2019-04-21 MED ORDER — DIPHENHYDRAMINE HCL 12.5 MG/5ML PO ELIX: 12.5000 mg | ORAL_SOLUTION | Freq: Four times a day (QID) | ORAL | Status: DC | PRN

## 2019-04-21 MED ORDER — DEXTROSE IN LACTATED RINGERS 5 % IV SOLN
INTRAVENOUS | Status: DC
  Administered 2019-04-21 – 2019-04-23 (×2): via INTRAVENOUS
  Administered 2019-04-24: 1000 mL via INTRAVENOUS

## 2019-04-21 MED ORDER — DIPHENHYDRAMINE HCL 50 MG/ML IJ SOLN: 12.5000 mg | Freq: Four times a day (QID) | INTRAMUSCULAR | Status: DC | PRN

## 2019-04-21 MED ORDER — HYDROMORPHONE HCL 1 MG/ML IJ SOLN
0.5000 mg | INTRAMUSCULAR | Status: DC | PRN
  Administered 2019-04-21: 1 mg via INTRAVENOUS
  Administered 2019-04-21: 0.5 mg via INTRAVENOUS
  Filled 2019-04-21 (×2): qty 1

## 2019-04-21 MED ORDER — TRAVASOL 10 % IV SOLN
INTRAVENOUS | Status: AC
  Administered 2019-04-21: 18:00:00 via INTRAVENOUS
  Filled 2019-04-21: qty 417.6

## 2019-04-21 MED ORDER — SODIUM CHLORIDE 0.9% FLUSH: 9.0000 mL | INTRAVENOUS | Status: DC | PRN

## 2019-04-21 MED ORDER — HYDROMORPHONE HCL 1 MG/ML IJ SOLN
0.5000 mg | INTRAMUSCULAR | Status: DC | PRN
  Filled 2019-04-21: qty 1

## 2019-04-21 MED ORDER — INSULIN ASPART 100 UNIT/ML ~~LOC~~ SOLN
0.0000 [IU] | SUBCUTANEOUS | Status: DC
  Administered 2019-04-21 – 2019-04-22 (×4): 1 [IU] via SUBCUTANEOUS

## 2019-04-21 MED ORDER — METHOCARBAMOL 1000 MG/10ML IJ SOLN
500.0000 mg | Freq: Four times a day (QID) | INTRAVENOUS | Status: DC | PRN
  Filled 2019-04-21 (×2): qty 5

## 2019-04-21 MED ORDER — ACETAMINOPHEN 10 MG/ML IV SOLN
1000.0000 mg | Freq: Four times a day (QID) | INTRAVENOUS | Status: AC
  Administered 2019-04-21 – 2019-04-22 (×4): 1000 mg via INTRAVENOUS
  Filled 2019-04-21 (×4): qty 100

## 2019-04-21 MED ORDER — ONDANSETRON HCL 4 MG/2ML IJ SOLN: 4.0000 mg | Freq: Four times a day (QID) | INTRAMUSCULAR | Status: DC | PRN

## 2019-04-21 MED ORDER — HYDROMORPHONE 1 MG/ML IV SOLN
INTRAVENOUS | Status: DC
  Administered 2019-04-21: 30 mg via INTRAVENOUS
  Administered 2019-04-22: 5.5 mg via INTRAVENOUS
  Administered 2019-04-22: 2 mg via INTRAVENOUS
  Administered 2019-04-22: 4.5 mg via INTRAVENOUS
  Administered 2019-04-22: 1.5 mg via INTRAVENOUS
  Administered 2019-04-22: 2 mg via INTRAVENOUS
  Administered 2019-04-22: 2.5 mg via INTRAVENOUS
  Administered 2019-04-22: 1 mg via INTRAVENOUS
  Administered 2019-04-23: 2 mg via INTRAVENOUS
  Filled 2019-04-21: qty 30

## 2019-04-21 NOTE — Progress Notes (Signed)
Have changed pain regime several times, pt has been reevaluated by MD. Pain under much better control.

## 2019-04-21 NOTE — Progress Notes (Signed)
Initial Nutrition Assessment  RD working remotely.  DOCUMENTATION CODES:   Not applicable  INTERVENTION:   - TPN per Pharmacy  Monitor magnesium, potassium, and phosphorus daily for at least 3 days, MD to replete as needed, as pt is at risk for refeeding syndrome given minimal PO intake since admission.  NUTRITION DIAGNOSIS:   Inadequate oral intake related to altered GI function as evidenced by NPO status.  GOAL:   Patient will meet greater than or equal to 90% of their needs  MONITOR:   Diet advancement, Labs, Weight trends, Skin, I & O's, Other (TPN)  REASON FOR ASSESSMENT:   Consult New TPN/TNA  ASSESSMENT:   28 year old female who presented to the ED on 10/21 with abdominal pain and emesis. PMH of anemia, hypothyroidism, amenorrhea, anorexia, gluten intolerance, s/p open meckels divertivulectomy/appendectomy/partial Ladd's for incomplete bowel rotation in 2011. Pt admitted with SIRS, enteritis, AKI, ileus vs SBO. NG tube placed.   10/24 - NGT d/c, clear liquids 10/26 - NGT replaced 10/27 - s/p ex-lap with ileocecectomy, small bowel resection, drainage of intra-abdominal abscess, partial omentectomy, adhesiolysis x 60 minutes 10/28 - extubated  Plan is for TPN to start today at 30 ml/hr. PICC placed yesterday.  NG tube remains in left nare to low intermittent suction. Per MD note, expecting post-op ileus.  Weight up 29 lbs since first measured admit weight on 10/22. Suspect weight gain related to positive fluid balance. Will utilize weight of 60.4 kg as EDW.  Per RN edema assessment, pt with mild pitting generalized edema and moderate pitting perineal edema.  Medications reviewed and include: IV Protonix, IV abx IVF: D5 in LR @ 75 ml/hr  Labs reviewed: BUN 29, creatinine 2.01, phosphorus 5.4, TG 216, hemoglobin 8.6 CBG's: 101-109 x 24 hours  UOP: 1475 ml x 24 hours NGT: 250 ml x 24 hours RLQ JP drain: 390 ml x 24 hours I/O's: +6.6 L since  admit  NUTRITION - FOCUSED PHYSICAL EXAM:  Unable to complete at this time. RD working remotely.  Diet Order:   Diet Order            Diet NPO time specified Except for: Ice Chips  Diet effective now              EDUCATION NEEDS:   No education needs have been identified at this time  Skin:  Skin Assessment: Skin Integrity Issues: Skin Integrity Issues: Incisions: abdomen  Last BM:  04/19/19  Height:   Ht Readings from Last 1 Encounters:  04/20/19 5\' 6"  (1.676 m)    Weight:   Wt Readings from Last 1 Encounters:  04/20/19 73.5 kg    Ideal Body Weight:  59.1 kg  BMI:  Body mass index is 26.15 kg/m.  Estimated Nutritional Needs:   Kcal:  1900-2100  Protein:  90-110 grams  Fluid:  >/= 1.8 L    Kim Face, MS, RD, LDN Inpatient Clinical Dietitian Pager: [phone] Weekend/After Hours: [phone]

## 2019-04-21 NOTE — Progress Notes (Signed)
PHARMACY - ADULT TOTAL PARENTERAL NUTRITION CONSULT NOTE   Pharmacy Consult for TPN Indication: SBO  Patient Measurements: Height: 5\' 6"  (167.6 cm) Weight: 162 lb 0.6 oz (73.5 kg) IBW/kg (Calculated) : 59.3 TPN AdjBW (KG): 60.4 Body mass index is 26.15 kg/m. Usual Weight: TBD  Assessment: 34 YOF presenting epigastric abd pain, nausea and vomiting.  Distention despite having semi-solid stool and tolerated clear liquid diet on 10/26, however CT shows SBO and for exlap 10/27> found sbo (intraabdominal abscesses), had small bowel recestion + drainage of abscesses and partial omentectomy.  Resolving hx of anorexia, has gained weight since this summer.  Albumin low this admission, at risk for refeeding syndrome.    GI: NG output 300>215ml, now s/p surgery. Endo: No hx of DM, CBGs 86-125 Insulin requirements in the past 24 hours: n/a Lytes: K 4.5, CO2 18, Cl 118, CoCa ok, phos 5.4, Mg 1.8 Renal: SCr 3.83>2.01 AKI improving, BUN to 29, UOP 0.8 ml/kg/hr - D5W @75  Pulm: Vent @30  > extubated Cards: Tachy, SBP ok Hepatobil: Mild AST/ALT elevation, Tbili 0.9 (pending hepatitis workup, SBP r/o, some ascities), TG 216 Neuro: Fentanyl + prop turned off CPOT 0, GCS 12 ID: Zosyn for intra-abdominal inf   TPN Access: 04/20/19 TPN start date: 04/21/19 Nutritional Goals (per RD recs 10/28): KCal: 1900-2100 Protein: 90-110g Fluid: >1.8L  Goal TPN rate is 70 ml/hr  Current Nutrition:  NPO Prop overnight, now gtt off  Plan:  Start TPN at 30 mL/hr  Hold lipid for the first 7 days of TPN in ICU patient per ASPEN/SCCM guidelines.  Start date 04/27/19 unless significant hyperglycemia d/t CHO content, or transfer from ICU TPN will provide 42g AA and 158g CHO for a total of 705 kCal, meeting ~ 40% of patient's needs Electrolytes in TPN: standard with TPN start, Cl:Ac 1:2 Daily multivitamin in TPN SSI sensitive q 6h with TPN start  Decrease IVF to [facility] at TPN start TPN labs in AM  Bertis Ruddy,  PharmD Clinical Pharmacist Please check AMION for all [place] numbers 04/21/2019 7:17 AM
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
## 2019-04-21 NOTE — Anesthesia Postprocedure Evaluation (Signed)
Anesthesia Post Note  Patient: Kim Myers  Procedure(s) Performed: EXPLORATORY LAPAROTOMY,  SMALL BOWEL RESECTION, partial omentectomy, drainage of abdominal abscess (N/A Abdomen) Lysis Of Adhesion (Abdomen) Ileocecetomy (Abdomen)     Patient location during evaluation: SICU Anesthesia Type: General Level of consciousness: sedated Pain management: pain level controlled Vital Signs Assessment: post-procedure vital signs reviewed and stable Respiratory status: patient remains intubated per anesthesia plan Cardiovascular status: stable Postop Assessment: no apparent nausea or vomiting Anesthetic complications: no    Last Vitals:  Vitals:   04/21/19 0800 04/21/19 0841  BP: (!) 121/51 125/64  Pulse: (!) 103   Resp: 16   Temp: 37.3 C   SpO2: 98%     Last Pain:  Vitals:   04/21/19 0800  TempSrc: Axillary  PainSc:                  [place]

## 2019-04-21 NOTE — Procedures (Signed)
Extubation Procedure Note  Patient Details:   Name: Kim Myers DOB: 04-24-1991 MRN: [id number]   Airway Documentation:    Vent end date: 04/21/19 Vent end time: 0841   Evaluation  O2 sats: stable throughout Complications: No apparent complications Patient did tolerate procedure well. Bilateral Breath Sounds: Clear   Yes   Patient extubated to [name] per MD order. Tolerated well. Able to vocalize and clear secretions. RT to monitor.  Saunders Glance 04/21/2019, 8:41 AM

## 2019-04-21 NOTE — Progress Notes (Signed)
  Date: 04/21/2019  Patient name: Kim Myers  Medical record number: [id number]  Date of birth: [birth year]        Patient seen this morning on rounds, still in ICU but now extubated.   On exam she is A&O, reports feeling significantly better. NGT in place, abdominal dressing dry and intact. Abdominal distention is much improved. She remains tachycardic but afebrile and hemodynamically stable.   Assessment:   Patient is POD1 s/p ex lap for SBO after failing conservative management. Found to have necrotic small bowel requiring resection, volvulus, and abscesses requiring drainage. Now on IV Zosyn and fluconazole. PICC line has been placed for TPN to start today. Keep NPO except chips and sips with meds per surgery. Appreciate surgery and PCCM assistance.   Will transfer back to progressive today.    Velna Ochs, MD 04/21/2019, 12:13 PM

## 2019-04-21 NOTE — Progress Notes (Signed)
NAME:  Kim Myers, MRN:  [id number], DOB:  Nov 01, 1990, LOS: 7 ADMISSION DATE:  04/14/2019, CONSULTATION DATE:  04/20/19  REFERRING MD: Dr Cliffton Asters , CHIEF COMPLAINT:  Post operative resp insuff.   Brief History   28 yo presented with constipation found to have sbo that ultimately warranted ex lap. Post operative resp insufficiency and we have been asked to consult for vent management.     Past Medical History   Past Medical History:  Diagnosis Date  . Amenorrhea   . Anemia   . Blood transfusion without reported diagnosis   . Gluten intolerance   . History of anemia    resolved with dietary changes  . Hypothyroidism (acquired) 03/25/2018    Significant Hospital Events:  10/27: OR for ex lap with small bowel resection 10/28 stable overnight.  Passed spontaneous breathing trial, extubated Consults:  10/21 surgery 10/22 nephro 10/27 CCM  Procedures:  10/27 ex lap  Significant Diagnostic Tests:  Ct abd/pelvis 10/21: 1. Diffusely dilated air-filled loops of small bowel throughout the abdomen may represent enteritis with associated ileus versus small-bowel obstruction. CT with oral contrast or small-bowel series may provide better evaluation. 2. Small to moderate ascites. 3. No hydronephrosis or nephrolithiasis. Renal u/s 10/21: 1. Negative for obstructive uropathy. 2. Slightly increased renal cortical echogenicity of the right kidney, which could reflect medical renal disease. 3. Left kidney is asymmetrically large relative to the right suggesting compensatory hypertrophy. 4. Small to moderate volume ascites. Ct abd/pelvis 10/26: Significantly worsening small bowel dilatation is noted most consistent with distal small bowel obstruction.Mild anasarca is noted.  Minimal ascites is noted.Small left pleural effusion is noted with adjacent subsegmental atelectasis.  Micro Data:  10/21 urine: neg 10/21 blood: ngtd 10/21 sars2: neg  Antimicrobials:  Ceftriaxone 10/21  Levofloxacin 10/22->10/24 Zosyn 10/26-> Fluconazole 10/27>>> Interim history/subjective:  Looks comfortable.  Anxious to come off ventilator.  Passing spontaneous breathing trial  Objective   Blood pressure 125/64, pulse (Abnormal) 103, temperature 98.1 F (36.7 C), temperature source Axillary, resp. rate 16, height 5\' 6"  (1.676 m), weight 73.5 kg, SpO2 98 %.    Vent Mode: PRVC FiO2 (%):  [30 %-40 %] 30 % Set Rate:  [16 bmp] 16 bmp Vt Set:  [470 mL] 470 mL PEEP:  [5 cmH20] 5 cmH20 Plateau Pressure:  [13 cmH20-16 cmH20] 15 cmH20   Intake/Output Summary (Last 24 hours) at 04/21/2019 0913 Last data filed at 04/21/2019 0600 Gross per 24 hour  Intake 4731.46 ml  Output 4415 ml  Net 316.46 ml   Filed Weights   04/17/19 0417 04/19/19 0429 04/20/19 0316  Weight: 61.9 kg 69.2 kg 73.5 kg    Examination: General 28 year old white female resting in bed looks very comfortable on pressure support ventilation she is anxious to get the ventilator removed HEENT: Normocephalic atraumatic no jugular venous distention appreciated sclera slightly icteric mucous membranes moist orally intubated no JVD Pulmonary: Clear to auscultation without accessory use excellent tidal volume on pressure support of 5 no accessory use appreciated Cardiac: Mildly tachycardic but no murmur rub or gallop Abdomen soft.  Tender to palpation.  Dressing clean dry and intact.  JP drain with cloudy output Extremities generalized edema brisk cap refill strong pulses Neuro awake oriented no focal deficits  Resolved Hospital Problem list     Assessment & Plan:  Post operative resp insufficiency:  Portable chest x-ray personally reviewed without acute infiltrates Placed on spontaneous breathing trial, excellent frequency tidal volume ratio Plan Discontinue sedating drips Extubate 26  spirometry Pulse oximetry Wean oxygen  sbo with volvulus s/p exlap with resection:  Intra-abdominal abcesses:  -JP  drain still putting out serous cloudy output Plan Day #2 fluconazole, will complete total 5 days Day number 3 Zosyn, course to be determined Continue to trend fever and white blood cell curve  Aki:  Slight bump in serum creatinine since yesterday.  She is making excellent urine Plan We will continue maintenance IV fluids Renal dose medications Strict intake and output A.m. chemistry  Fluid and electrolyte imbalance: Hyperchloremia with nonanion gap metabolic acidosis Plan Discontinue sodium chloride Continue maintenance IV fluids with lactated Ringer's A.m. chemistry  Elevated CK:  -Improved Plan We will discontinue trending  Transaminitis:  -This is improving Plan Intermittent following  Acute on chronic normocytic anemia:  Her hemoglobin has drifted since surgery.  Suspect some of this is hemodilution.  There is no evidence of active bleeding currently  plan Trend CBC Okay to continue subcutaneous heparin Transfuse for hemoglobin less than 7 or hemodynamic instability with active bleeding  Best practice:  Diet: npo Pain/Anxiety/Delirium protocol (if indicated): yes VAP protocol (if indicated): yes, discontinued 10/28 DVT prophylaxis: Subcutaneous heparin GI prophylaxis: ppi Glucose control: monitor Mobility: bedrest, mobilize/out of bed 10/28 Code Status: full Family Communication: per primary Disposition: Extubate, mobilize, continue antibiotics.  Diet wound care as directed by surgical services.  I will reevaluate this afternoon to ensure she is continuing to improve.  Critical care likely sign off later today.   Critical care time: my cct 34 minutes.    Erick Colace ACNP-BC [place] Pager # [phone] OR # [phone] if no answer

## 2019-04-21 NOTE — Progress Notes (Signed)
Pt shivereing, looks sicker than one hr ago. She c/o lots of pain. Pain med given. Temp WNL. MDs CCM and CCS paged. All VSS.

## 2019-04-22 DIAGNOSIS — K651 Peritoneal abscess: Secondary | ICD-10-CM

## 2019-04-22 LAB — PHOSPHORUS: Phosphorus: 2.9 mg/dL (ref 2.5–4.6)

## 2019-04-22 LAB — CBC
HCT: 25 % — ABNORMAL LOW (ref 36.0–46.0)
Hemoglobin: 8.1 g/dL — ABNORMAL LOW (ref 12.0–15.0)
MCH: 28.6 pg (ref 26.0–34.0)
MCHC: 32.4 g/dL (ref 30.0–36.0)
MCV: 88.3 fL (ref 80.0–100.0)
Platelets: 429 10*3/uL — ABNORMAL HIGH (ref 150–400)
RBC: 2.83 MIL/uL — ABNORMAL LOW (ref 3.87–5.11)
RDW: 15.2 % (ref 11.5–15.5)
WBC: 15.7 10*3/uL — ABNORMAL HIGH (ref 4.0–10.5)
nRBC: 0.1 % (ref 0.0–0.2)

## 2019-04-22 LAB — GLUCOSE, CAPILLARY
Glucose-Capillary: 114 mg/dL — ABNORMAL HIGH (ref 70–99)
Glucose-Capillary: 116 mg/dL — ABNORMAL HIGH (ref 70–99)
Glucose-Capillary: 128 mg/dL — ABNORMAL HIGH (ref 70–99)
Glucose-Capillary: 131 mg/dL — ABNORMAL HIGH (ref 70–99)
Glucose-Capillary: 132 mg/dL — ABNORMAL HIGH (ref 70–99)
Glucose-Capillary: 148 mg/dL — ABNORMAL HIGH (ref 70–99)

## 2019-04-22 LAB — COMPREHENSIVE METABOLIC PANEL
ALT: 46 U/L — ABNORMAL HIGH (ref 0–44)
AST: 79 U/L — ABNORMAL HIGH (ref 15–41)
Albumin: 1.4 g/dL — ABNORMAL LOW (ref 3.5–5.0)
Alkaline Phosphatase: 58 U/L (ref 38–126)
Anion gap: 5 (ref 5–15)
BUN: 23 mg/dL — ABNORMAL HIGH (ref 6–20)
CO2: 23 mmol/L (ref 22–32)
Calcium: 7.4 mg/dL — ABNORMAL LOW (ref 8.9–10.3)
Chloride: 115 mmol/L — ABNORMAL HIGH (ref 98–111)
Creatinine, Ser: 1.39 mg/dL — ABNORMAL HIGH (ref 0.44–1.00)
GFR calc Af Amer: 60 mL/min — ABNORMAL LOW (ref >60)
GFR calc non Af Amer: 51 mL/min — ABNORMAL LOW (ref >60)
Glucose, Bld: 151 mg/dL — ABNORMAL HIGH (ref 70–99)
Potassium: 4.2 mmol/L (ref 3.5–5.1)
Sodium: 143 mmol/L (ref 135–145)
Total Bilirubin: 0.4 mg/dL (ref 0.3–1.2)
Total Protein: 3.9 g/dL — ABNORMAL LOW (ref 6.5–8.1)

## 2019-04-22 LAB — SURGICAL PATHOLOGY

## 2019-04-22 LAB — MAGNESIUM: Magnesium: 2 mg/dL (ref 1.7–2.4)

## 2019-04-22 MED ORDER — INSULIN ASPART 100 UNIT/ML ~~LOC~~ SOLN
0.0000 [IU] | Freq: Four times a day (QID) | SUBCUTANEOUS | Status: DC
  Administered 2019-04-22 – 2019-04-25 (×6): 1 [IU] via SUBCUTANEOUS

## 2019-04-22 MED ORDER — TRAVASOL 10 % IV SOLN
INTRAVENOUS | Status: AC
  Administered 2019-04-22: 17:00:00 via INTRAVENOUS
  Filled 2019-04-22: qty 696

## 2019-04-22 NOTE — Progress Notes (Signed)
Subjective: Patient was seen this morning on rounds.  She states that her pain has been well controlled since switching to the PCA pump.  She denies having gas at this time.  States that she is able to breathe better since having surgery.  Denies chest pain, shortness breath, abdominal pain, nausea, vomiting.  Objective:  Vital signs in last 24 hours: Vitals:   04/22/19 0000 04/22/19 0010 04/22/19 0356 04/22/19 0400  BP: 118/64     Pulse:      Resp: 20 18 18    Temp: 98.1 F (36.7 C)   97.7 F (36.5 C)  TempSrc: Axillary   Axillary  SpO2: 100% 100% 100%   Weight:      Height:        Physical Exam: Physical Exam  Constitutional: She is oriented to person, place, and time. No distress.  HENT:  Head: Atraumatic.  Eyes: EOM are normal.  Neck: Normal range of motion.  Cardiovascular: Normal rate, regular rhythm, normal heart sounds and intact distal pulses. Exam reveals no gallop and no friction rub.  No murmur heard. Pulmonary/Chest: Effort normal and breath sounds normal. No respiratory distress. She exhibits no tenderness.  Abdominal: She exhibits distension (improved). There is abdominal tenderness.  Musculoskeletal: Normal range of motion.        General: Edema (improving) present. No tenderness.  Neurological: She is alert and oriented to person, place, and time.  Skin: Skin is warm. She is not diaphoretic.       Pertinent labs/Imaging: CBC Latest Ref Rng & Units 04/22/2019 04/21/2019 04/20/2019  WBC 4.0 - 10.5 K/uL 15.7(H) 23.2(H) 29.9(H)  Hemoglobin 12.0 - 15.0 g/dL 8.1(L) 8.6(L) 11.1(L)  Hematocrit 36.0 - 46.0 % 25.0(L) 25.8(L) 33.2(L)  Platelets 150 - 400 K/uL 429(H) 446(H) 461(H)   CMP Latest Ref Rng & Units 04/22/2019 04/21/2019 04/20/2019  Glucose 70 - 99 mg/dL 151(H) 110(H) 105(H)  BUN 6 - 20 mg/dL 23(H) 29(H) 28(H)  Creatinine 0.44 - 1.00 mg/dL 1.39(H) 2.01(H) 1.69(H)  Sodium 135 - 145 mmol/L 143 142 136  Potassium 3.5 - 5.1 mmol/L 4.2 4.5 4.2   Chloride 98 - 111 mmol/L 115(H) 115(H) 111  CO2 22 - 32 mmol/L 23 18(L) 16(L)  Calcium 8.9 - 10.3 mg/dL 7.4(L) 7.0(L) 6.9(L)  Total Protein 6.5 - 8.1 g/dL 3.9(L) 4.0(L) 3.8(L)  Total Bilirubin 0.3 - 1.2 mg/dL 0.4 0.9 1.3(H)  Alkaline Phos 38 - 126 U/L 58 123 48  AST 15 - 41 U/L 79(H) 59(H) 62(H)  ALT 0 - 44 U/L 46(H) 42 51(H)     Assessment/Plan:  Principal Problem:   SBO (small bowel obstruction) (HCC) Active Problems:   Hypothyroidism (acquired)   Amenorrhea   Acute renal injury (HCC)   Elevated TSH   Elevated serum free T4 level   Hyponatremia   Leukocytosis   Anemia   Respiratory failure, acute ([place])    Patient Summary: 28 year old female with a pPMHx of anorexia,amenorrhea(hypothalamic hypogonadism),Hx ofMeckel'sdiverticulum (s/p small bowel resection)who presented with signs symptoms concerning for SBO. Clinical course complicated by AKI and upper GI bleed.  POD#2 and doing well. Pain well controlled with dilaudid PCA pump. Hemodynamically stable with stable Hgb. Leukocytosis is trending downward at 15.7 and renal function improving at a Cr of 1.38 with a GFR of 1.38.   #SBO S/p Ex Lap with ileocecectomy with drainage of intra-abdominal abscess and partial omentectomy. POD#2 and doing well. Day 4 of IV zosyn  - Continue zosyn  #AKI - Cr1.38 today this  AM - MOL07 - Cont LR IVF at 10 cc/h  #GI Bleed #Iron Deficiency Anemia - Resolved  Diet: TPN IVF: LR 10 cc/hr VTE: SCD's Code: Full Code  Dispo: Anticipated discharge pending clinical improvement.   Dellia Cloud, MD 04/22/2019, 5:50 AM Pager: [phone]
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
## 2019-04-22 NOTE — Progress Notes (Signed)
Pharmacy Antibiotic Note  Kim Myers is a 28 y.o. female admitted on 04/14/2019 with UTI vs abdominal infection. Pt was started on levofloxacin with worsening leukocytosis now and fevers overnight. Pharmacy has been consulted for Zosyn dosing with concern for possible abscess.  Open abdomen and now s/p mesh placement 10/28, also on fluconazole IV x5d per surg.  AKI slowly improving, SCr to 1.39  Plan: Continue Zosyn 3.375g IV EI q8h Monitor renal function, surgery plans and LOT  Height: 5\' 6"  (167.6 cm) Weight: 162 lb 0.6 oz (73.5 kg) IBW/kg (Calculated) : 59.3  Temp (24hrs), Avg:98 F (36.7 C), Min:97.7 F (36.5 C), Max:98.5 F (36.9 C)  Recent Labs  Lab 04/15/19 1005  04/19/19 0547 04/20/19 0503 04/20/19 1731 04/21/19 0500 04/22/19 0409  WBC  --    < > 20.3* 21.7* 29.9* 23.2* 15.7*  CREATININE  --    < > 1.90* 1.88* 1.69* 2.01* 1.39*  LATICACIDVEN 0.8  --   --   --   --   --   --    < > = values in this interval not displayed.    Estimated Creatinine Clearance: 61.8 mL/min (A) (by C-G formula based on SCr of 1.39 mg/dL (H)).    Allergies  Allergen Reactions  . Gluten Meal Diarrhea, Nausea And Vomiting and Other (See Comments)    Caused internal bleeding that resulted in hospitalization!!  . Cauliflower [Brassica Oleracea]   . Food     Red Grape Apple   . Peanut-Containing Drug Products     Antimicrobials this admission: Ceftriaxone x1 10/21 Levofloxacin 10/22 >> 10/25 Zosyn 10/26 >> Fluconazole 10/27>>  Microbiology results: 10/26 GI panel: pending 10/21 BCx: negative 10/21 UCx: insig growth  Bertis Ruddy, PharmD Clinical Pharmacist Please check AMION for all [place] numbers 04/22/2019 8:52 AM

## 2019-04-22 NOTE — Progress Notes (Signed)
[place] NOTE   Pharmacy Consult for TPN Indication: SBO  Patient Measurements: Height: 5\' 6"  (167.6 cm) Weight: 162 lb 0.6 oz (73.5 kg) IBW/kg (Calculated) : 59.3 TPN AdjBW (KG): 60.4 Body mass index is 26.15 kg/m. Usual Weight: TBD  Assessment: 62 YOF presenting epigastric abd pain, nausea and vomiting.  Distention despite having semi-solid stool and tolerated clear liquid diet on 10/26, however CT shows SBO and for exlap 10/27> found sbo (intraabdominal abscesses), had small bowel recestion + drainage of abscesses and partial omentectomy.  Resolving hx of anorexia, has gained weight since this summer.  Albumin low this admission, at risk for refeeding syndrome.    GI: NG output 300>768ml, now s/p surgery as above.  LBM 10/26 Prealbumin <5 Endo: No hx of DM, CBGs 110-151 with TPN start Insulin requirements in the past 24 hours: 3 units SSI sensitive Lytes: K 4.2, CO2 23, Cl 115, CoCa ok, phos 5.4>2.9, Mg 2 Renal: SCr 3.83>1.39 AKI improving, BUN to 23, UOP 0.9 ml/kg/hr  Pulm: Vent @30  > extubated Cards: VSS Hepatobil: Mild AST/ALT elevation, Tbili 0.4 (pending hepatitis workup, SBP r/o, some ascities), TG 216 Neuro: Improving off sedation, PCA added ID: Zosyn for intra-abdominal inf   TPN Access: 04/20/19 TPN start date: 04/21/19 Nutritional Goals (per RD recs 10/28): KCal: 1900-2100 Protein: 90-110g Fluid: >1.8L  Goal TPN rate is 70 ml/hr  Current Nutrition:  NPO, TPN  Plan:  Increase TPN to 50 mL/hr - cautious increase with refeeding risk Hold lipid for the first 7 days of TPN in ICU patient per ASPEN/SCCM guidelines.  Start date 04/27/19 unless significant hyperglycemia d/t CHO content, or transfer from ICU TPN will provide 70g AA and 264g CHO for a total of  1176kCal, meeting ~60 % of patient's needs Electrolytes in TPN: Inc phos, others with rate increase, Cl:Ac 1:2 Daily multivitamin, trace elements in TPN Continue SSI  sensitive q 6h - adjust as needed IVF to [place] in AM  Bertis Ruddy, PharmD Clinical Pharmacist Please check AMION for all [place] numbers 04/22/2019 7:08 AM

## 2019-04-23 LAB — GLUCOSE, CAPILLARY
Glucose-Capillary: 136 mg/dL — ABNORMAL HIGH (ref 70–99)
Glucose-Capillary: 140 mg/dL — ABNORMAL HIGH (ref 70–99)
Glucose-Capillary: 125 mg/dL — ABNORMAL HIGH (ref 70–99)
Glucose-Capillary: 101 mg/dL — ABNORMAL HIGH (ref 70–99)

## 2019-04-23 LAB — CBC
HCT: 27 % — ABNORMAL LOW (ref 36.0–46.0)
Hemoglobin: 8.6 g/dL — ABNORMAL LOW (ref 12.0–15.0)
MCH: 27.9 pg (ref 26.0–34.0)
MCHC: 31.9 g/dL (ref 30.0–36.0)
MCV: 87.7 fL (ref 80.0–100.0)
Platelets: 633 10*3/uL — ABNORMAL HIGH (ref 150–400)
RBC: 3.08 MIL/uL — ABNORMAL LOW (ref 3.87–5.11)
RDW: 14.9 % (ref 11.5–15.5)
WBC: 14.7 10*3/uL — ABNORMAL HIGH (ref 4.0–10.5)
nRBC: 0 % (ref 0.0–0.2)

## 2019-04-23 LAB — POCT I-STAT 7, (LYTES, BLD GAS, ICA,H+H)
Acid-base deficit: 10 mmol/L — ABNORMAL HIGH (ref 0.0–2.0)
Bicarbonate: 18.1 mmol/L — ABNORMAL LOW (ref 20.0–28.0)
Calcium, Ion: 1.08 mmol/L — ABNORMAL LOW (ref 1.15–1.40)
HCT: 33 % — ABNORMAL LOW (ref 36.0–46.0)
Hemoglobin: 11.2 g/dL — ABNORMAL LOW (ref 12.0–15.0)
O2 Saturation: 99 %
Patient temperature: 37.2
Potassium: 4.1 mmol/L (ref 3.5–5.1)
Sodium: 141 mmol/L (ref 135–145)
TCO2: 20 mmol/L — ABNORMAL LOW (ref 22–32)
pCO2 arterial: 47.3 mmHg (ref 32.0–48.0)
pH, Arterial: 7.193 — CL (ref 7.350–7.450)
pO2, Arterial: 143 mmHg — ABNORMAL HIGH (ref 83.0–108.0)

## 2019-04-23 LAB — BASIC METABOLIC PANEL
Anion gap: 7 (ref 5–15)
BUN: 19 mg/dL (ref 6–20)
CO2: 25 mmol/L (ref 22–32)
Calcium: 7.8 mg/dL — ABNORMAL LOW (ref 8.9–10.3)
Chloride: 112 mmol/L — ABNORMAL HIGH (ref 98–111)
Creatinine, Ser: 1.31 mg/dL — ABNORMAL HIGH (ref 0.44–1.00)
GFR calc Af Amer: >60 mL/min (ref >60)
GFR calc non Af Amer: 55 mL/min — ABNORMAL LOW (ref >60)
Glucose, Bld: 169 mg/dL — ABNORMAL HIGH (ref 70–99)
Potassium: 3.9 mmol/L (ref 3.5–5.1)
Sodium: 144 mmol/L (ref 135–145)

## 2019-04-23 LAB — TRIGLYCERIDES: Triglycerides: 179 mg/dL — ABNORMAL HIGH (ref <150)

## 2019-04-23 LAB — PHOSPHORUS: Phosphorus: 2.6 mg/dL (ref 2.5–4.6)

## 2019-04-23 MED ORDER — HYDROMORPHONE HCL 1 MG/ML IJ SOLN: 1.0000 mg | INTRAMUSCULAR | Status: DC | PRN

## 2019-04-23 MED ORDER — SODIUM CHLORIDE 0.9% FLUSH: 9.0000 mL | INTRAVENOUS | Status: DC | PRN

## 2019-04-23 MED ORDER — DIPHENHYDRAMINE HCL 50 MG/ML IJ SOLN: 12.5000 mg | Freq: Four times a day (QID) | INTRAMUSCULAR | Status: DC | PRN

## 2019-04-23 MED ORDER — NALOXONE HCL 0.4 MG/ML IJ SOLN: 0.4000 mg | INTRAMUSCULAR | Status: DC | PRN

## 2019-04-23 MED ORDER — TRAVASOL 10 % IV SOLN
INTRAVENOUS | Status: AC
  Administered 2019-04-23: 17:00:00 via INTRAVENOUS
  Filled 2019-04-23: qty 835.2

## 2019-04-23 MED ORDER — HYDROMORPHONE 1 MG/ML IV SOLN
INTRAVENOUS | Status: DC
  Administered 2019-04-23: 30 mg via INTRAVENOUS
  Administered 2019-04-24: 3.6 mg via INTRAVENOUS
  Administered 2019-04-24: 30 mg via INTRAVENOUS
  Administered 2019-04-24: 2 mg via INTRAVENOUS
  Administered 2019-04-24: 1 mg via INTRAVENOUS
  Administered 2019-04-25: 6.4 mg via INTRAVENOUS
  Filled 2019-04-23: qty 30

## 2019-04-23 MED ORDER — ONDANSETRON HCL 4 MG/2ML IJ SOLN: 4.0000 mg | Freq: Four times a day (QID) | INTRAMUSCULAR | Status: DC | PRN

## 2019-04-23 MED ORDER — DIPHENHYDRAMINE HCL 12.5 MG/5ML PO ELIX: 12.5000 mg | ORAL_SOLUTION | Freq: Four times a day (QID) | ORAL | Status: DC | PRN

## 2019-04-23 MED ORDER — SODIUM CHLORIDE 0.9 % IV SOLN
INTRAVENOUS | Status: DC | PRN
  Administered 2019-04-23: 250 mL via INTRAVENOUS

## 2019-04-23 NOTE — Progress Notes (Signed)
PHARMACY - ADULT TOTAL PARENTERAL NUTRITION CONSULT NOTE   Pharmacy Consult for TPN Indication: SBO  Patient Measurements: Height: 5\' 6"  (167.6 cm) Weight: 162 lb 0.6 oz (73.5 kg) IBW/kg (Calculated) : 59.3 TPN AdjBW (KG): 60.4 Body mass index is 26.15 kg/m. Usual Weight: TBD  Assessment: 28 YOF presenting epigastric abd pain, nausea and vomiting.  Distention despite having semi-solid stool and tolerated clear liquid diet on 10/26, however CT shows SBO and for exlap 10/27> found sbo (intraabdominal abscesses), had small bowel recestion + drainage of abscesses and partial omentectomy.  Resolving hx of anorexia, has gained weight since this summer.  Albumin low this admission, at risk for refeeding syndrome.    GI: NG output 300>700 > 575 ml, now s/p surgery as above.  LBM 10/26 Prealbumin <5 Endo: No hx of DM, CBGs 130-170  Insulin requirements in the past 24 hours: 3 units SSI sensitive Lytes: K 3.9, CO2 25, Cl 115, CoCa ok, phos 5.4>2.9, Mg 2 Renal: SCr 3.83>1.39 AKI improving, BUN to 23, UOP 0.9 ml/kg/hr  Pulm: Vent @30  > extubated Cards: VSS Hepatobil: AST/ALT 79/46, Tbili 0.4 (pending hepatitis workup, SBP r/o, some ascities), TG 216 Neuro: Improving off sedation, PCA added ID: Zosyn for intra-abdominal inf   TPN Access: 04/20/19 TPN start date: 04/21/19 Nutritional Goals (per RD recs 10/28): KCal: 1900-2100 Protein: 90-110g Fluid: >1.8L  Goal TPN rate is 70 ml/hr  Current Nutrition:  NPO, TPN  Plan:  -Increase TPN to 60 mL/hr, monitor for refeeding risk, can likely increase to goal rate tomorow -TPN will provide 83 g AA, 26 g lipids and 288 g CHO for a total of 1572 kCal, meeting ~80 % of patient's needs -Electrolytes in TPN: standard, Cl:Ac 1:1 -Daily multivitamin, trace elements in TPN -Continue SSI sensitive q 6h - adjust as needed -IVF to [employer] 04/23/2019 8:10 AM

## 2019-04-23 NOTE — Plan of Care (Signed)

## 2019-04-23 NOTE — Progress Notes (Signed)
   04/23/19 0134  MEWS Score  Resp (!) 9  ECG Heart Rate 89  Pulse Rate 90  BP (!) 132/93  Temp 98.1 F (36.7 C)  Level of Consciousness Alert  SpO2 100 %  O2 Device Nasal Cannula  O2 Flow Rate (L/min) 3 L/min  MEWS Score  MEWS RR 1  MEWS Pulse 0  MEWS Systolic 0  MEWS LOC 0  MEWS Temp 0  MEWS Score 1  MEWS Score Color Green  MEWS Assessment  Is this an acute change? No  MEWS Guidelines - (patients age 28 and over)  Red - At High Risk for Deterioration Yellow - At risk for Deterioration  1. Go to room and assess patient 2. Validate data. Is this patient's baseline? If data confirmed: 3. Is this an acute change? 4. Administer prn meds/treatments as ordered. 5. Note Sepsis score 6. Review goals of care 7. Sports coach, RRT nurse and Provider. 8. Ask Provider to come to bedside.  9. Document patient condition/interventions/response. 10. Increase frequency of vital signs and focused assessments to at least q15 minutes x 4, then q30 minutes x2. - If stable, then q1h x3, then q4h x3 and then q8h or dept. routine. - If unstable, contact Provider & RRT nurse. Prepare for possible transfer. 11. Add entry in progress notes using the smart phrase ".MEWS". 1. Go to room and assess patient 2. Validate data. Is this patient's baseline? If data confirmed: 3. Is this an acute change? 4. Administer prn meds/treatments as ordered? 5. Note Sepsis score 6. Review goals of care 7. Sports coach and Provider 8. Call RRT nurse as needed. 9. Document patient condition/interventions/response. 10. Increase frequency of vital signs and focused assessments to at least q2h x2. - If stable, then q4h x2 and then q8h or dept. routine. - If unstable, contact Provider & RRT nurse. Prepare for possible transfer. 11. Add entry in progress notes using the smart phrase ".MEWS".  Green - Likely stable [place] - Comfort Care Only  1. Continue routine/ordered monitoring.  2. Review  goals of care. 1. Continue routine/ordered monitoring. 2. Review goals of care.

## 2019-04-23 NOTE — Progress Notes (Signed)
Subjective: Seen this morning on rounds.  Patient states that she is improved significantly since her surgery.  She states that her abdomen has felt less distended.  Her pain is well controlled.  Patient was able to ambulate a little bit with PT today.  Objective:  Vital signs in last 24 hours: Vitals:   04/23/19 0852 04/23/19 0950 04/23/19 1155 04/23/19 1250  BP: (!) 139/96  (!) 140/96   Pulse: (!) 101     Resp: 14 18 13 18   Temp: 97.7 F (36.5 C)  98.2 F (36.8 C)   TempSrc: Oral  Oral   SpO2: 100% 96%  98%  Weight:      Height:        Physical Exam: Physical Exam  Constitutional: She is oriented to person, place, and time. No distress.  HENT:  Head: Atraumatic.  Eyes: EOM are normal.  Neck: Normal range of motion.  Cardiovascular: Normal rate, regular rhythm, normal heart sounds and intact distal pulses. Exam reveals no gallop and no friction rub.  No murmur heard. Pulmonary/Chest: Effort normal and breath sounds normal. No respiratory distress. She exhibits no tenderness.  Abdominal: Soft. She exhibits distension. There is abdominal tenderness.  Musculoskeletal: Normal range of motion.        General: No tenderness or edema.  Neurological: She is alert and oriented to person, place, and time.  Skin: Skin is warm and dry. She is not diaphoretic.       Pertinent labs/Imaging: CBC Latest Ref Rng & Units 04/23/2019 04/22/2019 04/21/2019  WBC 4.0 - 10.5 K/uL 14.7(H) 15.7(H) 23.2(H)  Hemoglobin 12.0 - 15.0 g/dL 04/23/2019) 8.1(L) 8.6(L)  Hematocrit 36.0 - 46.0 % 27.0(L) 25.0(L) 25.8(L)  Platelets 150 - 400 K/uL 633(H) 429(H) 446(H)   CMP Latest Ref Rng & Units 04/23/2019 04/22/2019 04/21/2019  Glucose 70 - 99 mg/dL 04/23/2019) 409(B) 353(G)  BUN 6 - 20 mg/dL 19 992(E) 26(S)  Creatinine 0.44 - 1.00 mg/dL 34(H) 9.62(I) 2.97(L)  Sodium 135 - 145 mmol/L 144 143 142  Potassium 3.5 - 5.1 mmol/L 3.9 4.2 4.5  Chloride 98 - 111 mmol/L 112(H) 115(H) 115(H)  CO2 22 - 32 mmol/L 25  23 18(L)  Calcium 8.9 - 10.3 mg/dL 7.8(L) 7.4(L) 7.0(L)  Total Protein 6.5 - 8.1 g/dL - 3.9(L) 4.0(L)  Total Bilirubin 0.3 - 1.2 mg/dL - 0.4 0.9  Alkaline Phos 38 - 126 U/L - 58 123  AST 15 - 41 U/L - 79(H) 59(H)  ALT 0 - 44 U/L - 46(H) 42      Assessment/Plan:  Principal Problem:   SBO (small bowel obstruction) (HCC) Active Problems:   Hypothyroidism (acquired)   Amenorrhea   Acute renal injury (HCC)   Elevated TSH   Elevated serum free T4 level   Hyponatremia   Leukocytosis   Anemia   Respiratory failure, acute (HCC)    Patient Summary: 28 year old female with a pPMHx of anorexia,amenorrhea(hypothalamic hypogonadism),Hx ofMeckel'sdiverticulum (s/p small bowel resection)who presented with signs symptoms concerning for SBO. Clinical course complicated byAKI andupper GI bleed.  POD#3 and doing well. Pain well controlled with dilaudid PCA pump. Hemodynamically stable with stable Hgb. Leukocytosis is trending downward at 14.7 and renal function improving at a Cr of 1.38 with a GFR of 55.   #SBO S/p Ex Lap with ileocecectomy with drainage of intra-abdominal abscess and partial omentectomy. POD#2 and doing well. Day 5 of IV zosyn  - Continue zosyn we will need to check with Surgery regarding length of antibiotics  -  Continue daily lads - OOB to chair with nurse, ambulate when possible - PICC/TPN unknown duration - bowel rest  #AKI - Cr1.38today this AM - GFR55 - Cont LR IVF at 10 cc/h  #GI Bleed #Iron Deficiency Anemia - Resolved  Diet: TPN IVF: LR VTE:  Code: Heparin  Dispo: Anticipated discharge pending clinical improvment.   Marianna Payment, MD 04/23/2019, 4:12 PM Pager: [phone]

## 2019-04-23 NOTE — Progress Notes (Signed)
G Tube and Foley Catheter removed per order. Pt given water to sip. Will monitor toleration of clear liquid PO intake. Pt stood at bedside w/o assistance. Overall disposition positive.

## 2019-04-23 NOTE — Progress Notes (Signed)
Kim Myers was transferred to 5w14 from 4N ICU via bed.  The patient ambulated to her new bed with assistance without incident.  The patient is alert and oriented x4.  Bed is in the lowest position, bed alarm activated.  Call bell and telephone are within reach.  Explained to patient how to use call bell and telephone, patient indicated understanding.  Patient denies any further needs at this time.
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
## 2019-04-23 NOTE — Progress Notes (Signed)
Pharmacy Antibiotic Note  Kim Myers is a 28 y.o. female admitted on 04/14/2019 with acute cystitis, SBO.  Pharmacy has been consulted for Zosyn dosing. ID: Afebrile. WBC 14.7 down. Scr 1.31 down.  CV: UTI vs abd infex  Ceftriaxone x1 10/21 Levofloxacin 10/22 >> 10/25 Zosyn 10/26 >> Fluconazole 10/27>> (11/1)  10/26 GI panel: negative 10/21 BCx: negative 10/21 UCx: insig growth   Plan: Zosyn 3.375g IV q 8 hrs. Pharmacy will sign off. Please reconsult for further dosing assitance.    Height: 5\' 6"  (167.6 cm) Weight: 162 lb 0.6 oz (73.5 kg) IBW/kg (Calculated) : 59.3  Temp (24hrs), Avg:97.9 F (36.6 C), Min:97.7 F (36.5 C), Max:98.3 F (36.8 C)  Recent Labs  Lab 04/20/19 0503 04/20/19 1731 04/21/19 0500 04/22/19 0409 04/23/19 0257 04/23/19 0631  WBC 21.7* 29.9* 23.2* 15.7*  --  14.7*  CREATININE 1.88* 1.69* 2.01* 1.39* 1.31*  --     Estimated Creatinine Clearance: 65.6 mL/min (A) (by C-G formula based on SCr of 1.31 mg/dL (H)).    Allergies  Allergen Reactions  . Gluten Meal Diarrhea, Nausea And Vomiting and Other (See Comments)    Caused internal bleeding that resulted in hospitalization!!  . Cauliflower [Brassica Oleracea]   . Food     Red Grape Apple   . Peanut-Containing Drug Products    Kwamane Whack S. Alford Highland, PharmD, BCPS Clinical Staff Pharmacist Eilene Ghazi Stillinger 04/23/2019 9:10 AM

## 2019-04-24 DIAGNOSIS — K55029 Acute infarction of small intestine, extent unspecified: Secondary | ICD-10-CM

## 2019-04-24 LAB — BASIC METABOLIC PANEL
Anion gap: 10 (ref 5–15)
BUN: 17 mg/dL (ref 6–20)
CO2: 27 mmol/L (ref 22–32)
Calcium: 7.8 mg/dL — ABNORMAL LOW (ref 8.9–10.3)
Chloride: 101 mmol/L (ref 98–111)
Creatinine, Ser: 1.23 mg/dL — ABNORMAL HIGH (ref 0.44–1.00)
GFR calc Af Amer: >60 mL/min (ref >60)
GFR calc non Af Amer: 60 mL/min — ABNORMAL LOW (ref >60)
Glucose, Bld: 132 mg/dL — ABNORMAL HIGH (ref 70–99)
Potassium: 3.6 mmol/L (ref 3.5–5.1)
Sodium: 138 mmol/L (ref 135–145)

## 2019-04-24 LAB — PHOSPHORUS: Phosphorus: 2.6 mg/dL (ref 2.5–4.6)

## 2019-04-24 LAB — CBC
HCT: 26.6 % — ABNORMAL LOW (ref 36.0–46.0)
Hemoglobin: 8.6 g/dL — ABNORMAL LOW (ref 12.0–15.0)
MCH: 27.7 pg (ref 26.0–34.0)
MCHC: 32.3 g/dL (ref 30.0–36.0)
MCV: 85.5 fL (ref 80.0–100.0)
Platelets: 707 10*3/uL — ABNORMAL HIGH (ref 150–400)
RBC: 3.11 MIL/uL — ABNORMAL LOW (ref 3.87–5.11)
RDW: 14.6 % (ref 11.5–15.5)
WBC: 16.6 10*3/uL — ABNORMAL HIGH (ref 4.0–10.5)
nRBC: 0 % (ref 0.0–0.2)

## 2019-04-24 LAB — GLUCOSE, CAPILLARY
Glucose-Capillary: 111 mg/dL — ABNORMAL HIGH (ref 70–99)
Glucose-Capillary: 123 mg/dL — ABNORMAL HIGH (ref 70–99)
Glucose-Capillary: 128 mg/dL — ABNORMAL HIGH (ref 70–99)
Glucose-Capillary: 86 mg/dL (ref 70–99)
Glucose-Capillary: 95 mg/dL (ref 70–99)

## 2019-04-24 LAB — MAGNESIUM: Magnesium: 1.5 mg/dL — ABNORMAL LOW (ref 1.7–2.4)

## 2019-04-24 MED ORDER — TRAVASOL 10 % IV SOLN
INTRAVENOUS | Status: AC
  Administered 2019-04-24: 18:00:00 via INTRAVENOUS
  Filled 2019-04-24: qty 1002.24

## 2019-04-24 NOTE — Progress Notes (Signed)
Patient able to void 350 ml; after voiding, at bladder scan there were 525 ml residue. MD notified. Will continue to monitor.

## 2019-04-24 NOTE — Progress Notes (Signed)
   Subjective: Patient seen and evaluated at bedside on morning rounds. She is doing well on clear liquids. No vomiting since NG tube removal. Still having a fair amount of pain and uses the PCA pump as often as she is able. Passing flatus, no BM. She feels like she is swollen everywhere. She has been able to pass urine on her own since removing foley yesterday.   Objective:  Vital signs in last 24 hours: Vitals:   04/24/19 0424 04/24/19 0431 04/24/19 0804 04/24/19 0806  BP:   (!) 144/103   Pulse:      Resp:  14 12 19   Temp: 99.3 F (37.4 C)  98.2 F (36.8 C)   TempSrc: Oral  Oral   SpO2:  99% 100% 99%  Weight:      Height:       General: awake, alert, pleasant female sitting up in bed in NAD CV: RRR; no m/r/g Pulm: normal work of breathing; lungs CTAB Abd: hypoactive bowel sounds; soft, mildly distended with appropriate tenderness over incision Ext: trace pitting edema in extremities   Assessment/Plan:  Principal Problem:   SBO (small bowel obstruction) (HCC) Active Problems:   Hypothyroidism (acquired)   Amenorrhea   Acute renal injury (HCC)   Elevated TSH   Elevated serum free T4 level   Hyponatremia   Leukocytosis   Anemia   Respiratory failure, acute ([place])  28 year old female with a pPMHx of anorexia,amenorrhea(hypothalamic hypogonadism),Hx ofMeckel'sdiverticulum (s/p small bowel resection)who presented with SBO underwent surgical intervention after failing conservative management. Found to have necrotic small bowel requiring resection, volvulus, and abscesses requiring drainage.Clinical course also complicated byAKI andupper GI bleed.  SBO S/p Ex Lap with ileocecectomy with drainage of intra-abdominal abscess and partial omentectomy - POD#4; will continue PCA pump another day to ensure good pain control - abdomen is soft, she is passing flatus, no BM yet - continuing IV zosyn for total of 7 days; overall white count trending down    - blood counts have  remained stable post-operatively  - OOB to chair with nurse, ambulate when possible - she is on TPN for nutrition - doing well on clear liquid diet - appreciate general surgery following  AKI - secondary to volume depletion with n/v and poor PO intake  - improving with IVF; on maintenance at 50 cc/hr - continue to trend BMP - having decent urine output since foley removal, but appears slightly volume overloaded on exam and may end up requiring Lasix   #GI Bleed #Iron Deficiency Anemia -Resolved  Dispo: Anticipated discharge pending clinical improvement.   Modena Nunnery D, DO 04/24/2019, 11:37 AM Pager: [phone]

## 2019-04-24 NOTE — Progress Notes (Signed)
[facility] NOTE   Pharmacy Consult for TPN Indication: SBO  Patient Measurements: Height: 5\' 6"  (167.6 cm) Weight: 162 lb 0.6 oz (73.5 kg) IBW/kg (Calculated) : 59.3 TPN AdjBW (KG): 60.4 Body mass index is 26.15 kg/m. Usual Weight: TBD  Assessment: 23 YOF presenting epigastric abd pain, nausea and vomiting.  Distention despite having semi-solid stool and tolerated clear liquid diet on 10/26, however CT shows SBO and for exlap 10/27> found sbo (intraabdominal abscesses), had small bowel recestion + drainage of abscesses and partial omentectomy.  Resolving hx of anorexia, has gained weight since this summer.  Albumin low this admission, at risk for refeeding syndrome.    GI: NG removed 10/30, now s/p surgery as above.  LBM 10/26 Prealbumin <5 Flatus positive, starting clear liquids Endo: No hx of DM, CBGs 95-140  Insulin requirements in the past 24 hours: 2 units SSI sensitive Lytes: K 3.6, Cl 115>101, phos 2.6, Mg 1.5 Renal: SCr 3.83>1.39 AKI improving, BUN to 23, UOP 0.9 ml/kg/hr  Pulm: Vent @30  > extubated Cards: VSS Hepatobil: AST/ALT 79/46, Tbili 0.4 (pending hepatitis workup, SBP r/o, some ascities), TG 216>179 Neuro: Improving off sedation, PCA added ID: Zosyn for intra-abdominal inf   TPN Access: 04/20/19 TPN start date: 04/21/19 Nutritional Goals (per RD recs 10/28): KCal: 1900-2100 Protein: 90-110g Fluid: >1.8L  Goal TPN rate is 70 ml/hr  Current Nutrition:  Clear liquid, TPN  Plan:  Increase TPN to 72 mL/hr TPN will provide  100g AA,  52g lipids and  311g CHO for a total of  1977 kCal, meeting ~100 % of patient's needs Electrolytes in TPN: [place] K/phos/Mg relative to rate increase, Cl:Ac 1:1 Daily multivitamin, trace elements in TPN Continue SSI sensitive q 6h - d/c of no longer needed at goal F/u diet toleration  BMP + phos  in AM  Bertis Ruddy, PharmD Clinical Pharmacist Please check AMION for all [place]  numbers 04/24/2019 7:19 AM

## 2019-04-24 NOTE — Progress Notes (Addendum)
Patient able to void 400 ml; after she voided staff did bladder scan and there were 470 ml left. MD made aware. Will continue to monitor.

## 2019-04-24 NOTE — Progress Notes (Signed)
[facility] Office:  [phone] General Surgery Progress Note   LOS: 10 days  POD -  4 Days Post-Op  Chief Complaint: Abdominal pain  Assessment and Plan: 1.   EXPLORATORY LAPAROTOMY,  SMALL BOWEL RESECTION, partial omentectomy, drainage of abdominal abscess, Lysis Of Adhesion, Ileocecetomy - 04/20/2019 - White  WBC - 16,600 - 04/24/2019  On zosyn/diflucan  On clear liquids - no BM.  Will leave on clears. 2.  Anemia  Hgb - 8.6 - 04/24/2019 3.  Elevated Creatinine  Creat - 1.23 - 04/24/2019 4.  DVT prophylaxis - SQ heparin   Principal Problem:   SBO (small bowel obstruction) (HCC) Active Problems:   Hypothyroidism (acquired)   Amenorrhea   Acute renal injury (HCC)   Elevated TSH   Elevated serum free T4 level   Hyponatremia   Leukocytosis   Anemia   Respiratory failure, acute (HCC)  Subjective:  Doing well.  No specific complaint.  Mother at bedside.  Objective:   Vitals:   04/24/19 1201 04/24/19 1203  BP:  (!) 136/99  Pulse:    Resp: 13 13  Temp:  98.2 F (36.8 C)  SpO2: 97% 97%     Intake/Output from previous day:  10/30 0701 - 10/31 0700 In: 3889.9 [P.O.:720; I.V.:2899.3; IV Piggyback:270.6] Out: 500 [Urine:465; Drains:35]  Intake/Output this shift:  Total I/O In: 50 [IV Piggyback:50] Out: 300 [Urine:300]   Physical Exam:   General: WN WF who is alert and oriented.    HEENT: Normal. Pupils equal. .   Lungs: Clear.   Abdomen: Soft.  Has some BS.   Wound: Clean.   Lab Results:    Recent Labs    04/23/19 0631 04/24/19 0816  WBC 14.7* 16.6*  HGB 8.6* 8.6*  HCT 27.0* 26.6*  PLT 633* 707*    BMET   Recent Labs    04/23/19 0257 04/24/19 0816  NA 144 138  K 3.9 3.6  CL 112* 101  CO2 25 27  GLUCOSE 169* 132*  BUN 19 17  CREATININE 1.31* 1.23*  CALCIUM 7.8* 7.8*    PT/INR  No results for input(s): LABPROT, INR in the last 72 hours.  ABG  No results for input(s): PHART, HCO3 in the last 72 hours.  Invalid input(s):  PCO2, PO2   Studies/Results:  No results found.   Anti-infectives:   Anti-infectives (From admission, onward)   Start     Dose/Rate Route Frequency Ordered Stop   04/20/19 1430  fluconazole (DIFLUCAN) IVPB 200 mg     200 mg 100 mL/hr over 60 Minutes Intravenous Every 24 hours 04/20/19 1418 04/25/19 1429   04/19/19 1130  piperacillin-tazobactam (ZOSYN) IVPB 3.375 g     3.375 g 12.5 mL/hr over 240 Minutes Intravenous Every 8 hours 04/19/19 1122     04/15/19 1200  levofloxacin (LEVAQUIN) IVPB 750 mg  Status:  Discontinued     750 mg 100 mL/hr over 90 Minutes Intravenous Every 24 hours 04/15/19 1127 04/15/19 1149   04/15/19 1200  levofloxacin (LEVAQUIN) IVPB 750 mg  Status:  Discontinued     750 mg 100 mL/hr over 90 Minutes Intravenous Every 48 hours 04/15/19 1149 04/19/19 1110   04/14/19 1100  cefTRIAXone (ROCEPHIN) 1 g in sodium chloride 0.9 % 100 mL IVPB  Status:  Discontinued     1 g 200 mL/hr over 30 Minutes Intravenous Every 24 hours 04/14/19 1055 04/15/19 1127      Alphonsa Overall, MD, [facility] Office: [phone] 04/24/2019

## 2019-04-24 NOTE — Progress Notes (Signed)
Patient able to ambulate in the hallway approximately 300 feet. HR increased to 138. Any time when patient is moving around her HR is going up. MD aware. Will continue to monitor.

## 2019-04-25 ENCOUNTER — Inpatient Hospital Stay (HOSPITAL_COMMUNITY): Payer: BC Managed Care – PPO

## 2019-04-25 DIAGNOSIS — Z978 Presence of other specified devices: Secondary | ICD-10-CM

## 2019-04-25 DIAGNOSIS — E233 Hypothalamic dysfunction, not elsewhere classified: Secondary | ICD-10-CM

## 2019-04-25 DIAGNOSIS — R509 Fever, unspecified: Secondary | ICD-10-CM

## 2019-04-25 DIAGNOSIS — R000 Tachycardia, unspecified: Secondary | ICD-10-CM

## 2019-04-25 LAB — CBC WITH DIFFERENTIAL/PLATELET
Abs Immature Granulocytes: 1.02 10*3/uL — ABNORMAL HIGH (ref 0.00–0.07)
Basophils Absolute: 0.1 10*3/uL (ref 0.0–0.1)
Basophils Relative: 0 %
Eosinophils Absolute: 0.1 10*3/uL (ref 0.0–0.5)
Eosinophils Relative: 0 %
HCT: 24.8 % — ABNORMAL LOW (ref 36.0–46.0)
Hemoglobin: 7.9 g/dL — ABNORMAL LOW (ref 12.0–15.0)
Immature Granulocytes: 6 %
Lymphocytes Relative: 9 %
Lymphs Abs: 1.5 10*3/uL (ref 0.7–4.0)
MCH: 27.8 pg (ref 26.0–34.0)
MCHC: 31.9 g/dL (ref 30.0–36.0)
MCV: 87.3 fL (ref 80.0–100.0)
Monocytes Absolute: 0.9 10*3/uL (ref 0.1–1.0)
Monocytes Relative: 5 %
Neutro Abs: 13.7 10*3/uL — ABNORMAL HIGH (ref 1.7–7.7)
Neutrophils Relative %: 80 %
Platelets: 766 10*3/uL — ABNORMAL HIGH (ref 150–400)
RBC: 2.84 MIL/uL — ABNORMAL LOW (ref 3.87–5.11)
RDW: 14.6 % (ref 11.5–15.5)
WBC: 17.2 10*3/uL — ABNORMAL HIGH (ref 4.0–10.5)
nRBC: 0 % (ref 0.0–0.2)

## 2019-04-25 LAB — GLUCOSE, CAPILLARY
Glucose-Capillary: 111 mg/dL — ABNORMAL HIGH (ref 70–99)
Glucose-Capillary: 114 mg/dL — ABNORMAL HIGH (ref 70–99)
Glucose-Capillary: 130 mg/dL — ABNORMAL HIGH (ref 70–99)
Glucose-Capillary: 134 mg/dL — ABNORMAL HIGH (ref 70–99)

## 2019-04-25 LAB — BASIC METABOLIC PANEL
Anion gap: 7 (ref 5–15)
BUN: 16 mg/dL (ref 6–20)
CO2: 29 mmol/L (ref 22–32)
Calcium: 7.8 mg/dL — ABNORMAL LOW (ref 8.9–10.3)
Chloride: 101 mmol/L (ref 98–111)
Creatinine, Ser: 1.2 mg/dL — ABNORMAL HIGH (ref 0.44–1.00)
GFR calc Af Amer: >60 mL/min (ref >60)
GFR calc non Af Amer: >60 mL/min (ref >60)
Glucose, Bld: 126 mg/dL — ABNORMAL HIGH (ref 70–99)
Potassium: 3.8 mmol/L (ref 3.5–5.1)
Sodium: 137 mmol/L (ref 135–145)

## 2019-04-25 LAB — PHOSPHORUS: Phosphorus: 3.3 mg/dL (ref 2.5–4.6)

## 2019-04-25 MED ORDER — SODIUM CHLORIDE 0.9 % IV SOLN
INTRAVENOUS | Status: DC
  Administered 2019-04-25: 500 mL via INTRAVENOUS

## 2019-04-25 MED ORDER — IOHEXOL 300 MG/ML  SOLN
100.0000 mL | Freq: Once | INTRAMUSCULAR | Status: AC | PRN
  Administered 2019-04-25: 100 mL via INTRAVENOUS

## 2019-04-25 MED ORDER — HYDROMORPHONE HCL 1 MG/ML IJ SOLN
1.0000 mg | INTRAMUSCULAR | Status: DC | PRN
  Administered 2019-04-25 – 2019-04-28 (×25): 1 mg via INTRAVENOUS
  Filled 2019-04-25 (×25): qty 1

## 2019-04-25 MED ORDER — TRAVASOL 10 % IV SOLN
INTRAVENOUS | Status: AC
  Administered 2019-04-25: 18:00:00 via INTRAVENOUS
  Filled 2019-04-25: qty 1002.24

## 2019-04-25 NOTE — Progress Notes (Signed)
PCA pump D/C per MD order. Will continue to monitor.

## 2019-04-25 NOTE — Progress Notes (Signed)
I & O yielded 500 mL clear yellow urine. Patient tolerated well.

## 2019-04-25 NOTE — Progress Notes (Signed)
MEWS/VS Documentation      04/25/2019 0700 04/25/2019 0743 04/25/2019 0755 04/25/2019 0832   MEWS Score:  2  2  2  3    MEWS Score Color:  Yellow  Yellow  Yellow  Yellow   Resp:  -  14  17  -   BP:  -  -  (!) 135/91  -   Temp:  -  -  100 F (37.8 C)  -   O2 Device:  -  Nasal Cannula  Nasal Cannula  -   O2 Flow Rate (L/min):  -  2 L/min  2 L/min  -    MD aware about patient's condition.

## 2019-04-25 NOTE — Progress Notes (Signed)
Patient urinated 600 mL. Bladder scan done and it read 347 mL. Dr. Aundra Dubin notified and received an order for I & Ot cath x one . Will do and continue to monitor.

## 2019-04-25 NOTE — Progress Notes (Signed)
   Subjective:  Patient resting in bed on exam. Continues to have abdominal pain from surgery, but denies any acute changes in pain. Rates her pain 7/10 and 4-5/10 after receiving PCA dilaudid. Pt reports using PCA as soon as it is available. Reports she feels swollen and has difficulty breathing when lying flat. Deep breathing is limited by pain.   Objective:  Vital signs in last 24 hours: Vitals:   04/25/19 0258 04/25/19 0443 04/25/19 0454 04/25/19 0518  BP: (!) 137/96     Pulse:      Resp: 16 14  10   Temp:   98.7 F (37.1 C)   TempSrc:   Oral   SpO2:    99%  Weight:      Height:       Physical Exam Constitutional:      General: She is not in acute distress.    Appearance: She is well-developed.  Cardiovascular:     Rate and Rhythm: Regular rhythm. Tachycardia present.  Pulmonary:     Effort: No respiratory distress.     Breath sounds: Normal breath sounds. No wheezing or rhonchi.  Abdominal:     General: Bowel sounds are decreased.     Tenderness: There is abdominal tenderness.     Comments: Abdomin is bandage. Drain in place, 30 cc output in last 24hrs.  Neurological:     Mental Status: She is alert.     Assessment/Plan:  Principal Problem:   SBO (small bowel obstruction) (HCC) Active Problems:   Hypothyroidism (acquired)   Amenorrhea   Acute renal injury (HCC)   Elevated TSH   Elevated serum free T4 level   Hyponatremia   Leukocytosis   Anemia   Respiratory failure, acute ([place])  28 year old female with a pPMHx of anorexia,amenorrhea(hypothalamic hypogonadism),Hx ofMeckel'sdiverticulum (s/p small bowel resection)who presented with SBO underwent surgical intervention after failing conservative management. Found to have necrotic small bowel requiring resection, volvulus, and abscesses requiring drainage.Clinical course also complicated byAKI andupper GI bleed.  SBO S/p Ex Lap with ileocecectomy with drainage of intra-abdominal abscess and partial  omentectomy - POD#5; tachycardic , Tmax 99.9. WBC trended down initially, but holding 15-17 over past 4 days. Tachycardic to 140's. Hgb stable. Will consider continuing antibiotics after re-imaging. Abdomen is soft, she is passing flatus, no BM yet - CT Abdomen Pelvis w/ contrast  - discontinue PCA pump  - Start IV Hydromorphone 1 mg q2hrs for sever pain - continuing IV zosyn , on day 7/7 days - day 5/5 of diflucan - OOB to chair with nurse, ambulate when possible - she is on TPN for nutrition - tolerating clear liquid diet, continue - appreciate general surgery following  AKI - secondary to volume depletion with n/v and poor PO intake . Baseline Cr ~.7. Elevated to 3.83 on 10/21 , downtrend and 1.2 today, improved with IV fluids. Patient appears volume overloaded on exam. Will continue to monitor diuresis. Output is 4.4L in last 24 hrs , but patient found to be holding some urine on post void bladder scan ( ~350 ml).  - continue to trend BMP - In/out cath, consider placing foley if patient has another bladder scan >300 ml   #GI Bleed #Iron Deficiency Anemia -Resolved  Dispo: Anticipated discharge pending clinical improvement.   Tamsen Snider, MD PGY1  [phone]

## 2019-04-25 NOTE — Progress Notes (Signed)
PHARMACY - ADULT TOTAL PARENTERAL NUTRITION CONSULT NOTE   Pharmacy Consult for TPN Indication: SBO  Patient Measurements: Height: 5\' 6"  (167.6 cm) Weight: 162 lb 0.6 oz (73.5 kg) IBW/kg (Calculated) : 59.3 TPN AdjBW (KG): 60.4 Body mass index is 26.15 kg/m. Usual Weight: TBD  Assessment: 9 YOF presenting epigastric abd pain, nausea and vomiting.  Distention despite having semi-solid stool and tolerated clear liquid diet on 10/26, however CT shows SBO and for exlap 10/27> found sbo (intraabdominal abscesses), had small bowel recestion + drainage of abscesses and partial omentectomy.  Resolving hx of anorexia, has gained weight since this summer.  Albumin low this admission, at risk for refeeding syndrome.    GI: NG removed 10/30, now s/p surgery as above.  LBM 10/26 Prealbumin <5 Flatus positive but no BM yet, tolerating clear liquids   Endo: No hx of DM, CBGs 86-130  Insulin requirements in the past 24 hours: 2 units SSI sensitive Lytes: K 3.8, Cl 101, phos 2.6>3.3 Renal: SCr 3.83>1.2 AKI improving, BUN to 23>16, UOP 2.5 ml/kg/hr - I&O cath x1 to void Pulm: Vent @30  > extubated Cards: VSS Hepatobil: AST/ALT 79/46, Tbili 0.4 (pending hepatitis workup, SBP r/o, some ascities), TG 216>179 Neuro: Improving off sedation, PCA added ID: Zosyn for intra-abdominal inf   TPN Access: 04/20/19 TPN start date: 04/21/19 Nutritional Goals (per RD recs 10/28): KCal: 1900-2100 Protein: 90-110g Fluid: >1.8L  Goal TPN rate is 72 ml/hr  Current Nutrition:  Clear liquid, TPN  Plan:  Continue TPN at 72 mL/hr TPN will provide  100g AA,  52g lipids and  311g CHO for a total of  1977 kCal, meeting ~100 % of patient's needs Electrolytes in TPN: [place] K/phos/Mg, Cl:Ac 1:1 Daily multivitamin, trace elements in TPN D/c SSI with current CBGs at goal and little SSI use F/u clear diet toleration, advancement of diet TPN labs in AM  Bertis Ruddy, PharmD Clinical Pharmacist Please check  AMION for all [place] numbers 04/25/2019 7:11 AM

## 2019-04-25 NOTE — Progress Notes (Signed)
Given persistent tachycardia and WBC ordered CT abd pelvis w/ contrast. CT shows multiple rim enhancing fluid collections in lower quadrants and pelvic cul-de-sac, consistent with abscesses. Gen surgery made aware of findings. Appreciate their recommendations.    Tamsen Snider, MD PGY1  [phone]

## 2019-04-25 NOTE — Progress Notes (Signed)
Dilaudid 10 ml were wasted in sink; witness Judson Roch, Therapist, sports.

## 2019-04-25 NOTE — Progress Notes (Addendum)
[place] [facility] Office:  [phone] General Surgery Progress Note   LOS: 11 days  POD -  5 Days Post-Op  Chief Complaint: Abdominal pain  Assessment and Plan: 1.   EXPLORATORY LAPAROTOMY,  SMALL BOWEL RESECTION, partial omentectomy, drainage of abdominal abscess, Lysis Of Adhesion, Ileocecetomy - 04/20/2019 - White  WBC - 17,200 - 04/25/2019  On zosyn/diflucan  On clear liquids - no BM.  Will leave on clears. 2.  Anemia  Hgb - 7.9 - 04/25/2019 3.  Elevated Creatinine  Creat - 1.20 - 04/25/2019 4.  DVT prophylaxis - SQ heparin   Principal Problem:   SBO (small bowel obstruction) (HCC) Active Problems:   Hypothyroidism (acquired)   Amenorrhea   Acute renal injury (HCC)   Elevated TSH   Elevated serum free T4 level   Hyponatremia   Leukocytosis   Anemia   Respiratory failure, acute (HCC)  Subjective:  Doing okay.  She feels swollen.    Objective:   Vitals:   04/25/19 0743 04/25/19 0755  BP:  (!) 135/91  Pulse:    Resp: 14 17  Temp:  100 F (37.8 C)  SpO2: 98% 98%     Intake/Output from previous day:  10/31 0701 - 11/01 0700 In: 1347.3 [I.V.:1147.3; IV Piggyback:200] Out: 2956 [OZHYQ:6578; Drains:30]  Intake/Output this shift:  No intake/output data recorded.   Physical Exam:   General: WN WF who is alert and oriented.    HEENT: Normal. Pupils equal. .   Lungs: Clear.   Abdomen: Soft.  Has some BS.  Drain right abdomen - 30 cc recorded last 24 hours.   Wound: Clean.   Lab Results:    Recent Labs    04/24/19 0816 04/25/19 0317  WBC 16.6* 17.2*  HGB 8.6* 7.9*  HCT 26.6* 24.8*  PLT 707* 766*    BMET   Recent Labs    04/24/19 0816 04/25/19 0317  NA 138 137  K 3.6 3.8  CL 101 101  CO2 27 29  GLUCOSE 132* 126*  BUN 17 16  CREATININE 1.23* 1.20*  CALCIUM 7.8* 7.8*    PT/INR  No results for input(s): LABPROT, INR in the last 72 hours.  ABG  No results for input(s): PHART, HCO3 in the last 72 hours.  Invalid input(s): PCO2,  PO2   Studies/Results:  No results found.   Anti-infectives:   Anti-infectives (From admission, onward)   Start     Dose/Rate Route Frequency Ordered Stop   04/20/19 1430  fluconazole (DIFLUCAN) IVPB 200 mg     200 mg 100 mL/hr over 60 Minutes Intravenous Every 24 hours 04/20/19 1418 04/24/19 1454   04/19/19 1130  piperacillin-tazobactam (ZOSYN) IVPB 3.375 g     3.375 g 12.5 mL/hr over 240 Minutes Intravenous Every 8 hours 04/19/19 1122     04/15/19 1200  levofloxacin (LEVAQUIN) IVPB 750 mg  Status:  Discontinued     750 mg 100 mL/hr over 90 Minutes Intravenous Every 24 hours 04/15/19 1127 04/15/19 1149   04/15/19 1200  levofloxacin (LEVAQUIN) IVPB 750 mg  Status:  Discontinued     750 mg 100 mL/hr over 90 Minutes Intravenous Every 48 hours 04/15/19 1149 04/19/19 1110   04/14/19 1100  cefTRIAXone (ROCEPHIN) 1 g in sodium chloride 0.9 % 100 mL IVPB  Status:  Discontinued     1 g 200 mL/hr over 30 Minutes Intravenous Every 24 hours 04/14/19 1055 04/15/19 1127      Alphonsa Overall, MD, [facility] Surgery Office:  [phone] 04/25/2019  

## 2019-04-25 NOTE — Progress Notes (Signed)
Patient out of bed going to the bathroom. HR elevated in 160s. Patient verbalized that she is fain, no acute distress noted. MD is aware about patient's elevated HR when ambulating. Will continue to monitor. Marland Kitchen

## 2019-04-25 NOTE — Progress Notes (Signed)
Patient able to ambulate in the hallway approximately 400 feet. During ambulation patient's HR elevated 140-150s and patient complaints of SOB. Back to her room HR decreased to 120. MD aware. Will continue to monitor.

## 2019-04-25 NOTE — Progress Notes (Signed)
MD was notified about patient's condition this AM. Temp 100; HR 120-130s on rest and 140-150s when she is moving around. At this time patient is receiving oxygen 4L via [place] (was increased from 2L yesterday); patient complaining of feeling tight on her body. Will continue to monitor.

## 2019-04-25 NOTE — Progress Notes (Signed)
Patient is tachy and HR fluctuating in 110s to 130s. Stated she's a little anxious and feel some kind of fullness in her abdomen. She was R/A and was sating  98% to 100%. Now back on 2 L/[place] for comfort. Notified Dr. Benjamine Mola without any new order, stated  To continue monitoring and call back if HR goes to 150 and sustain there.

## 2019-04-25 NOTE — Progress Notes (Signed)
Patient called stated she was trying to go to the BR to have BM but didn't make. This RN responded to patient's call and noted moderate dark greenish watery stool in the hat and on the floor. Patient was cleaned and linens changed. Patient educated to call for assistance before getting up to prevent fall  due to IV pole and 02 tubing. Patient verbalized understanding. NO acute distress noted. HR was in the 140 s to 160s while up. Notified Dr. Sheppard Coil about her HR and the color of her stool and he said we'll keep an eye on it. Will continue to monitor.

## 2019-04-26 DIAGNOSIS — K651 Peritoneal abscess: Secondary | ICD-10-CM | POA: Diagnosis not present

## 2019-04-26 DIAGNOSIS — K56609 Unspecified intestinal obstruction, unspecified as to partial versus complete obstruction: Secondary | ICD-10-CM | POA: Diagnosis not present

## 2019-04-26 DIAGNOSIS — D62 Acute posthemorrhagic anemia: Secondary | ICD-10-CM | POA: Diagnosis not present

## 2019-04-26 DIAGNOSIS — N179 Acute kidney failure, unspecified: Secondary | ICD-10-CM | POA: Diagnosis not present

## 2019-04-26 DIAGNOSIS — K922 Gastrointestinal hemorrhage, unspecified: Secondary | ICD-10-CM | POA: Diagnosis not present

## 2019-04-26 LAB — COMPREHENSIVE METABOLIC PANEL
ALT: 30 U/L (ref 0–44)
AST: 36 U/L (ref 15–41)
Albumin: 1.2 g/dL — ABNORMAL LOW (ref 3.5–5.0)
Alkaline Phosphatase: 65 U/L (ref 38–126)
Anion gap: 7 (ref 5–15)
BUN: 21 mg/dL — ABNORMAL HIGH (ref 6–20)
CO2: 28 mmol/L (ref 22–32)
Calcium: 7.5 mg/dL — ABNORMAL LOW (ref 8.9–10.3)
Chloride: 100 mmol/L (ref 98–111)
Creatinine, Ser: 1.25 mg/dL — ABNORMAL HIGH (ref 0.44–1.00)
GFR calc Af Amer: >60 mL/min (ref >60)
GFR calc non Af Amer: 58 mL/min — ABNORMAL LOW (ref >60)
Glucose, Bld: 155 mg/dL — ABNORMAL HIGH (ref 70–99)
Potassium: 3.7 mmol/L (ref 3.5–5.1)
Sodium: 135 mmol/L (ref 135–145)
Total Bilirubin: 0.3 mg/dL (ref 0.3–1.2)
Total Protein: 3.8 g/dL — ABNORMAL LOW (ref 6.5–8.1)

## 2019-04-26 LAB — CBC
HCT: 15 % — ABNORMAL LOW (ref 36.0–46.0)
Hemoglobin: 4.8 g/dL — CL (ref 12.0–15.0)
MCH: 27.7 pg (ref 26.0–34.0)
MCHC: 32 g/dL (ref 30.0–36.0)
MCV: 86.7 fL (ref 80.0–100.0)
Platelets: 639 10*3/uL — ABNORMAL HIGH (ref 150–400)
RBC: 1.73 MIL/uL — ABNORMAL LOW (ref 3.87–5.11)
RDW: 14.6 % (ref 11.5–15.5)
WBC: 13.7 10*3/uL — ABNORMAL HIGH (ref 4.0–10.5)
nRBC: 0 % (ref 0.0–0.2)

## 2019-04-26 LAB — HEPATIC FUNCTION PANEL
ALT: 31 U/L (ref 0–44)
AST: 37 U/L (ref 15–41)
Albumin: 1.2 g/dL — ABNORMAL LOW (ref 3.5–5.0)
Alkaline Phosphatase: 65 U/L (ref 38–126)
Bilirubin, Direct: <0.1 mg/dL (ref 0.0–0.2)
Total Bilirubin: 0.1 mg/dL — ABNORMAL LOW (ref 0.3–1.2)
Total Protein: 3.8 g/dL — ABNORMAL LOW (ref 6.5–8.1)

## 2019-04-26 LAB — DIFFERENTIAL
Abs Immature Granulocytes: 0.82 10*3/uL — ABNORMAL HIGH (ref 0.00–0.07)
Basophils Absolute: 0 10*3/uL (ref 0.0–0.1)
Basophils Relative: 0 %
Eosinophils Absolute: 0.1 10*3/uL (ref 0.0–0.5)
Eosinophils Relative: 0 %
Immature Granulocytes: 6 %
Lymphocytes Relative: 7 %
Lymphs Abs: 0.9 10*3/uL (ref 0.7–4.0)
Monocytes Absolute: 0.8 10*3/uL (ref 0.1–1.0)
Monocytes Relative: 6 %
Neutro Abs: 11.1 10*3/uL — ABNORMAL HIGH (ref 1.7–7.7)
Neutrophils Relative %: 81 %

## 2019-04-26 LAB — HEMOGLOBIN AND HEMATOCRIT, BLOOD
HCT: 22.3 % — ABNORMAL LOW (ref 36.0–46.0)
Hemoglobin: 7.8 g/dL — ABNORMAL LOW (ref 12.0–15.0)

## 2019-04-26 LAB — BASIC METABOLIC PANEL
Anion gap: 8 (ref 5–15)
BUN: 21 mg/dL — ABNORMAL HIGH (ref 6–20)
CO2: 27 mmol/L (ref 22–32)
Calcium: 7.4 mg/dL — ABNORMAL LOW (ref 8.9–10.3)
Chloride: 100 mmol/L (ref 98–111)
Creatinine, Ser: 1.23 mg/dL — ABNORMAL HIGH (ref 0.44–1.00)
GFR calc Af Amer: >60 mL/min (ref >60)
GFR calc non Af Amer: 60 mL/min — ABNORMAL LOW (ref >60)
Glucose, Bld: 153 mg/dL — ABNORMAL HIGH (ref 70–99)
Potassium: 3.7 mmol/L (ref 3.5–5.1)
Sodium: 135 mmol/L (ref 135–145)

## 2019-04-26 LAB — TRIGLYCERIDES
Triglycerides: 151 mg/dL — ABNORMAL HIGH (ref <150)
Triglycerides: 730 mg/dL — ABNORMAL HIGH (ref <150)

## 2019-04-26 LAB — PREALBUMIN: Prealbumin: 5.9 mg/dL — ABNORMAL LOW (ref 18–38)

## 2019-04-26 LAB — PROTIME-INR
INR: 1.4 — ABNORMAL HIGH (ref 0.8–1.2)
Prothrombin Time: 16.9 seconds — ABNORMAL HIGH (ref 11.4–15.2)

## 2019-04-26 LAB — GLUCOSE, CAPILLARY
Glucose-Capillary: 158 mg/dL — ABNORMAL HIGH (ref 70–99)
Glucose-Capillary: 171 mg/dL — ABNORMAL HIGH (ref 70–99)

## 2019-04-26 LAB — MAGNESIUM: Magnesium: 1.4 mg/dL — ABNORMAL LOW (ref 1.7–2.4)

## 2019-04-26 LAB — PREPARE RBC (CROSSMATCH)

## 2019-04-26 LAB — LACTIC ACID, PLASMA
Lactic Acid, Venous: 1.1 mmol/L (ref 0.5–1.9)
Lactic Acid, Venous: 1.4 mmol/L (ref 0.5–1.9)

## 2019-04-26 LAB — APTT: aPTT: 34 seconds (ref 24–36)

## 2019-04-26 LAB — PHOSPHORUS: Phosphorus: 5.1 mg/dL — ABNORMAL HIGH (ref 2.5–4.6)

## 2019-04-26 LAB — SAVE SMEAR(SSMR), FOR PROVIDER SLIDE REVIEW

## 2019-04-26 MED ORDER — SODIUM CHLORIDE 0.9% IV SOLUTION: Freq: Once | INTRAVENOUS | Status: DC

## 2019-04-26 MED ORDER — LACTATED RINGERS IV BOLUS
500.0000 mL | Freq: Once | INTRAVENOUS | Status: AC
  Administered 2019-04-26: 03:00:00 500 mL via INTRAVENOUS

## 2019-04-26 MED ORDER — MAGNESIUM SULFATE 2 GM/50ML IV SOLN
2.0000 g | Freq: Once | INTRAVENOUS | Status: AC
  Administered 2019-04-26: 2 g via INTRAVENOUS
  Filled 2019-04-26: qty 50

## 2019-04-26 MED ORDER — ONDANSETRON HCL 4 MG/2ML IJ SOLN
4.0000 mg | Freq: Four times a day (QID) | INTRAMUSCULAR | Status: DC | PRN
  Administered 2019-04-26 – 2019-04-28 (×2): 4 mg via INTRAVENOUS
  Filled 2019-04-26 (×2): qty 2

## 2019-04-26 MED ORDER — VITAMIN K1 10 MG/ML IJ SOLN
5.0000 mg | Freq: Once | INTRAVENOUS | Status: AC
  Administered 2019-04-26: 08:00:00 5 mg via INTRAVENOUS
  Filled 2019-04-26: qty 0.5

## 2019-04-26 MED ORDER — SODIUM CHLORIDE 0.9% IV SOLUTION
Freq: Once | INTRAVENOUS | Status: AC
  Administered 2019-04-26: 05:00:00 via INTRAVENOUS

## 2019-04-26 MED ORDER — TRAVASOL 10 % IV SOLN
INTRAVENOUS | Status: AC
  Administered 2019-04-26: 18:00:00 via INTRAVENOUS
  Filled 2019-04-26: qty 1002.24

## 2019-04-26 MED ORDER — ONDANSETRON HCL 4 MG/2ML IJ SOLN: 4.0000 mg | Freq: Four times a day (QID) | INTRAMUSCULAR | Status: DC

## 2019-04-26 NOTE — Progress Notes (Signed)
   Subjective:  Patient had a large bloody bowel movement overnight and found to have a hemoglobin 4.8. Blood transfusions started.  On exam patient is receiving first unit of RBC. Denies currently feeling light headed while in bed. CT abdomen and pelvis results are reviewed. Continues to have abdominal pain, better controlled on IV dilaudid. Reports new abdominal cramps which started with recent large bowel movement.   Objective:  Vital signs in last 24 hours: Vitals:   04/26/19 0225 04/26/19 0340 04/26/19 0501 04/26/19 0530  BP: 136/90 120/81 121/79   Pulse: (!) 132 (!) 122 (!) 142   Resp: 16 17 20    Temp:   98.5 F (36.9 C) 97.7 F (36.5 C)  TempSrc:   Oral Oral  SpO2: 100% 100% 100%   Weight:      Height:       Physical Exam Constitutional:      Appearance: She is well-developed. She is ill-appearing.  Cardiovascular:     Rate and Rhythm: Regular rhythm. Tachycardia present.     Heart sounds: No murmur. No friction rub. No gallop.   Pulmonary:     Breath sounds: No wheezing, rhonchi or rales.  Abdominal:     General: Bowel sounds are decreased.     Tenderness: There is abdominal tenderness (Continues to have mild tenderness in abdomen.  ).     Comments: Drain output 17.5 cc in last 24hrs  Neurological:     Mental Status: She is alert.     Assessment/Plan:  Principal Problem:   SBO (small bowel obstruction) (HCC) Active Problems:   Hypothyroidism (acquired)   Amenorrhea   Acute renal injury (HCC)   Elevated TSH   Elevated serum free T4 level   Hyponatremia   Leukocytosis   Anemia   Respiratory failure, acute ([place])  28 year old female with a pPMHx of anorexia,amenorrhea(hypothalamic hypogonadism),Hx ofMeckel'sdiverticulum (s/p small bowel resection)who presented with SBO underwent surgical intervention after failing conservative management. Found to have necrotic small bowel requiring resection, volvulus, and abscesses requiring drainage.Clinical course  also complicated byAKI andupper GI bleed.On 11/1 patient found to have new abdominal abscesses and had a large blood bowel movement overnight.  #Symptomatic anemia 2/2 GI bleed # Iron deficiency anemia   Overnight team paged for large bloody bowel movement. Patient symptomatic.  - Transfuse 4 units RBC - Follow up post transfusion H&H  #SBO S/p Ex Lap with ileocecectomy with drainage of intra-abdominal abscess and partial omentectomy - POD#6; CT abdomen pelvis show large abdominal abscesses. Gen surgery is following and IR surgery has been consulted.  - Continue IV Hydromorphone 1 mg q2hrs for sever pain - continuing IV zosyn , on day 8  - she is on TPN for nutrition and clear liquid diet - appreciate general surgery recommendations - appreciate IR surgery recommendations   #AKI - secondary to volume depletion with n/v and poor PO intake . Baseline Cr ~.7. Elevated to 3.83 on 10/21 , downtrend and 1.23 today, improved with IV fluids. Patient appears volume overloaded on exam. Will continue to monitor diuresis. Urine output of 3.8L in last 24 hrs. Problem with bladder scan this morning.  - continue to trend BMP - In/out cath, consider placing foley if patient has another bladder scan >300 ml  Dispo: Anticipated discharge pending clinical improvement.   Tamsen Snider, MD PGY1  [phone]

## 2019-04-26 NOTE — Progress Notes (Signed)
Dr. Sheppard Coil and Dr. Carleene Cooper are  At the  bedside at this moment. Multiple orders received. As seen in epic. Also notified rapid response and he's at the bedside as well. Will call  the surgeon too.

## 2019-04-26 NOTE — Progress Notes (Addendum)
Patient reportedly had a large bloody bowel movement, nausea and dizziness. Saw the patient at the bedside.  Toilet had large amount of dark red blood in the bowl. The patient says she is starting to feel dizzy when she stands up and walks, but is okay if sitting still.  Her abdominal pain has not worsened, but has not improved either.  Patient says she has had bloody bowel movements in the past, but the last one was 6 years ago.  Physical exam was notable for diffuse abdominal tenderness to light palpation as well as a jaundiced appearance.  Given the patient's most recent bloody bowel movement in addition to dizziness and tachycardia (132), I am concerned for an active bleed causing symptomatic anemia. Will order stat type and screen, CBC, 2 units pRBC, hepatic function panel, PT PTT and INR, lactate, and 500 cc LR bolus. Stopped heparin. Surgery team has been paged and we are awaiting response and recommendations.  Earlene Plater, MD Internal Medicine, PGY1 Pager: [phone]  04/26/2019,3:04 AM

## 2019-04-26 NOTE — Progress Notes (Signed)
6 Days Post-Op  Subjective: CC: Bloody BM's, abdominal pain Patient reports that late last night she began having bloody diarrhea.  She has had 5 BMs since onset.  Last was very early this AM. None further. She was noted to be anemic with Hgb 4.8. She is currently receiving blood and her heparin was stopped.  She reports that her pain in her abdomen is slightly worse today including in the right lower quadrant and left lower quadrant.  She does have some nausea but has not had any emesis.  No flatus.  She is concerned about not being able to keep up with her schoolwork.  Objective: Vital signs in last 24 hours: Temp:  [97.7 F (36.5 C)-99.6 F (37.6 C)] 98.2 F (36.8 C) (11/02 0918) Pulse Rate:  [112-152] 120 (11/02 0918) Resp:  [14-26] 16 (11/02 0918) BP: (106-144)/(78-106) 120/80 (11/02 0918) SpO2:  [99 %-100 %] 100 % (11/02 0918) Last BM Date: 04/25/19  Intake/Output from previous day: 11/01 0701 - 11/02 0700 In: 1254 [I.V.:839; Blood:315; IV Piggyback:100] Out: 5408.5 [Urine:3891; Drains:17.5; Stool:1500] Intake/Output this shift: Total I/O In: 388 [Blood:388] Out: -   PE: Gen:  Alert, NAD, pleasant Card:  Tachycardic with regular rhythm  Pulm:  CTA b/l, no W/R/R, effort normal Abd: Mild distension, tenderness of the RLQ and LLQ, hypoactive bowel sounds. Midline wound with dressing in place, clean and dry with healthy granulation tissue and some fibrinous tissue at base. No evidence of drainage or active wound infection. No dehiscence. Drain with SS output. 17.5cc/24 hours  Ext:  2+ LE edema Psych: A&Ox3  Skin: no rashes noted, warm and dry  Lab Results:  Recent Labs    04/25/19 0317 04/26/19 0310  WBC 17.2* 13.7*  HGB 7.9* 4.8*  HCT 24.8* 15.0*  PLT 766* 639*   BMET Recent Labs    04/25/19 0317 04/26/19 0310  NA 137 135  135  K 3.8 3.7  3.7  CL 101 100  100  CO2 29 27  28   GLUCOSE 126* 153*  155*  BUN 16 21*  21*  CREATININE 1.20* 1.23*  1.25*   CALCIUM 7.8* 7.4*  7.5*   PT/INR Recent Labs    04/26/19 0310  LABPROT 16.9*  INR 1.4*   CMP     Component Value Date/Time   NA 135 04/26/2019 0310   NA 135 04/26/2019 0310   NA 127 (L) 12/22/2017 1449   K 3.7 04/26/2019 0310   K 3.7 04/26/2019 0310   CL 100 04/26/2019 0310   CL 100 04/26/2019 0310   CO2 28 04/26/2019 0310   CO2 27 04/26/2019 0310   GLUCOSE 155 (H) 04/26/2019 0310   GLUCOSE 153 (H) 04/26/2019 0310   BUN 21 (H) 04/26/2019 0310   BUN 21 (H) 04/26/2019 0310   BUN 9 12/22/2017 1449   CREATININE 1.25 (H) 04/26/2019 0310   CREATININE 1.23 (H) 04/26/2019 0310   CALCIUM 7.5 (L) 04/26/2019 0310   CALCIUM 7.4 (L) 04/26/2019 0310   PROT 3.8 (L) 04/26/2019 0310   PROT 3.8 (L) 04/26/2019 0310   PROT 6.8 12/22/2017 1449   ALBUMIN 1.2 (L) 04/26/2019 0310   ALBUMIN 1.2 (L) 04/26/2019 0310   ALBUMIN 5.0 12/22/2017 1449   AST 36 04/26/2019 0310   AST 37 04/26/2019 0310   ALT 30 04/26/2019 0310   ALT 31 04/26/2019 0310   ALKPHOS 65 04/26/2019 0310   ALKPHOS 65 04/26/2019 0310   BILITOT 0.3 04/26/2019 0310   BILITOT  0.1 (L) 04/26/2019 0310   BILITOT 0.2 12/22/2017 1449   GFRNONAA 58 (L) 04/26/2019 0310   GFRNONAA 60 (L) 04/26/2019 0310   GFRAA >60 04/26/2019 0310   GFRAA >60 04/26/2019 0310   Lipase     Component Value Date/Time   LIPASE 18 04/14/2019 0846       Studies/Results: Ct Abdomen Pelvis W Contrast  Result Date: 04/25/2019 CLINICAL DATA:  Postop from small bowel resection for necrotic bowel. Worsening leukocytosis and tachycardia. EXAM: CT ABDOMEN AND PELVIS WITH CONTRAST TECHNIQUE: Multidetector CT imaging of the abdomen and pelvis was performed using the standard protocol following bolus administration of intravenous contrast. CONTRAST:  100mL OMNIPAQUE IOHEXOL 300 MG/ML  SOLN COMPARISON:  Noncontrast CT on 04/19/2019 FINDINGS: Lower Chest: Increased size of moderate left and small right pleural effusions. Increased bilateral lower lobe  atelectasis. Hepatobiliary: No hepatic masses identified. Gallbladder is unremarkable. No evidence of biliary ductal dilatation. Pancreas:  No mass or inflammatory changes. Spleen: Within normal limits in size and appearance. Adrenals/Urinary Tract: No masses identified. No evidence of hydronephrosis. Stomach/Bowel: Surgical drain is seen in place. Small amount of free intraperitoneal air is attributable to recent surgery. No evidence of bowel obstruction. Mild diffuse small bowel wall thickening, mesenteric inflammatory changes, and diffuse body wall edema. Mild ascites is seen as well as diffuse peritoneal enhancement. Several rim enhancing fluid collections are seen which are located in the left lower quadrant measuring 5.1 cm, right lower quadrant measuring 5.1 cm, and pelvic cul-de-sac measuring 6.4 cm, which are consistent with abscesses. Vascular/Lymphatic: No pathologically enlarged lymph nodes. No abdominal aortic aneurysm. Mesenteric vessels are patent. Reproductive:  No mass or other significant abnormality. Other:  None. Musculoskeletal:  No suspicious bone lesions identified. IMPRESSION: Mild ascites and diffuse peritoneal enhancement, consistent with peritonitis. Small amount of postop free air. Several rim enhancing fluid collections in bilateral lower quadrants and pelvic cul-de-sac, largest measuring 6.4 cm, consistent with abscesses. Diffuse bowel wall thickening, mesenteric inflammatory changes, and diffuse body wall edema. No evidence of bowel obstruction. Increased size of moderate left and small right pleural effusions, and bilateral lower lobe atelectasis. Electronically Signed   By: Danae OrleansJohn A Stahl M.D.   On: 04/25/2019 13:16    Anti-infectives: Anti-infectives (From admission, onward)   Start     Dose/Rate Route Frequency Ordered Stop   04/20/19 1430  fluconazole (DIFLUCAN) IVPB 200 mg     200 mg 100 mL/hr over 60 Minutes Intravenous Every 24 hours 04/20/19 1418 04/24/19 1454    04/19/19 1130  piperacillin-tazobactam (ZOSYN) IVPB 3.375 g     3.375 g 12.5 mL/hr over 240 Minutes Intravenous Every 8 hours 04/19/19 1122     04/15/19 1200  levofloxacin (LEVAQUIN) IVPB 750 mg  Status:  Discontinued     750 mg 100 mL/hr over 90 Minutes Intravenous Every 24 hours 04/15/19 1127 04/15/19 1149   04/15/19 1200  levofloxacin (LEVAQUIN) IVPB 750 mg  Status:  Discontinued     750 mg 100 mL/hr over 90 Minutes Intravenous Every 48 hours 04/15/19 1149 04/19/19 1110   04/14/19 1100  cefTRIAXone (ROCEPHIN) 1 g in sodium chloride 0.9 % 100 mL IVPB  Status:  Discontinued     1 g 200 mL/hr over 30 Minutes Intravenous Every 24 hours 04/14/19 1055 04/15/19 1127       Assessment/Plan Anemia - Hgb 4.8. Receiving blood. Monitor bloody bm's.   SBO 2/2 volvulus of segment of ileum Intraabdominal abscess S/p Ex-Lap with ileocecectomy, SBR, drainage of intra-abdominal abscesses, partial  omentectomy, adhesiolysis - Dr. Angelena Form - 04/20/2019 - POD #6 - CT 11/1 w/ rim enhancing fluid collections bilateral lower quadrants. Consult to IR for drainage.  - Continue IV abx  FEN - NPO VTE - SCDs, chemical prophylaxis on hold ID - Zosyn 10/26 >>   LOS: 12 days    Jacinto Halim , [facility] Surgery 04/26/2019, 10:29 AM Please see Amion for pager number during day hours 7:00am-4:30pm

## 2019-04-26 NOTE — Progress Notes (Signed)
[place] NOTE   Pharmacy Consult for TPN Indication: SBO  Patient Measurements: Height: 5\' 6"  (167.6 cm) Weight: 162 lb 0.6 oz (73.5 kg) IBW/kg (Calculated) : 59.3 TPN AdjBW (KG): 60.4 Body mass index is 26.15 kg/m. Usual Weight: TBD  Assessment: 88 YOF presenting epigastric abd pain, nausea and vomiting.  Distention despite having semi-solid stool and tolerated clear liquid diet on 10/26, however CT shows SBO and for exlap 10/27> found sbo (intraabdominal abscesses), had small bowel recestion + drainage of abscesses and partial omentectomy.  Resolving hx of anorexia, has gained weight since this summer.  Albumin low this admission, at risk for refeeding syndrome.    GI: NG removed 10/30, now s/p surgery as above.  LBM 11/1 - large and bloody Prealbumin 5.9 tolerating clear liquids, but had large bloody BM overnight HGB 4.8 - required transfusion  Endo: No hx of DM, CBGs 111-158 Insulin requirements in the past 24 hours: 0 units SSI sensitive Lytes: K 3.7, Cl 100, phos 5.1, Mag 1.4 (replace) Renal: SCr 3.83>1.2 AKI improving, BUN to 23>16>21, UOP 2.2 ml/kg/hr - I&O cath x1 to void Pulm: Vent @30  > extubated Cards: VSS Hepatobil: LFTs WNL, TG [phone] Neuro: Improving off sedation, PCA added ID: Zosyn and Diflucan for intra-abdominal infection  TPN Access: 04/20/19 TPN start date: 04/21/19 Nutritional Goals (per RD recs 10/28): KCal: 1900-2100 Protein: 90-110g Fluid: >1.8L  Goal TPN rate is 72 ml/hr  Current Nutrition:  Clear liquid, TPN  Plan:  Continue TPN at 72 mL/hr TPN will provide  100g AA,  52g lipids and  311g CHO for a total of  1977 kCal, meeting ~100 % of patient's needs Electrolytes in TPN: [place] K/Mg, decr phos, Cl:Ac 1:1 Daily multivitamin, trace elements in TPN D/c SSI with current CBGs at goal and little SSI use Mag 2 grams IV x 1 F/u on clear liquid diet intake   Alanda Slim, PharmD,  [facility] Clinical Pharmacist Please see AMION for all Pharmacists' Contact Phone Numbers 04/26/2019, 9:28 AM

## 2019-04-26 NOTE — Progress Notes (Signed)
Bladder scan machine malfunction. As a result, the bladder scan is not done this morning. Will check with another unit.

## 2019-04-26 NOTE — Progress Notes (Signed)
HGB=4.8, DR. Alexander notified  without any new order. This RN asked if he still wants blood to be   tranfused over 3  hours and he said yes.Will continue to monitor

## 2019-04-26 NOTE — Progress Notes (Signed)
Patient received four PRBC without any complications or adverse reaction. A repeat H & H order  Post blood transfusion as order by MD. Hoffman,will continue to monitor patient.

## 2019-04-26 NOTE — Consult Note (Signed)
Chief Complaint: Patient was seen in consultation today for intraabdominal abscess/drain placement.  Referring Physician(s): Jacinto Halim ([facility])  Supervising Physician: Irish Lack  Patient Status: [facility] - In-pt  History of Present Illness: Kim Myers is a 28 y.o. female with a past medical history of anorexia, hypothyroidism, gluten intolerance, and anemia. She presented to [facility] ED 04/14/2019 with complaint of abdominal pain. In ED, CT abdomen/pelvis 04/14/2019 revealed diffuse dilated air-filled loops of small bowel concerning for enteritis with associated ileus versus SBO. She was admitted for further management. Surgery was consulted and patient went to OR 04/20/2019 for exploratory laparotomy with ileocecectomy, small bowel resection, drainage of intra-abdominal abscess, partial omentectomy, and adhesiolysis by Dr. Cliffton Asters. Follow-up CT abdomen/pelvis 04/25/2019 revealed bilateral lower quadrant/pelvic cul-de-sac fluid collections consistent with abscesses.  CT abdomen/pelvis 04/25/2019: 1. Mild ascites and diffuse peritoneal enhancement, consistent with peritonitis. Small amount of postop free air. 2. Several rim enhancing fluid collections in bilateral lower quadrants and pelvic cul-de-sac, largest measuring 6.4 cm, consistent with abscesses. 3. Diffuse bowel wall thickening, mesenteric inflammatory changes, and diffuse body wall edema. No evidence of bowel obstruction. 4. Increased size of moderate left and small right pleural effusions, and bilateral lower lobe atelectasis.  IR consulted by Leary Roca, PA-C for possible image-guided intraabdominal abscess aspiration/drain placement. Patient awake and alert laying in bed. Accompanied by mother at bedside. Complains of abdominal pain, rated 5-6 at this time. Complains of dyspnea, states its hard to take a deep breath due to abdominal pain. Denies fever, chills, chest pain, or headache.   Past Medical History:  Diagnosis  Date   Amenorrhea    Anemia    Blood transfusion without reported diagnosis    Gluten intolerance    History of anemia    resolved with dietary changes   Hypothyroidism (acquired) 03/25/2018    Past Surgical History:  Procedure Laterality Date   ILEOCECETOMY  04/20/2019   Procedure: Ileocecetomy;  Surgeon: Andria Meuse, MD;  Location: [facility] OR;  Service: General;;   LAPAROTOMY N/A 04/20/2019   Procedure: EXPLORATORY LAPAROTOMY,  SMALL BOWEL RESECTION, partial omentectomy, drainage of abdominal abscess;  Surgeon: Andria Meuse, MD;  Location: [facility] OR;  Service: General;  Laterality: N/A;   LYSIS OF ADHESION  04/20/2019   Procedure: Lysis Of Adhesion;  Surgeon: Andria Meuse, MD;  Location: [facility] OR;  Service: General;;   MECKEL DIVERTICULUM EXCISION  at 85   started as laparoscopy, ended in laparotomy    Allergies: Gluten meal, 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], Food, and Peanut-containing drug products  Medications: Prior to Admission medications   Not on File     Family History  Problem Relation Age of Onset   Breast cancer Paternal Grandmother    Ovarian cancer Maternal Grandmother     Social History   Socioeconomic History   Marital status: Single    Spouse name: Not on file   Number of children: Not on file   Years of education: Not on file   Highest education level: Not on file  Occupational History   Not on file  Social Needs   Financial resource strain: Not on file   Food insecurity    Worry: Not on file    Inability: Not on file   Transportation needs    Medical: Not on file    Non-medical: Not on file  Tobacco Use   Smoking status: Never Smoker   Smokeless tobacco: Never Used  Substance and Sexual Activity   Alcohol use: No  Drug use: No   Sexual activity: Not Currently    Birth control/protection: None  Lifestyle   Physical activity    Days per week: Not on file    Minutes per session: Not on file    Stress: Not on file  Relationships   Social connections    Talks on phone: Not on file    Gets together: Not on file    Attends religious service: Not on file    Active member of club or organization: Not on file    Attends meetings of clubs or organizations: Not on file    Relationship status: Not on file  Other Topics Concern   Not on file  Social History Narrative   Not on file     Review of Systems: A 12 point ROS discussed and pertinent positives are indicated in the HPI above.  All other systems are negative.  Review of Systems  Constitutional: Negative for chills and fever.  Respiratory: Positive for shortness of breath. Negative for wheezing.   Cardiovascular: Negative for chest pain and palpitations.  Gastrointestinal: Positive for abdominal pain.  Neurological: Negative for headaches.  Psychiatric/Behavioral: Negative for behavioral problems and confusion.    Vital Signs: BP 129/81    Pulse (!) 104    Temp 98.1 F (36.7 C) (Oral)    Resp 15    Ht 5\' 6"  (1.676 m)    Wt 162 lb 0.6 oz (73.5 kg)    SpO2 100%    BMI 26.15 kg/m   Physical Exam Vitals signs and nursing note reviewed.  Constitutional:      General: She is not in acute distress.    Appearance: Normal appearance.  Cardiovascular:     Rate and Rhythm: Regular rhythm. Tachycardia present.     Heart sounds: Normal heart sounds. No murmur.  Pulmonary:     Effort: Pulmonary effort is normal. No respiratory distress.     Breath sounds: Normal breath sounds. No wheezing.  Abdominal:     General: There is distension.     Tenderness: There is abdominal tenderness.     Comments: (+) midline incision, c/d/i. (+) surgical drain.  Skin:    General: Skin is warm and dry.  Neurological:     Mental Status: She is alert and oriented to person, place, and time.  Psychiatric:        Mood and Affect: Mood normal.        Behavior: Behavior normal.        Thought Content: Thought content normal.        Judgment:  Judgment normal.      MD Evaluation Airway: WNL Heart: WNL Abdomen: WNL Chest/ Lungs: WNL ASA  Classification: 2 Mallampati/Airway Score: Two   Imaging: Ct Abdomen Pelvis Wo Contrast  Result Date: 04/19/2019 CLINICAL DATA:  Low-grade bowel obstruction, abdominal distention. EXAM: CT ABDOMEN AND PELVIS WITHOUT CONTRAST TECHNIQUE: Multidetector CT imaging of the abdomen and pelvis was performed following the standard protocol without IV contrast. COMPARISON:  April 14, 2019. FINDINGS: Lower chest: Small left pleural effusion is noted with adjacent subsegmental atelectasis. Hepatobiliary: No focal liver abnormality is seen. No gallstones, gallbladder wall thickening, or biliary dilatation. Pancreas: Unremarkable. No pancreatic ductal dilatation or surrounding inflammatory changes. Spleen: Normal in size without focal abnormality. Adrenals/Urinary Tract: Adrenal glands are unremarkable. Kidneys are normal, without renal calculi, focal lesion, or hydronephrosis. Bladder is unremarkable. Stomach/Bowel: Status post appendectomy. Stomach is unremarkable. Severely dilated small bowel loops are noted which are more  prominent compared to prior exam. There is seen some contrast within the dilated small bowel loops, but no colonic contrast is noted. These findings are highly concerning for distal small bowel obstruction. Distal small bowel loops are not well visualized due to lack of intravenous contrast as well as no oral contrast in the distal small bowel. Vascular/Lymphatic: No significant vascular findings are present. No enlarged abdominal or pelvic lymph nodes. Reproductive: Uterus and bilateral adnexa are unremarkable. Other: Mild anasarca is noted. Minimal ascites is noted. Small fat containing periumbilical hernia is noted. Musculoskeletal: No acute or significant osseous findings. IMPRESSION: Significantly worsening small bowel dilatation is noted most consistent with distal small bowel obstruction.  Mild anasarca is noted.  Minimal ascites is noted. Small left pleural effusion is noted with adjacent subsegmental atelectasis. Electronically Signed   By: Lupita RaiderJames  Green Jr M.D.   On: 04/19/2019 13:03   Ct Abdomen Pelvis Wo Contrast  Result Date: 04/14/2019 CLINICAL DATA:  28 year old female with abdominal pain. EXAM: CT ABDOMEN AND PELVIS WITHOUT CONTRAST TECHNIQUE: Multidetector CT imaging of the abdomen and pelvis was performed following the standard protocol without IV contrast. COMPARISON:  CT of the chest abdomen pelvis dated 04/20/2017 FINDINGS: Evaluation of this exam is limited in the absence of intravenous contrast. Lower chest: Partially visualized small left and probable trace right pleural effusion. The visualized lung bases are clear. There is hypoattenuation of the cardiac blood pool suggestive of a degree of anemia. Clinical correlation is recommended. Partially visualized anterior pericardial effusion measuring up to 7 mm in thickness. No intra-abdominal free air. Small to moderate ascites. Hepatobiliary: No focal liver abnormality is seen. No gallstones, gallbladder wall thickening, or biliary dilatation. Pancreas: Unremarkable. No pancreatic ductal dilatation or surrounding inflammatory changes. Spleen: Normal in size without focal abnormality. Adrenals/Urinary Tract: The adrenal glands are unremarkable. There is no hydronephrosis or nephrolithiasis on either side. The visualized ureters and urinary bladder appear unremarkable. Stomach/Bowel: Evaluation of the bowel is very limited in the absence of oral contrast and secondary to ascites. There is postsurgical changes of bowel with anastomotic suture in the left upper abdomen. Diffusely dilated air-filled loops of small bowel throughout the abdomen measure up to 3.3 cm in diameter. A definite transition point is not identified. There is however apparent area of tethering of the adjacent small bowel (series 3, image 50). Findings therefore may  represent enteritis with associated ileus versus small-bowel obstruction secondary to adhesions. Clinical correlation is recommended. CT with oral contrast or small-bowel series may provide better evaluation. The colon appears unremarkable. Appendectomy. Vascular/Lymphatic: The abdominal aorta and IVC are unremarkable. No portal venous gas. There is no adenopathy. Reproductive: The uterus is grossly unremarkable. Probable 3 cm left ovarian dominant follicle or corpus luteum. Other: Small fat containing supraumbilical hernia. Musculoskeletal: No acute or significant osseous findings. IMPRESSION: 1. Diffusely dilated air-filled loops of small bowel throughout the abdomen may represent enteritis with associated ileus versus small-bowel obstruction. CT with oral contrast or small-bowel series may provide better evaluation. 2. Small to moderate ascites. 3. No hydronephrosis or nephrolithiasis. Electronically Signed   By: Elgie CollardArash  Radparvar M.D.   On: 04/14/2019 11:10   Ct Abdomen Pelvis W Contrast  Result Date: 04/25/2019 CLINICAL DATA:  Postop from small bowel resection for necrotic bowel. Worsening leukocytosis and tachycardia. EXAM: CT ABDOMEN AND PELVIS WITH CONTRAST TECHNIQUE: Multidetector CT imaging of the abdomen and pelvis was performed using the standard protocol following bolus administration of intravenous contrast. CONTRAST:  100mL OMNIPAQUE IOHEXOL 300 MG/ML  SOLN COMPARISON:  Noncontrast CT on 04/19/2019 FINDINGS: Lower Chest: Increased size of moderate left and small right pleural effusions. Increased bilateral lower lobe atelectasis. Hepatobiliary: No hepatic masses identified. Gallbladder is unremarkable. No evidence of biliary ductal dilatation. Pancreas:  No mass or inflammatory changes. Spleen: Within normal limits in size and appearance. Adrenals/Urinary Tract: No masses identified. No evidence of hydronephrosis. Stomach/Bowel: Surgical drain is seen in place. Small amount of free intraperitoneal  air is attributable to recent surgery. No evidence of bowel obstruction. Mild diffuse small bowel wall thickening, mesenteric inflammatory changes, and diffuse body wall edema. Mild ascites is seen as well as diffuse peritoneal enhancement. Several rim enhancing fluid collections are seen which are located in the left lower quadrant measuring 5.1 cm, right lower quadrant measuring 5.1 cm, and pelvic cul-de-sac measuring 6.4 cm, which are consistent with abscesses. Vascular/Lymphatic: No pathologically enlarged lymph nodes. No abdominal aortic aneurysm. Mesenteric vessels are patent. Reproductive:  No mass or other significant abnormality. Other:  None. Musculoskeletal:  No suspicious bone lesions identified. IMPRESSION: Mild ascites and diffuse peritoneal enhancement, consistent with peritonitis. Small amount of postop free air. Several rim enhancing fluid collections in bilateral lower quadrants and pelvic cul-de-sac, largest measuring 6.4 cm, consistent with abscesses. Diffuse bowel wall thickening, mesenteric inflammatory changes, and diffuse body wall edema. No evidence of bowel obstruction. Increased size of moderate left and small right pleural effusions, and bilateral lower lobe atelectasis. Electronically Signed   By: Danae Orleans M.D.   On: 04/25/2019 13:16   US Renal  Result Date: 04/14/2019 CLINICAL DATA:  Acute kidney injury EXAM: RENAL / URINARY TRACT ULTRASOUND COMPLETE COMPARISON:  CT 04/14/2019 FINDINGS: Right Kidney: Renal measurements: 11.8 x 4.6 x 6.7 cm = volume: 191 mL. Mildly increased renal cortical echogenicity. No mass or hydronephrosis visualized. Left Kidney: Renal measurements: 14.4 x 5.9 x 5.0 cm = volume: 223 mL. Echogenicity within normal limits. No mass or hydronephrosis visualized. Bladder: Appears normal for degree of bladder distention. Other: Small to moderate volume ascites throughout the abdomen. IMPRESSION: 1. Negative for obstructive uropathy. 2. Slightly increased renal  cortical echogenicity of the right kidney, which could reflect medical renal disease. 3. Left kidney is asymmetrically large relative to the right suggesting compensatory hypertrophy. 4. Small to moderate volume ascites. Electronically Signed   By: Duanne Guess M.D.   On: 04/14/2019 15:14   Dg Chest Port 1 View  Result Date: 04/20/2019 CLINICAL DATA:  Hypoxia EXAM: PORTABLE CHEST 1 VIEW COMPARISON:  April 20, 2017 FINDINGS: Endotracheal tube tip is 3.9 cm above the carina. Nasogastric tube tip and side port are in the stomach. No pneumothorax. Lungs are clear. Heart size and pulmonary vascularity are normal. No adenopathy. No bone lesions. IMPRESSION: Tube positions as described without pneumothorax. No edema or consolidation. Cardiac silhouette within normal limits. Electronically Signed   By: Bretta Bang III M.D.   On: 04/20/2019 18:14   Dg Abd Portable 1v  Result Date: 04/18/2019 CLINICAL DATA:  Abdominal pain EXAM: PORTABLE ABDOMEN - 1 VIEW COMPARISON:  Abdominal radiograph dated 04/16/2019 FINDINGS: The bowel gas pattern is normal. Air-fluid levels cannot be assessed on the supine radiograph. No radio-opaque calculi or other significant radiographic abnormality are seen. IMPRESSION: Nonobstructive bowel gas pattern. Electronically Signed   By: Romona Curls M.D.   On: 04/18/2019 16:15   Dg Abd Portable 1v-small Bowel Obstruction Protocol-initial, 8 Hr Delay  Result Date: 04/16/2019 CLINICAL DATA:  Small bowel obstruction EXAM: PORTABLE ABDOMEN - 1 VIEW COMPARISON:  April 14, 2019 FINDINGS: There is a single loop of borderline dilated small bowel in the right abdomen. There is somewhat of a paucity of bowel gas overall. No air-fluid levels evident. No free air evident. IMPRESSION: Relative paucity of bowel gas. There is air in a loop of bowel in the right mid abdomen which is slightly dilated. The appearance is more suggestive of ileus or enteritis than bowel obstruction, although a  degree of bowel obstruction cannot excluded in this circumstance. No evident free air. Electronically Signed   By: Lowella Grip III M.D.   On: 04/16/2019 19:30   Dg Abd Portable 1v  Result Date: 04/14/2019 CLINICAL DATA:  NG tube placement EXAM: PORTABLE ABDOMEN - 1 VIEW COMPARISON:  CT abdomen pelvis from the same day FINDINGS: An enteric tube terminates in the stomach. The visible lung bases are clear. The bowel gas pattern is nonobstructive. IMPRESSION: Enteric tube terminates in the stomach. Electronically Signed   By: Zerita Boers M.D.   On: 04/14/2019 17:14   [place] Ekg Site Rite  Result Date: 04/20/2019 If Site Rite image not attached, placement could not be confirmed due to current cardiac rhythm.   Labs:  CBC: Recent Labs    04/23/19 0631 04/24/19 0816 04/25/19 0317 04/26/19 0310 04/26/19 1425  WBC 14.7* 16.6* 17.2* 13.7*  --   HGB 8.6* 8.6* 7.9* 4.8* 7.8*  HCT 27.0* 26.6* 24.8* 15.0* 22.3*  PLT 633* 707* 766* 639*  --     COAGS: Recent Labs    04/14/19 1055 04/20/19 1731 04/26/19 0310  INR 1.3* 1.6* 1.4*  APTT 34 29 34    BMP: Recent Labs    04/23/19 0257 04/24/19 0816 04/25/19 0317 04/26/19 0310  NA 144 138 137 135   135  K 3.9 3.6 3.8 3.7   3.7  CL 112* 101 101 100   100  CO2 25 27 29 27   28   GLUCOSE 169* 132* 126* 153*   155*  BUN 19 17 16  21*   21*  CALCIUM 7.8* 7.8* 7.8* 7.4*   7.5*  CREATININE 1.31* 1.23* 1.20* 1.23*   1.25*  GFRNONAA 55* 60* >60 60*   58*  GFRAA >60 >60 >60 >60   >60    LIVER FUNCTION TESTS: Recent Labs    04/20/19 1731 04/21/19 0500 04/22/19 0409 04/26/19 0310  BILITOT 1.3* 0.9 0.4 0.1*   0.3  AST 62* 59* 79* 37   36  ALT 51* 42 46* 31   30  ALKPHOS 48 123 58 65   65  PROT 3.8* 4.0* 3.9* 3.8*   3.8*  ALBUMIN 1.8* 1.4* 1.4* 1.2*   1.2*     Assessment and Plan:  Intraabdominal fluid collection concerning for abscess. Plan for image-guided intraabdominal abscess aspiration/drain placement tentatively for  tomorrow 04/27/2019 in IR. Patient will be NPO at midnight. Afebrile. Hgb up to 7.8 (from 4.8 this AM) post transfusion- will recheck in AM to make sure stable prior to proceeding. Last dose Heparin 04/25/2019. INR 1.4 today.  Risks and benefits discussed with the patient including bleeding, infection, damage to adjacent structures, bowel perforation/fistula connection, and sepsis. All of the patient's questions were answered, patient is agreeable to proceed. Consent signed and in chart.   Thank you for this interesting consult.  I greatly enjoyed meeting [facility] and look forward to participating in their care.  A copy of this report was sent to the requesting provider on this date.  Electronically Signed: Earley Abide,  PA-C 04/26/2019, 3:31 PM   I spent a total of 40 Minutes in face to face in clinical consultation, greater than 50% of which was counseling/coordinating care for intraabdominal abscess/drain placement.

## 2019-04-26 NOTE — Progress Notes (Signed)
Patient is having  Large amount of dark tarry watery stool x 2. Notified Dr. Sheppard Coil . Patient is also complaining of dizziness at this moment.. V/S is stable  With the exception of HR in the 120 s. Patient assisted from the BR to her bed.

## 2019-04-26 NOTE — Progress Notes (Signed)
Rodman Pickle RN increased blood transfusion rate to 300 mL/hr,  stated it was ordered by Dr. Sheppard Coil to increase  the rate. Patient is tolerating the rate change without any distress or SOB.  Will continue to monitor.

## 2019-04-26 NOTE — Significant Event (Signed)
Rapid Response Event Note  Overview: Acute blood loss from GIB  Initial Focused Assessment: Called by nursing staff regarding pt having large dark melenic BM. Pt denies CP, SOB and stated she had abd cramping when having BM. Pt is alert, oriented x4. Skin is jaundice, pale and dry. Abd is slightly distended with mid abd dressing which is CDI. Right Abd drain with serosangineous drainage. Last Hgb 7.9 on 11/1. HR 132, BP 136/90, RR 16 with sats 100% on 2L [place]. Dr. Shan Levans and Dr. Sheppard Coil came to the bedside. Dr. Kieth Brightly paged and awaiting response. Orders received for a IV bolus, labs and transfusion of PRBCs.   Interventions: -No acute interventions by myself  Plan of Care (if not transferred): -Monitor for further bleeding -BR for now -Notify primary service and/or RRRN for further assistance if needed  Event Summary: Call received [phone] Arrived at call 0212 Call ended [phone]  Madelynn Done

## 2019-04-26 NOTE — Progress Notes (Addendum)
Paged the surgeon x 2 and Dr. Kieth Brightly responded to the call. No new orders received. Stated nothing  emergent is going to be done at this time since she's stable as well as her v/signs. The surgeon will see her in the morning with plans. Patient is A & O x 4.. Will continue to monitor.

## 2019-04-27 ENCOUNTER — Inpatient Hospital Stay (HOSPITAL_COMMUNITY): Payer: BC Managed Care – PPO

## 2019-04-27 LAB — PREPARE RBC (CROSSMATCH)

## 2019-04-27 LAB — GLUCOSE, CAPILLARY
Glucose-Capillary: 118 mg/dL — ABNORMAL HIGH (ref 70–99)
Glucose-Capillary: 119 mg/dL — ABNORMAL HIGH (ref 70–99)

## 2019-04-27 LAB — CBC
HCT: 18.8 % — ABNORMAL LOW (ref 36.0–46.0)
Hemoglobin: 6.3 g/dL — CL (ref 12.0–15.0)
MCH: 29.4 pg (ref 26.0–34.0)
MCHC: 33.5 g/dL (ref 30.0–36.0)
MCV: 87.9 fL (ref 80.0–100.0)
Platelets: 467 10*3/uL — ABNORMAL HIGH (ref 150–400)
RBC: 2.14 MIL/uL — ABNORMAL LOW (ref 3.87–5.11)
RDW: 14 % (ref 11.5–15.5)
WBC: 11.7 10*3/uL — ABNORMAL HIGH (ref 4.0–10.5)
nRBC: 0 % (ref 0.0–0.2)

## 2019-04-27 LAB — BASIC METABOLIC PANEL
Anion gap: 5 (ref 5–15)
BUN: 19 mg/dL (ref 6–20)
CO2: 27 mmol/L (ref 22–32)
Calcium: 7.8 mg/dL — ABNORMAL LOW (ref 8.9–10.3)
Chloride: 105 mmol/L (ref 98–111)
Creatinine, Ser: 1.17 mg/dL — ABNORMAL HIGH (ref 0.44–1.00)
GFR calc Af Amer: >60 mL/min (ref >60)
GFR calc non Af Amer: >60 mL/min (ref >60)
Glucose, Bld: 120 mg/dL — ABNORMAL HIGH (ref 70–99)
Potassium: 4.2 mmol/L (ref 3.5–5.1)
Sodium: 137 mmol/L (ref 135–145)

## 2019-04-27 LAB — PROTIME-INR
INR: 1.3 — ABNORMAL HIGH (ref 0.8–1.2)
Prothrombin Time: 15.5 seconds — ABNORMAL HIGH (ref 11.4–15.2)

## 2019-04-27 LAB — HEMOGLOBIN AND HEMATOCRIT, BLOOD
HCT: 25.4 % — ABNORMAL LOW (ref 36.0–46.0)
Hemoglobin: 8.6 g/dL — ABNORMAL LOW (ref 12.0–15.0)

## 2019-04-27 MED ORDER — TRAVASOL 10 % IV SOLN
INTRAVENOUS | Status: AC
  Administered 2019-04-27: 17:00:00 via INTRAVENOUS
  Filled 2019-04-27: qty 1044

## 2019-04-27 MED ORDER — ALTEPLASE 2 MG IJ SOLR
2.0000 mg | Freq: Once | INTRAMUSCULAR | Status: AC
  Administered 2019-04-27: 2 mg
  Filled 2019-04-27: qty 2

## 2019-04-27 MED ORDER — MIDAZOLAM HCL 2 MG/2ML IJ SOLN
INTRAMUSCULAR | Status: AC
  Filled 2019-04-27: qty 2

## 2019-04-27 MED ORDER — LIDOCAINE HCL 1 % IJ SOLN
INTRAMUSCULAR | Status: AC
  Filled 2019-04-27: qty 20

## 2019-04-27 MED ORDER — FENTANYL CITRATE (PF) 100 MCG/2ML IJ SOLN
INTRAMUSCULAR | Status: AC | PRN
  Administered 2019-04-27 (×2): 50 ug via INTRAVENOUS

## 2019-04-27 MED ORDER — DEXTROSE 10 % IV SOLN
INTRAVENOUS | Status: DC
  Administered 2019-04-28: 04:00:00 75 mL/h via INTRAVENOUS

## 2019-04-27 MED ORDER — LACTATED RINGERS IV SOLN
INTRAVENOUS | Status: DC
  Administered 2019-04-27 – 2019-04-28 (×2): via INTRAVENOUS

## 2019-04-27 MED ORDER — FENTANYL CITRATE (PF) 100 MCG/2ML IJ SOLN
INTRAMUSCULAR | Status: AC
  Filled 2019-04-27: qty 2

## 2019-04-27 MED ORDER — SODIUM CHLORIDE 0.9% FLUSH
5.0000 mL | Freq: Three times a day (TID) | INTRAVENOUS | Status: DC
  Administered 2019-04-27 – 2019-04-30 (×8): 5 mL

## 2019-04-27 MED ORDER — MIDAZOLAM HCL 2 MG/2ML IJ SOLN
INTRAMUSCULAR | Status: AC | PRN
  Administered 2019-04-27 (×2): 1 mg via INTRAVENOUS

## 2019-04-27 MED ORDER — SODIUM CHLORIDE 0.9% IV SOLUTION: Freq: Once | INTRAVENOUS | Status: DC

## 2019-04-27 NOTE — Procedures (Signed)
Interventional Radiology Procedure Note  Procedure: CT guided RLQ abscess drainage.  ~10cc of fluid removed for culture.  38F drain placed Complications: None Recommendations:  - To bulb suction - Do not submerge - Routine drain care   Signed,  Dulcy Fanny. Earleen Newport, DO

## 2019-04-27 NOTE — Progress Notes (Signed)
7 Days Post-Op  Subjective: CC: Patient had 1 maroon colored liquidly stool overnight. She is currently receiving blood. No change in plain since yesterday. She denies n/v.   Objective: Vital signs in last 24 hours: Temp:  [97.7 F (36.5 C)-98.3 F (36.8 C)] 97.7 F (36.5 C) (11/03 0811) Pulse Rate:  [100-120] 102 (11/03 0811) Resp:  [13-23] 15 (11/03 0811) BP: (111-131)/(72-99) 129/89 (11/03 0811) SpO2:  [96 %-100 %] 100 % (11/03 0811) Last BM Date: 04/25/19  Intake/Output from previous day: 11/02 0701 - 11/03 0700 In: 912 [Blood:912] Out: 106 [Urine:100; Drains:5; Stool:1] Intake/Output this shift: No intake/output data recorded.  PE: Gen:  Alert, NAD, pleasant Card:  Tachycardic with regular rhythm  Pulm:  CTA b/l, no W/R/R, effort normal Abd: Mild distension, tenderness of the RLQ > LLQ, hypoactive bowel sounds. Midline wound with dressing in place, clean and dry with healthy granulation tissue and some fibrinous tissue at base. No evidence of drainage or active wound infection. No dehiscence. Drain with SS output 5cc/24 hours  Ext:  2+ LE edema Psych: A&Ox3  Skin: no rashes noted, warm and dry     Lab Results:  Recent Labs    04/26/19 0310 04/26/19 1425 04/27/19 0541  WBC 13.7*  --  11.7*  HGB 4.8* 7.8* 6.3*  HCT 15.0* 22.3* 18.8*  PLT 639*  --  467*   BMET Recent Labs    04/26/19 0310 04/27/19 0646  NA 135   135 137  K 3.7   3.7 4.2  CL 100   100 105  CO2 27   28 27   GLUCOSE 153*   155* 120*  BUN 21*   21* 19  CREATININE 1.23*   1.25* 1.17*  CALCIUM 7.4*   7.5* 7.8*   PT/INR Recent Labs    04/26/19 0310 04/27/19 0541  LABPROT 16.9* 15.5*  INR 1.4* 1.3*   CMP     Component Value Date/Time   NA 137 04/27/2019 0646   NA 127 (L) 12/22/2017 1449   K 4.2 04/27/2019 0646   CL 105 04/27/2019 0646   CO2 27 04/27/2019 0646   GLUCOSE 120 (H) 04/27/2019 0646   BUN 19 04/27/2019 0646   BUN 9 12/22/2017 1449   CREATININE 1.17 (H)  04/27/2019 0646   CALCIUM 7.8 (L) 04/27/2019 0646   PROT 3.8 (L) 04/26/2019 0310   PROT 3.8 (L) 04/26/2019 0310   PROT 6.8 12/22/2017 1449   ALBUMIN 1.2 (L) 04/26/2019 0310   ALBUMIN 1.2 (L) 04/26/2019 0310   ALBUMIN 5.0 12/22/2017 1449   AST 36 04/26/2019 0310   AST 37 04/26/2019 0310   ALT 30 04/26/2019 0310   ALT 31 04/26/2019 0310   ALKPHOS 65 04/26/2019 0310   ALKPHOS 65 04/26/2019 0310   BILITOT 0.3 04/26/2019 0310   BILITOT 0.1 (L) 04/26/2019 0310   BILITOT 0.2 12/22/2017 1449   GFRNONAA >60 04/27/2019 0646   GFRAA >60 04/27/2019 0646   Lipase     Component Value Date/Time   LIPASE 18 04/14/2019 0846       Studies/Results: Ct Abdomen Pelvis W Contrast  Result Date: 04/25/2019 CLINICAL DATA:  Postop from small bowel resection for necrotic bowel. Worsening leukocytosis and tachycardia. EXAM: CT ABDOMEN AND PELVIS WITH CONTRAST TECHNIQUE: Multidetector CT imaging of the abdomen and pelvis was performed using the standard protocol following bolus administration of intravenous contrast. CONTRAST:  152mL OMNIPAQUE IOHEXOL 300 MG/ML  SOLN COMPARISON:  Noncontrast CT on 04/19/2019 FINDINGS: Lower Chest:  Increased size of moderate left and small right pleural effusions. Increased bilateral lower lobe atelectasis. Hepatobiliary: No hepatic masses identified. Gallbladder is unremarkable. No evidence of biliary ductal dilatation. Pancreas:  No mass or inflammatory changes. Spleen: Within normal limits in size and appearance. Adrenals/Urinary Tract: No masses identified. No evidence of hydronephrosis. Stomach/Bowel: Surgical drain is seen in place. Small amount of free intraperitoneal air is attributable to recent surgery. No evidence of bowel obstruction. Mild diffuse small bowel wall thickening, mesenteric inflammatory changes, and diffuse body wall edema. Mild ascites is seen as well as diffuse peritoneal enhancement. Several rim enhancing fluid collections are seen which are located in  the left lower quadrant measuring 5.1 cm, right lower quadrant measuring 5.1 cm, and pelvic cul-de-sac measuring 6.4 cm, which are consistent with abscesses. Vascular/Lymphatic: No pathologically enlarged lymph nodes. No abdominal aortic aneurysm. Mesenteric vessels are patent. Reproductive:  No mass or other significant abnormality. Other:  None. Musculoskeletal:  No suspicious bone lesions identified. IMPRESSION: Mild ascites and diffuse peritoneal enhancement, consistent with peritonitis. Small amount of postop free air. Several rim enhancing fluid collections in bilateral lower quadrants and pelvic cul-de-sac, largest measuring 6.4 cm, consistent with abscesses. Diffuse bowel wall thickening, mesenteric inflammatory changes, and diffuse body wall edema. No evidence of bowel obstruction. Increased size of moderate left and small right pleural effusions, and bilateral lower lobe atelectasis. Electronically Signed   By: Danae Orleans M.D.   On: 04/25/2019 13:16    Anti-infectives: Anti-infectives (From admission, onward)   Start     Dose/Rate Route Frequency Ordered Stop   04/20/19 1430  fluconazole (DIFLUCAN) IVPB 200 mg     200 mg 100 mL/hr over 60 Minutes Intravenous Every 24 hours 04/20/19 1418 04/24/19 1454   04/19/19 1130  piperacillin-tazobactam (ZOSYN) IVPB 3.375 g     3.375 g 12.5 mL/hr over 240 Minutes Intravenous Every 8 hours 04/19/19 1122     04/15/19 1200  levofloxacin (LEVAQUIN) IVPB 750 mg  Status:  Discontinued     750 mg 100 mL/hr over 90 Minutes Intravenous Every 24 hours 04/15/19 1127 04/15/19 1149   04/15/19 1200  levofloxacin (LEVAQUIN) IVPB 750 mg  Status:  Discontinued     750 mg 100 mL/hr over 90 Minutes Intravenous Every 48 hours 04/15/19 1149 04/19/19 1110   04/14/19 1100  cefTRIAXone (ROCEPHIN) 1 g in sodium chloride 0.9 % 100 mL IVPB  Status:  Discontinued     1 g 200 mL/hr over 30 Minutes Intravenous Every 24 hours 04/14/19 1055 04/15/19 1127        Assessment/Plan Anemia - Hgb 6.3 s/p 2U 11/2. Receiving 2U this morning. Monitor bloody bm's.   SBO 2/2 volvulus of segment of ileum Intraabdominal abscess S/p Ex-Lap with ileocecectomy, SBR, drainage of intra-abdominal abscesses, partial omentectomy, adhesiolysis - Dr. Angelena Form - 04/20/2019 - POD #7 - CT 11/1 w/ rim enhancing fluid collections bilateral lower quadrants. Appreciate IR assistance. Per notes, they feels RLQ fluid collection is amenable to perc catheter drainage.  - Continue IV abx  FEN - NPO VTE - SCDs, chemical prophylaxis on hold ID - Zosyn 10/26 >>   LOS: 13 days    Jacinto Halim , [facility] 04/27/2019, 8:45 AM Please see Amion for pager number during day hours 7:00am-4:30pm

## 2019-04-27 NOTE — Progress Notes (Signed)
   Subjective:  Patient had one bowel movement overnight which was dark. Made aware of low hemoglobin and plan to transfuse this morning. Discussed plan for IR to take her for drain placement today. Patient continues to have some mid back pain, she first mentioned this pain yesterday. Pain is worse on the left , but bilateral. No worsened with movement or palpation.   Objective:  Vital signs in last 24 hours: Vitals:   04/26/19 2156 04/26/19 2256 04/26/19 2257 04/27/19 0054  BP: 111/72 117/76    Pulse:      Resp: 13 14    Temp:    98.2 F (36.8 C)  TempSrc:    Oral  SpO2:   100%   Weight:      Height:        Physical Exam Gen: NAD, well developed Cardiovascular: Tachycardic, regular rythm, no mrg Pulmonary: No w/r/r . CTAB. Pain with deep breathing Abdominal: Nl bowel sounds, mild abdominal tenderness. Bandages in place. Musculoskeletal: Tenderness in mid back , bilateral. No worsened by palpation. No tenderness over spine    Assessment/Plan:  Principal Problem:   SBO (small bowel obstruction) (HCC) Active Problems:   Hypothyroidism (acquired)   Amenorrhea   Acute renal injury (HCC)   Elevated TSH   Elevated serum free T4 level   Hyponatremia   Leukocytosis   Anemia   Respiratory failure, acute ([place])  28 year old female with a pPMHx of anorexia,amenorrhea(hypothalamic hypogonadism),Hx ofMeckel'sdiverticulum (s/p small bowel resection)who presented with SBO underwent surgical intervention after failing conservative management. Found to have necrotic small bowel requiring resection, volvulus, and abscesses requiring drainage.Clinical course also complicated byAKI andupper GI bleed.On 11/1 patient found to have new abdominal abscesses and had a large blood bowel movement overnight.   #Acute blood loss anemia 2/2 GI bleed Hgb 4.4 on 11/2, Hgb responded to 7.8 after transfused 4 units. Hgb 6.3 on 11/3 am. One bloody ( dark stool) bowel movement since 1pm yesterday.  Will continue to monitor. -Transfuse 2 units RBC.  - Appreciate general surgery recommendations   #Intrabdominal Infection/Abscess S/p Ex Lap with ileocecectomy with drainage of intra-abdominal abscess and partial omentectomy - POD#7; CT abdomen pelvis show large abdominal abscesses. Patient reports mid back pain for 2 days, will monitor and see if patients pain changes with drain placement. Gen surgery and IR are following.  - Continue IV Hydromorphone 1 mg q2hrs for sever pain - continuing IV zosyn , on day 9  - she is on TPN for nutrition and clear liquid diet - Plan for IR to place drain(s) 11/3.  - appreciate general surgery recommendations - appreciate IR surgery recommendations   #AKI - secondary to volume depletion with n/v and poor PO intake . Baseline Cr ~.7. Elevated to 3.83 on 10/21 , downtrend and 1.17 today, improved with IV fluids. Will continue to monitor diuresis.  - Strict I/O's - Maintenance fluids while NPO - continue to trend BMP - In/out cath, consider placing foley if patient has another bladder scan >300 ml   Dispo: Anticipated discharge pending clinical improvement.   Tamsen Snider, MD PGY1  [phone]

## 2019-04-27 NOTE — Progress Notes (Addendum)
CRITICAL VALUE ALERT  Critical Value:  Hgb 6.3  Date & Time Notied:  04/27/2019 at 0635  Provider Notified: Alexander,MD with Internal Medicine Teaching Services  Orders Received/Actions taken: Awaiting orders at this time.  Alexander,MD called at [phone] stating that he had ordered 2 units of PRBCs. Patient's RN Yvetta Coder made aware.

## 2019-04-27 NOTE — Progress Notes (Signed)
PICC dressing change: Site unremarkable except for faint biopatch shaped bruise. IV tubing was caught in pt's abdominal dressing. Discussed with Lowella Dandy and Advertising copywriter to keep tubing routed away from dressings. Pt reported PICC was wrapped w Kerlex dressing due to beeping. Stat lock re-positioned laterally to avoid both issues.

## 2019-04-27 NOTE — Progress Notes (Signed)
Writer @ bedside for left TL PICC assessment for IV pump alarm " occluded". All lumens of TL PICC flushed with moderate resistance. Discussed with VAST PICC RN's whom agreed TPA of all lumens is appropriate. Discussed with primary care RN & MD notified. Orders received for D10 to be hung after line assessment post TPA. PIV placed in RFA as patient with routine pain medications. VAST RN will reassess all lumens of PICC ~ 2 hours post TPA instillation. Primary care RN aware of plan.

## 2019-04-27 NOTE — Sedation Documentation (Signed)
Pt C/O severe pain in abdomen.  Notified Dr. Earleen Newport, received order to administer 44mcg fentanyl IV now

## 2019-04-27 NOTE — Progress Notes (Addendum)
PHARMACY - ADULT TOTAL PARENTERAL NUTRITION CONSULT NOTE  Pharmacy Consult: TPN Indication: SBO  Patient Measurements: Height: 5\' 6"  (167.6 cm) Weight: 162 lb 0.6 oz (73.5 kg) IBW/kg (Calculated) : 59.3 TPN AdjBW (KG): 60.4 Body mass index is 26.15 kg/m. Usual Weight: TBD  Assessment:  14 YOF presenting epigastric abd pain, nausea and vomiting.  Distention despite having semi-solid stool and tolerated clear liquid diet on 10/26, however CT shows SBO and for exlap 10/27> found sbo (intraabdominal abscesses), had small bowel recestion + drainage of abscesses and partial omentectomy.  Resolving hx of anorexia, has gained weight since this summer.  Albumin low this admission, at risk for refeeding syndrome.    GI: prealbumin low 5.9, NG removed 10/30, drain O/P 58mL.  LBM 11/2 (bloody).  PPI IV BID, PRN Zofran Clear liquid diet 11/2, NPO for abscess drainage 11/3 Endo: no hx DM - CBGs controlled.  SSI D/C'ed 04/25/19. Lytes: all WNL except Phos 5.1 (Ca x Phos = 51, goal < 55) Renal: SCr down 1.17, BUN WNL - UOP 0.1 ml/kg/hr, LR at 75 ml/hr Pulm: extubated to 2L [place] Cards: BP ok, tachy improving Hepatobil: LFTs / tbili WNL, TG 216 >> 151 (TG 730 on 11/2 PM likely an error) Neuro: off Dilaudid PCA, now on PRN ID: Zosyn for IAI and abscess, to be drained 11/3 - afebrile, WBC down 11.7 TPN Access: 04/20/19 TPN start date: 04/21/19  Nutritional Goals (per RD recs 10/28): 1900-2100 kCal, 90-110gm protein, >1.8L fluid per day  Current Nutrition:  TPN  Plan:  TPN at goal rate of 75 ml/hr, providing 104g AA, 324g CHO and 54g ILE for a total of 2059 kCal, meeting 100% of patient needs Electrolytes in TPN: phos reduced 11/2, Cl:Ac 1:1 - no change today Daily multivitamin and trace elements in TPN LR at 75 ml/hr per MD F/U AM labs  Fareeha Evon D. Mina Marble, PharmD, BCPS, [place] 04/27/2019, 8:24 AM

## 2019-04-28 DIAGNOSIS — N179 Acute kidney failure, unspecified: Secondary | ICD-10-CM | POA: Diagnosis not present

## 2019-04-28 DIAGNOSIS — K922 Gastrointestinal hemorrhage, unspecified: Secondary | ICD-10-CM | POA: Diagnosis not present

## 2019-04-28 DIAGNOSIS — D62 Acute posthemorrhagic anemia: Secondary | ICD-10-CM | POA: Diagnosis not present

## 2019-04-28 DIAGNOSIS — Z95828 Presence of other vascular implants and grafts: Secondary | ICD-10-CM

## 2019-04-28 DIAGNOSIS — K651 Peritoneal abscess: Secondary | ICD-10-CM | POA: Diagnosis not present

## 2019-04-28 DIAGNOSIS — R630 Anorexia: Secondary | ICD-10-CM | POA: Diagnosis not present

## 2019-04-28 LAB — TYPE AND SCREEN
ABO/RH(D): A NEG
Antibody Screen: NEGATIVE
Unit division: 0
Unit division: 0
Unit division: 0
Unit division: 0
Unit division: 0
Unit division: 0

## 2019-04-28 LAB — BPAM RBC
Blood Product Expiration Date: 202011082359
Blood Product Expiration Date: 202011282359
Blood Product Expiration Date: 202011282359
Blood Product Expiration Date: 202011282359
Blood Product Expiration Date: 202011292359
Blood Product Expiration Date: 202011302359
ISSUE DATE / TIME: 202011020508
ISSUE DATE / TIME: 202011020812
ISSUE DATE / TIME: 202011020919
ISSUE DATE / TIME: 202011021036
ISSUE DATE / TIME: 202011030750
ISSUE DATE / TIME: 202011031011
Unit Type and Rh: 600
Unit Type and Rh: 600
Unit Type and Rh: 600
Unit Type and Rh: 600
Unit Type and Rh: 600
Unit Type and Rh: 600

## 2019-04-28 LAB — CBC
HCT: 25.6 % — ABNORMAL LOW (ref 36.0–46.0)
Hemoglobin: 8.5 g/dL — ABNORMAL LOW (ref 12.0–15.0)
MCH: 29.9 pg (ref 26.0–34.0)
MCHC: 33.2 g/dL (ref 30.0–36.0)
MCV: 90.1 fL (ref 80.0–100.0)
Platelets: 531 10*3/uL — ABNORMAL HIGH (ref 150–400)
RBC: 2.84 MIL/uL — ABNORMAL LOW (ref 3.87–5.11)
RDW: 13.8 % (ref 11.5–15.5)
WBC: 13.5 10*3/uL — ABNORMAL HIGH (ref 4.0–10.5)
nRBC: 0 % (ref 0.0–0.2)

## 2019-04-28 LAB — BASIC METABOLIC PANEL
Anion gap: 8 (ref 5–15)
BUN: 17 mg/dL (ref 6–20)
CO2: 27 mmol/L (ref 22–32)
Calcium: 8 mg/dL — ABNORMAL LOW (ref 8.9–10.3)
Chloride: 101 mmol/L (ref 98–111)
Creatinine, Ser: 1.23 mg/dL — ABNORMAL HIGH (ref 0.44–1.00)
GFR calc Af Amer: >60 mL/min (ref >60)
GFR calc non Af Amer: 60 mL/min — ABNORMAL LOW (ref >60)
Glucose, Bld: 95 mg/dL (ref 70–99)
Potassium: 4.2 mmol/L (ref 3.5–5.1)
Sodium: 136 mmol/L (ref 135–145)

## 2019-04-28 LAB — TRIGLYCERIDES: Triglycerides: 167 mg/dL — ABNORMAL HIGH (ref <150)

## 2019-04-28 LAB — MAGNESIUM: Magnesium: 1.8 mg/dL (ref 1.7–2.4)

## 2019-04-28 LAB — PHOSPHORUS: Phosphorus: 4.1 mg/dL (ref 2.5–4.6)

## 2019-04-28 LAB — GLUCOSE, CAPILLARY
Glucose-Capillary: 106 mg/dL — ABNORMAL HIGH (ref 70–99)
Glucose-Capillary: 124 mg/dL — ABNORMAL HIGH (ref 70–99)

## 2019-04-28 MED ORDER — BOOST / RESOURCE BREEZE PO LIQD CUSTOM
1.0000 | Freq: Two times a day (BID) | ORAL | Status: DC
  Administered 2019-04-28 – 2019-04-30 (×5): 1 via ORAL

## 2019-04-28 MED ORDER — METHOCARBAMOL 500 MG PO TABS
500.0000 mg | ORAL_TABLET | Freq: Three times a day (TID) | ORAL | Status: DC | PRN
  Administered 2019-04-28 – 2019-04-29 (×4): 500 mg via ORAL
  Filled 2019-04-28 (×4): qty 1

## 2019-04-28 MED ORDER — OXYCODONE HCL 5 MG PO TABS
5.0000 mg | ORAL_TABLET | ORAL | Status: DC | PRN
  Administered 2019-04-28 – 2019-04-30 (×11): 5 mg via ORAL
  Filled 2019-04-28 (×11): qty 1

## 2019-04-28 MED ORDER — PRO-STAT SUGAR FREE PO LIQD
30.0000 mL | Freq: Two times a day (BID) | ORAL | Status: DC
  Administered 2019-04-28: 17:00:00 30 mL via ORAL
  Filled 2019-04-28 (×2): qty 30

## 2019-04-28 MED ORDER — TRAVASOL 10 % IV SOLN
INTRAVENOUS | Status: AC
  Administered 2019-04-28: 17:00:00 via INTRAVENOUS
  Filled 2019-04-28: qty 1044

## 2019-04-28 MED ORDER — HYDROMORPHONE HCL 1 MG/ML IJ SOLN
1.0000 mg | INTRAMUSCULAR | Status: DC | PRN
  Administered 2019-04-28 – 2019-04-30 (×12): 1 mg via INTRAVENOUS
  Filled 2019-04-28 (×12): qty 1

## 2019-04-28 NOTE — Progress Notes (Signed)
8 Days Post-Op   Subjective/Chief Complaint: Had more normal bm, no n/v, no more bleeding, out of bed, got blood and IR drain yesterday   Objective: Vital signs in last 24 hours: Temp:  [97.7 F (36.5 C)-98.9 F (37.2 C)] 98.7 F (37.1 C) (11/04 0021) Pulse Rate:  [91-104] 102 (11/04 0532) Resp:  [12-22] 13 (11/04 0532) BP: (123-139)/(84-97) 131/93 (11/04 0532) SpO2:  [98 %-100 %] 100 % (11/04 0532) Last BM Date: 04/25/19  Intake/Output from previous day: 11/03 0701 - 11/04 0700 In: 1385 [I.V.:905; Blood:480] Out: 675 [Urine:650; Drains:25] Intake/Output this shift: Total I/O In: -  Out: 30 [Drains:30]  GI: wound clean, bs present, drain serous fluid, approp tender  Lab Results:  Recent Labs    04/26/19 0310  04/27/19 0541 04/27/19 1539  WBC 13.7*  --  11.7*  --   HGB 4.8*   < > 6.3* 8.6*  HCT 15.0*   < > 18.8* 25.4*  PLT 639*  --  467*  --    < > = values in this interval not displayed.   BMET Recent Labs    04/27/19 0646 04/28/19 0301  NA 137 136  K 4.2 4.2  CL 105 101  CO2 27 27  GLUCOSE 120* 95  BUN 19 17  CREATININE 1.17* 1.23*  CALCIUM 7.8* 8.0*   PT/INR Recent Labs    04/26/19 0310 04/27/19 0541  LABPROT 16.9* 15.5*  INR 1.4* 1.3*   ABG No results for input(s): PHART, HCO3 in the last 72 hours.  Invalid input(s): PCO2, PO2  Studies/Results: Ct Image Guided Drainage By Percutaneous Catheter  Result Date: 04/27/2019 INDICATION: 28 year old female with a history abscess EXAM: CT GUIDED DRAINAGE OF  ABSCESS MEDICATIONS: The patient is currently admitted to the hospital and receiving intravenous antibiotics. The antibiotics were administered within an appropriate time frame prior to the initiation of the procedure. ANESTHESIA/SEDATION: 2.0 mg IV Versed 100 mcg IV Fentanyl Moderate Sedation Time:  21 minutes The patient was continuously monitored during the procedure by the interventional radiology nurse under my direct supervision.  COMPLICATIONS: None TECHNIQUE: Informed written consent was obtained from the patient after a thorough discussion of the procedural risks, benefits and alternatives. All questions were addressed. Maximal Sterile Barrier Technique was utilized including caps, mask, sterile gowns, sterile gloves, sterile drape, hand hygiene and skin antiseptic. A timeout was performed prior to the initiation of the procedure. PROCEDURE: The operative field was prepped with Chlorhexidine in a sterile fashion, and a sterile drape was applied covering the operative field. A sterile gown and sterile gloves were used for the procedure. Local anesthesia was provided with 1% Lidocaine. Patient positioned supine position on CT gantry table. Scout CT of the abdomen performed for planning purposes. The patient is prepped and draped in the usual sterile fashion. 1% lidocaine was used for local anesthesia. Using modified Seldinger technique, 10 French drain was placed and abscess collection of the right lower quadrant. Sample was aspirated first culture. Catheter was attached to bulb drainage and sutured in position. Patient tolerated the procedure well and remained hemodynamically stable throughout. No complications were encountered and no significant blood loss. FINDINGS: CT demonstrates 8 French drain into right lower quadrant abscess. Approximately 10 cc of fluid aspirated. IMPRESSION: Status post CT-guided drainage of right lower quadrant collection. Signed, Dulcy Fanny. Dellia Nims, RPVI [facility] Electronically Signed   By: Corrie Mckusick D.O.   On: 04/27/2019 15:44    Anti-infectives: Anti-infectives (From  admission, onward)   Start     Dose/Rate Route Frequency Ordered Stop   04/20/19 1430  fluconazole (DIFLUCAN) IVPB 200 mg     200 mg 100 mL/hr over 60 Minutes Intravenous Every 24 hours 04/20/19 1418 04/24/19 1454   04/19/19 1130  piperacillin-tazobactam (ZOSYN) IVPB 3.375  g     3.375 g 12.5 mL/hr over 240 Minutes Intravenous Every 8 hours 04/19/19 1122     04/15/19 1200  levofloxacin (LEVAQUIN) IVPB 750 mg  Status:  Discontinued     750 mg 100 mL/hr over 90 Minutes Intravenous Every 24 hours 04/15/19 1127 04/15/19 1149   04/15/19 1200  levofloxacin (LEVAQUIN) IVPB 750 mg  Status:  Discontinued     750 mg 100 mL/hr over 90 Minutes Intravenous Every 48 hours 04/15/19 1149 04/19/19 1110   04/14/19 1100  cefTRIAXone (ROCEPHIN) 1 g in sodium chloride 0.9 % 100 mL IVPB  Status:  Discontinued     1 g 200 mL/hr over 30 Minutes Intravenous Every 24 hours 04/14/19 1055 04/15/19 1127      Assessment/Plan: POD 8 S/p Ex-Lap with ileocecectomy, SBR, drainage of intra-abdominal abscesses, partial omentectomy, adhesiolysis - Dr. Angelena Form - 04/20/2019  - CT 11/1 w/ rim enhancing fluid collections bilateral lower quadrants. drain placed yesterday - Continue IV abx as below -advance diet as tolerated, once tolerating will wean tpn -pulm toilet, oob -bid wet to dry dressings -will have PT to see her today also ABL Anemia - hct 25.4 from 18.8 yesterday after two units, cbc pending this am, no clinical evidence of more bleeding FEN -fulls, adat VTE -SCDs, chemical prophylaxis on hold will restart in am if cbc fine due to likely anastomotic bleed ID -Zosyn 10/26 >> until abdominal cx is back then can stop if negative  Emelia Loron 04/28/2019

## 2019-04-28 NOTE — Progress Notes (Signed)
Writer @ bedside for TPA removal and morning lab draw. Each lumen with blood return although not brisk.. Each lumen flushes with resistance. Primary care RN updated. Plan to have PICC RN evaluate PICC in am.

## 2019-04-28 NOTE — Progress Notes (Signed)
VAST/PICC RN Aldona Lento @ bedside to assess PICC line. See Doc flow sheet. PICC now working without difficulty. Primary care RN updated.

## 2019-04-28 NOTE — Progress Notes (Addendum)
Parents of patient (Kim Myers). Father will relieve mother. They will alternate

## 2019-04-28 NOTE — Plan of Care (Signed)
  Problem: Nutrition: Goal: Adequate nutrition will be maintained Outcome: Progressing   

## 2019-04-28 NOTE — Evaluation (Signed)
Physical Therapy Evaluation Patient Details Name: Kim Myers MRN: [id number] DOB: 1990-12-31 Today's Date: 04/28/2019   History of Present Illness  28 yo female admitted to ED on 10/21 with abdominal pain and emesis due to SBO. S/p SBR, drainage of intra-abdominal abscesses (with placement of JP drains 11/3), partial omentectomy, ileocecectomy on 10/27, with intubation 10/27-10/28. Pt with history anorexia and amenorrhea x1 year; ex-laparotomy with Meckel's diverticulectomy, LOA and appedectomy Dr. Gerrit Friends in 2011 for a small bowel volvulus, Meckel's diverticulum and incomplete bowel rotration.  She did suffer a liver laceration that was treated nonoperatively in 2018.  Clinical Impression   Pt presents with increased time and effort to perform mobility tasks, tachycardia up to 130 bpm and tachypnea up to 28 breaths/min with mobility, unsteadiness in standing, LE edema, and decreased activity tolerance. Pt to benefit from acute PT to address deficits. Pt ambulated hallway distance with IV pole utilized for steadying, verbal cuing for upright posture as pt with forward trunk flexion due to abdominal discomfort. PT recommending HHPT at this time based on eval, but pt may progress to home with no PT services pending pt progress. Pt educated and instructed in gentle LE exercises to aide in decreasing swelling, and pt encouraged to mobilize in hall with RN staff. PT to progress mobility as tolerated, and will continue to follow acutely.      Follow Up Recommendations Home health PT    Equipment Recommendations  Other (comment)(to be determined)    Recommendations for Other Services       Precautions / Restrictions Precautions Precautions: Fall;Other (comment)(moderate fall risk) Precaution Comments: JP drains, abdominal Restrictions Weight Bearing Restrictions: No      Mobility  Bed Mobility Overal bed mobility: Needs Assistance Bed Mobility: Sit to Supine       Sit to supine: Min  guard;HOB elevated   General bed mobility comments: Min guard for sit to supine, verbal cuing for sequencing and pt with increased time to perform with significant HOB elevation utilized. Pt up in chair upon PT arrival to room.  Transfers Overall transfer level: Needs assistance Equipment used: None Transfers: Sit to/from Stand Sit to Stand: Min assist;From elevated surface         General transfer comment: Light assist for steadying upon standing, pt using IV pole to assist in self-steady.  Ambulation/Gait Ambulation/Gait assistance: Min guard Gait Distance (Feet): 250 Feet Assistive device: IV Pole Gait Pattern/deviations: Step-through pattern;Decreased stride length;Trunk flexed;Antalgic Gait velocity: decr   General Gait Details: Min guard for safety, verbal cuing for upright posture. Pt with bilateral UE support on IV pole for steadying. Pt with mild rocking on feet, pt reports due to feeling unsteady and swelling of LEs.  Stairs            Wheelchair Mobility    Modified Rankin (Stroke Patients Only)       Balance Overall balance assessment: Needs assistance Sitting-balance support: No upper extremity supported;Feet supported Sitting balance-Leahy Scale: Good     Standing balance support: Bilateral upper extremity supported Standing balance-Leahy Scale: Fair Standing balance comment: able to stand without UE support, reliant on UE support during dynamic standing                             Pertinent Vitals/Pain Pain Assessment: 0-10 Pain Score: 7  Pain Location: abdomen, L side Pain Intervention(s): Patient requesting pain meds-RN notified;Limited activity within patient's tolerance;Monitored during session;Repositioned    Home Living  Family/patient expects to be discharged to:: Private residence Living Arrangements: Parent(pt to stay with mother upon d/c, below information refers to pt's mother's house) Available Help at Discharge:  Family;Available 24 hours/day Type of Home: House Home Access: Stairs to enter   [employer] of Steps: 2 Home Layout: One level Home Equipment: None      Prior Function Level of Independence: Independent         Comments: pt reports complete independence with all mobility and ADLs PTA, 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     Hand Dominance   Dominant Hand: Right    Extremity/Trunk Assessment   Upper Extremity Assessment Upper Extremity Assessment: Overall WFL for tasks assessed    Lower Extremity Assessment Lower Extremity Assessment: Overall WFL for tasks assessed    Cervical / Trunk Assessment Cervical / Trunk Assessment: Other exceptions Cervical / Trunk Exceptions: forward flexion at hips due to abdominal pain, able to come to full erect standing with verbal cuing  Communication   Communication: No difficulties  Cognition Arousal/Alertness: Awake/alert Behavior During Therapy: WFL for tasks assessed/performed Overall Cognitive Status: Within Functional Limits for tasks assessed                                        General Comments      Exercises General Exercises - Lower Extremity Ankle Circles/Pumps: AROM;Both;10 reps;Supine Quad Sets: AROM;Both;5 reps;Seated   Assessment/Plan    PT Assessment Patient needs continued PT services  PT Problem List Decreased strength;Decreased mobility;Decreased range of motion;Decreased activity tolerance;Decreased balance;Decreased knowledge of use of DME;Pain;Cardiopulmonary status limiting activity       PT Treatment Interventions DME instruction;Therapeutic activities;Gait training;Therapeutic exercise;Patient/family education;Balance training;Stair training;Functional mobility training    PT Goals (Current goals can be found in the Care Plan section)  Acute Rehab PT Goals Patient Stated Goal: go home without AD PT Goal Formulation: With patient Time For Goal  Achievement: 05/12/19 Potential to Achieve Goals: Good    Frequency Min 3X/week   Barriers to discharge        Co-evaluation               AM-PAC PT "6 Clicks" Mobility  Outcome Measure Help needed turning from your back to your side while in a flat bed without using bedrails?: A Little Help needed moving from lying on your back to sitting on the side of a flat bed without using bedrails?: A Little Help needed moving to and from a bed to a chair (including a wheelchair)?: A Little Help needed standing up from a chair using your arms (e.g., wheelchair or bedside chair)?: A Little Help needed to walk in hospital room?: A Little Help needed climbing 3-5 steps with a railing? : A Little 6 Click Score: 18    End of Session   Activity Tolerance: Patient limited by fatigue;Patient limited by pain Patient left: in bed;with call bell/phone within reach Nurse Communication: Mobility status;Patient requests pain meds(pt needs new SCDs) PT Visit Diagnosis: Difficulty in walking, not elsewhere classified (R26.2);Unsteadiness on feet (R26.81)    Time: 0865-7846 PT Time Calculation (min) (ACUTE ONLY): 29 min   Charges:   PT Evaluation $PT Eval Low Complexity: 1 Low PT Treatments $Gait Training: 8-22 mins        Finis Hendricksen E, PT Acute Rehabilitation Services Pager [phone]  Office [phone]   Laverda Stribling D Anneliese Leblond 04/28/2019, 12:45  PM   

## 2019-04-28 NOTE — Progress Notes (Signed)
Nutrition Follow-up  RD working remotely.   DOCUMENTATION CODES:   Not applicable  INTERVENTION:  - continue TPN per Pharmacist. - will order Boost Breeze BID, each supplement provides 250 kcal and 9 grams of protein. - will order 30 mL Prostat BID, each supplement provides 100 kcal and 15 grams of protein. - continue to encourage PO intakes as tolerated.  * weigh patient today.    NUTRITION DIAGNOSIS:   Inadequate oral intake related to altered GI function as evidenced by other (comment)(need for TPN). -ongoing  GOAL:   Patient will meet greater than or equal to 90% of their needs -met  MONITOR:   PO intake, Supplement acceptance, Diet advancement, Labs, Weight trends, Other (Comment)(TPN regimen)  ASSESSMENT:   28 year old female who presented to the ED on 10/21 with abdominal pain and emesis. PMH of anemia, hypothyroidism, amenorrhea, anorexia, gluten intolerance, s/p open meckels divertivulectomy/appendectomy/partial Ladd's for incomplete bowel rotation in 2011. Pt admitted with SIRS, enteritis, AKI, ileus vs SBO. NG tube placed.  Significant Events: 10/21- admission; NGT placed 10/24- NGT removed; diet advanced to CLD 10/26- NGT replaced 10/27- ex-lap with ileocecectomy, small bowel resection, drainage of intra-abdominal abscess, partial omentectomy, adhesiolysis x 60 minutes; triple lumen PICC placed in L brachial 10/28- extubated 10/30- NGT removed; diet advanced to CLD 11/3- CT-guided RLQ abscess drainage (~10 ml) 11/4- diet advanced to FLD   She has not been weighed since 10/27. Diet advanced at 0722 this morning. The last documented PO intakes were 50% of breakfast on 10/26 and 25% of dinner on 10/30. She is receiving custom TPN at 75 ml/hr which is providing 2059 kcal and 104 grams of protein; meeting 100% estimated nutrition needs.     Labs reviewed; CBG: 106 mg/dl, creatinine: 1.23 mg/dl, Ca: 8 mg/dl. Medications reviewed.  IVF; D10 @ 75 ml/hr (612  kcal).    NUTRITION - FOCUSED PHYSICAL EXAM:  unable to complete at this time  Diet Order:   Diet Order            Diet full liquid Room service appropriate? Yes; Fluid consistency: Thin  Diet effective now              EDUCATION NEEDS:   No education needs have been identified at this time  Skin:  Skin Assessment: Skin Integrity Issues: Skin Integrity Issues:: Incisions Incisions: abdomen (10/27)  Last BM:  11/4  Height:   Ht Readings from Last 1 Encounters:  04/20/19 '5\' 6"'$  (1.676 m)    Weight:   Wt Readings from Last 1 Encounters:  04/20/19 73.5 kg    Ideal Body Weight:  59.1 kg  BMI:  Body mass index is 26.15 kg/m.  Estimated Nutritional Needs:   Kcal:  1900-2100  Protein:  90-110 grams  Fluid:  >/= 1.8 L      Jarome Matin, MS, RD, LDN, [facility] Inpatient Clinical Dietitian Pager # [phone] After hours/weekend pager # [phone]

## 2019-04-28 NOTE — Progress Notes (Signed)
Referring Physician(s): Dr Esmond Harps  Supervising Physician: Simonne Come  Patient Status:  [facility] - In-pt  Chief Complaint:  POD 8 S/p Ex-Lap with ileocecectomy, SBR, drainage of intra-abdominal abscesses, partial omentectomy, adhesiolysis - Dr. Angelena Form - 04/20/2019  Subjective:  Procedure: yesterday:  CT guided RLQ abscess drainage.  ~10cc of fluid removed for culture.  72F drain placed  Pt up in chair - eating Breakfast Pain is still an "8" Feeling some less pressure- less pain per pt 25 cc OP yesterday 30 cc recorded this am   Allergies: Gluten meal, 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], Food, and Peanut-containing drug products  Medications: Prior to Admission medications   Not on File     Vital Signs: BP (!) 131/93    Pulse (!) 102    Temp 98.7 F (37.1 C)    Resp 13    Ht 5\' 6"  (1.676 m)    Wt 162 lb 0.6 oz (73.5 kg)    SpO2 100%    BMI 26.15 kg/m   Physical Exam Vitals signs reviewed.  Abdominal:     General: Bowel sounds are decreased.     Palpations: Abdomen is soft.     Tenderness: There is generalized abdominal tenderness and tenderness in the right lower quadrant and left lower quadrant.  Skin:    General: Skin is warm and dry.     Comments: Site is clean and dry No bleeding IR drain intact Pale yellow fluid in JP  ~10 cc Surgery drain intact Cxs pending  Neurological:     Mental Status: She is alert.     Imaging: Ct Abdomen Pelvis W Contrast  Result Date: 04/25/2019 CLINICAL DATA:  Postop from small bowel resection for necrotic bowel. Worsening leukocytosis and tachycardia. EXAM: CT ABDOMEN AND PELVIS WITH CONTRAST TECHNIQUE: Multidetector CT imaging of the abdomen and pelvis was performed using the standard protocol following bolus administration of intravenous contrast. CONTRAST:  13/06/2018 OMNIPAQUE IOHEXOL 300 MG/ML  SOLN COMPARISON:  Noncontrast CT on 04/19/2019 FINDINGS: Lower Chest: Increased size of moderate left and small right pleural  effusions. Increased bilateral lower lobe atelectasis. Hepatobiliary: No hepatic masses identified. Gallbladder is unremarkable. No evidence of biliary ductal dilatation. Pancreas:  No mass or inflammatory changes. Spleen: Within normal limits in size and appearance. Adrenals/Urinary Tract: No masses identified. No evidence of hydronephrosis. Stomach/Bowel: Surgical drain is seen in place. Small amount of free intraperitoneal air is attributable to recent surgery. No evidence of bowel obstruction. Mild diffuse small bowel wall thickening, mesenteric inflammatory changes, and diffuse body wall edema. Mild ascites is seen as well as diffuse peritoneal enhancement. Several rim enhancing fluid collections are seen which are located in the left lower quadrant measuring 5.1 cm, right lower quadrant measuring 5.1 cm, and pelvic cul-de-sac measuring 6.4 cm, which are consistent with abscesses. Vascular/Lymphatic: No pathologically enlarged lymph nodes. No abdominal aortic aneurysm. Mesenteric vessels are patent. Reproductive:  No mass or other significant abnormality. Other:  None. Musculoskeletal:  No suspicious bone lesions identified. IMPRESSION: Mild ascites and diffuse peritoneal enhancement, consistent with peritonitis. Small amount of postop free air. Several rim enhancing fluid collections in bilateral lower quadrants and pelvic cul-de-sac, largest measuring 6.4 cm, consistent with abscesses. Diffuse bowel wall thickening, mesenteric inflammatory changes, and diffuse body wall edema. No evidence of bowel obstruction. Increased size of moderate left and small right pleural effusions, and bilateral lower lobe atelectasis. Electronically Signed   By: 04/21/2019 M.D.   On: 04/25/2019 13:16   Ct  Image Guided Drainage By Percutaneous Catheter  Result Date: 04/27/2019 INDICATION: 28 year old female with a history abscess EXAM: CT GUIDED DRAINAGE OF  ABSCESS MEDICATIONS: The patient is currently admitted to the  hospital and receiving intravenous antibiotics. The antibiotics were administered within an appropriate time frame prior to the initiation of the procedure. ANESTHESIA/SEDATION: 2.0 mg IV Versed 100 mcg IV Fentanyl Moderate Sedation Time:  21 minutes The patient was continuously monitored during the procedure by the interventional radiology nurse under my direct supervision. COMPLICATIONS: None TECHNIQUE: Informed written consent was obtained from the patient after a thorough discussion of the procedural risks, benefits and alternatives. All questions were addressed. Maximal Sterile Barrier Technique was utilized including caps, mask, sterile gowns, sterile gloves, sterile drape, hand hygiene and skin antiseptic. A timeout was performed prior to the initiation of the procedure. PROCEDURE: The operative field was prepped with Chlorhexidine in a sterile fashion, and a sterile drape was applied covering the operative field. A sterile gown and sterile gloves were used for the procedure. Local anesthesia was provided with 1% Lidocaine. Patient positioned supine position on CT gantry table. Scout CT of the abdomen performed for planning purposes. The patient is prepped and draped in the usual sterile fashion. 1% lidocaine was used for local anesthesia. Using modified Seldinger technique, 10 French drain was placed and abscess collection of the right lower quadrant. Sample was aspirated first culture. Catheter was attached to bulb drainage and sutured in position. Patient tolerated the procedure well and remained hemodynamically stable throughout. No complications were encountered and no significant blood loss. FINDINGS: CT demonstrates 4710 French drain into right lower quadrant abscess. Approximately 10 cc of fluid aspirated. IMPRESSION: Status post CT-guided drainage of right lower quadrant collection. Signed, Yvone NeuJaime S. Reyne DumasWagner, DO, RPVI [facility] Electronically  Signed   By: Gilmer MorJaime  Wagner D.O.   On: 04/27/2019 15:44    Labs:  CBC: Recent Labs    04/24/19 0816 04/25/19 0317 04/26/19 0310 04/26/19 1425 04/27/19 0541 04/27/19 1539  WBC 16.6* 17.2* 13.7*  --  11.7*  --   HGB 8.6* 7.9* 4.8* 7.8* 6.3* 8.6*  HCT 26.6* 24.8* 15.0* 22.3* 18.8* 25.4*  PLT 707* 766* 639*  --  467*  --     COAGS: Recent Labs    04/14/19 1055 04/20/19 1731 04/26/19 0310 04/27/19 0541  INR 1.3* 1.6* 1.4* 1.3*  APTT 34 29 34  --     BMP: Recent Labs    04/25/19 0317 04/26/19 0310 04/27/19 0646 04/28/19 0301  NA 137 135   135 137 136  K 3.8 3.7   3.7 4.2 4.2  CL 101 100   100 105 101  CO2 29 27   28 27 27   GLUCOSE 126* 153*   155* 120* 95  BUN 16 21*   21* 19 17  CALCIUM 7.8* 7.4*   7.5* 7.8* 8.0*  CREATININE 1.20* 1.23*   1.25* 1.17* 1.23*  GFRNONAA >60 60*   58* >60 60*  GFRAA >60 >60   >60 >60 >60    LIVER FUNCTION TESTS: Recent Labs    04/20/19 1731 04/21/19 0500 04/22/19 0409 04/26/19 0310  BILITOT 1.3* 0.9 0.4 0.1*   0.3  AST 62* 59* 79* 37   36  ALT 51* 42 46* 31   30  ALKPHOS 48 123 58 65   65  PROT 3.8* 4.0* 3.9* 3.8*   3.8*  ALBUMIN 1.8* 1.4* 1.4* 1.2*   1.2*  Assessment and Plan:  RLQ abscess drain form IR- placed yesterday Intact Will follow Plan per CCS  Electronically Signed: Lavonia Drafts, PA-C 04/28/2019, 9:18 AM   I spent a total of 15 Minutes at the the patient's bedside AND on the patient's hospital floor or unit, greater than 50% of which was counseling/coordinating care for RLQ abscess drain

## 2019-04-28 NOTE — Progress Notes (Signed)
Patient's heart rate elevated during ambulation in hallway. Patient was briefly in the 150's and quickly returned to 130's to 140's. Patient now resting in bed in sinus tachycardia, low 100's. Will continue to monitor.

## 2019-04-28 NOTE — Progress Notes (Addendum)
   Subjective:  TPA of PICC line last night, working well after.   Pt reports her abdominal pain is improving. She is feeling much better today overall and feels less swollen. She is tolerating her clear liquid diet and would like to advance the diet.    Objective:  Vital signs in last 24 hours: Vitals:   04/27/19 2305 04/28/19 0021 04/28/19 0431 04/28/19 0532  BP: 132/88  (!) 131/92 (!) 131/93  Pulse: (!) 101  100 (!) 102  Resp: 14  15 13   Temp:  98.7 F (37.1 C)    TempSrc:      SpO2: 98%  100% 100%  Weight:      Height:        Physical Exam Gen: NAD, well developed Cardiovascular: Tachycardic, regular rythm, no mrg Pulmonary: No w/r/r . CTAB. Pain with deep breathing Abdominal: Nl bowel sounds, mild abdominal tenderness. Clean bandages in place. 2 drains draining serosanguinous fluid     Assessment/Plan:  Principal Problem:   SBO (small bowel obstruction) (HCC) Active Problems:   Hypothyroidism (acquired)   Amenorrhea   Acute renal injury (HCC)   Elevated TSH   Elevated serum free T4 level   Hyponatremia   Leukocytosis   Anemia   Respiratory failure, acute ([place])  28 year old female with a pPMHx of anorexia,amenorrhea(hypothalamic hypogonadism),Hx ofMeckel'sdiverticulum (s/p small bowel resection)who presented with SBO underwent surgical intervention after failing conservative management. Found to have necrotic small bowel requiring resection, volvulus, and abscesses requiring drainage.Clinical course also complicated byAKI andupper GI bleed.On 11/1 patient found to have new abdominal abscesses and had a large blood bowel movement overnight.   #Acute blood loss anemia 2/2 GI bleed  - General surgery following, blood loss likely due to anastomotic bleed.  - Hgb 4.4 on 11/2, Hgb responded to 7.8 after transfused 4 units. Hgb 6.3 on 11/3 with one bowel movement overnight. Transfused 2 units RBC. Post transfusion Hgb 8.6 - No bloody bowel movements  overnight. Patient asymtpomatic, Hgb stable this morning at 8.5. - PT   #Intrabdominal Infection/Abscess S/p Ex Lap with ileocecectomy with drainage of intra-abdominal abscess and partial omentectomy - POD#8; CT abdomen pelvis show large abdominal abscesses. IR placed drain in RLQ abscess, removed 10cc and sent for culture, NGTD. Pain has changed in quality, better,  and surgery switched patient to PO pain regimen. -  Gen surgery and IR are following.  - PRN Oxycodone 5 mg q4hrs  - continuing IV zosyn , on day 10 . Pending Cx of abscess - She is on TPN for nutrition and advanced to full liquid diet today  - Follow cultures of abscess  #AKI - (resolved) Secondary to volume depletion with n/v and poor PO intake . Baseline Cr ~.7. Elevated to 3.83 on 10/21 Down trended, stable ~ 1.2  - Strict I/O's - Maintenance fluids  - continue to trend BMP  Dispo: Anticipated discharge pending clinical improvement.   Tamsen Snider, MD PGY1  [phone]

## 2019-04-28 NOTE — Progress Notes (Signed)
PHARMACY - ADULT TOTAL PARENTERAL NUTRITION CONSULT NOTE  Pharmacy Consult: TPN Indication: SBO  Patient Measurements: Height: 5\' 6"  (167.6 cm) Weight: 162 lb 0.6 oz (73.5 kg) IBW/kg (Calculated) : 59.3 TPN AdjBW (KG): 60.4 Body mass index is 26.15 kg/m. Usual Weight: TBD  Assessment:  7 YOF presenting epigastric abd pain, nausea and vomiting.  Distention despite having semi-solid stool and tolerated clear liquid diet on 10/26, however CT shows SBO and for exlap 10/27> found sbo (intraabdominal abscesses), had small bowel recestion + drainage of abscesses and partial omentectomy. 11/3 drain placed for fluid collections. Resolving hx of anorexia, has gained weight since this summer. Advancing diet   GI: prealbumin low 5.9, NG removed 10/30, drain O/P 69mL.  LBM 11/2 (bloody).  PPI IV BID, PRN Zofran Clear liquid diet 11/2, NPO for abscess drainage 11/3 Endo: no hx DM - CBGs controlled on TPN; SSI D/C'ed 04/25/19. Lytes: all WNL  Renal: SCr down to ~1.2, BUN WNL - UOP 0.4 ml/kg/hr, LR at 75 ml/hr Pulm: extubated to 2L [place] Cards: BP slight high, tachy improving ~100 Hepatobil: LFTs / tbili WNL, TG 216 >> 151 (TG 730 on 11/2 PM likely an error) Neuro: off Dilaudid PCA, now on PRN ID: Zosyn for IAI and abscess, drain placed 11/3, 41ml output; afebrile, WBC 13.5 TPN Access: 04/20/19 TPN start date: 04/21/19  Nutritional Goals (per RD recs 10/28): 1900-2100 kCal, 90-110gm protein, >1.8L fluid per day  Current Nutrition:  TPN, full liquid   Plan:  TPN at goal rate of 75 ml/hr, providing 104g AA, 324g CHO and 54g ILE for a total of 2059 kCal, meeting 100% of patient needs Electrolytes in TPN: increase Na and mag slightly Daily multivitamin and trace elements in TPN LR at 75 ml/hr per MD F/U Mon/Thr labs  Benetta Spar, PharmD, BCPS, [facility] Clinical Pharmacist  Please check AMION for all [place] phone numbers After 10:00 PM, call [place]

## 2019-04-29 DIAGNOSIS — N179 Acute kidney failure, unspecified: Secondary | ICD-10-CM | POA: Diagnosis not present

## 2019-04-29 DIAGNOSIS — K651 Peritoneal abscess: Secondary | ICD-10-CM | POA: Diagnosis not present

## 2019-04-29 DIAGNOSIS — D62 Acute posthemorrhagic anemia: Secondary | ICD-10-CM | POA: Diagnosis not present

## 2019-04-29 DIAGNOSIS — K922 Gastrointestinal hemorrhage, unspecified: Secondary | ICD-10-CM | POA: Diagnosis not present

## 2019-04-29 LAB — CBC
HCT: 24.1 % — ABNORMAL LOW (ref 36.0–46.0)
Hemoglobin: 7.9 g/dL — ABNORMAL LOW (ref 12.0–15.0)
MCH: 29.7 pg (ref 26.0–34.0)
MCHC: 32.8 g/dL (ref 30.0–36.0)
MCV: 90.6 fL (ref 80.0–100.0)
Platelets: 540 10*3/uL — ABNORMAL HIGH (ref 150–400)
RBC: 2.66 MIL/uL — ABNORMAL LOW (ref 3.87–5.11)
RDW: 13.6 % (ref 11.5–15.5)
WBC: 11.9 10*3/uL — ABNORMAL HIGH (ref 4.0–10.5)
nRBC: 0 % (ref 0.0–0.2)

## 2019-04-29 LAB — COMPREHENSIVE METABOLIC PANEL
ALT: 44 U/L (ref 0–44)
AST: 34 U/L (ref 15–41)
Albumin: 1.5 g/dL — ABNORMAL LOW (ref 3.5–5.0)
Alkaline Phosphatase: 112 U/L (ref 38–126)
Anion gap: 9 (ref 5–15)
BUN: 15 mg/dL (ref 6–20)
CO2: 26 mmol/L (ref 22–32)
Calcium: 8 mg/dL — ABNORMAL LOW (ref 8.9–10.3)
Chloride: 101 mmol/L (ref 98–111)
Creatinine, Ser: 1.45 mg/dL — ABNORMAL HIGH (ref 0.44–1.00)
GFR calc Af Amer: 57 mL/min — ABNORMAL LOW (ref >60)
GFR calc non Af Amer: 49 mL/min — ABNORMAL LOW (ref >60)
Glucose, Bld: 131 mg/dL — ABNORMAL HIGH (ref 70–99)
Potassium: 4 mmol/L (ref 3.5–5.1)
Sodium: 136 mmol/L (ref 135–145)
Total Bilirubin: 0.4 mg/dL (ref 0.3–1.2)
Total Protein: 4.9 g/dL — ABNORMAL LOW (ref 6.5–8.1)

## 2019-04-29 LAB — GLUCOSE, CAPILLARY
Glucose-Capillary: 106 mg/dL — ABNORMAL HIGH (ref 70–99)
Glucose-Capillary: 119 mg/dL — ABNORMAL HIGH (ref 70–99)
Glucose-Capillary: 119 mg/dL — ABNORMAL HIGH (ref 70–99)
Glucose-Capillary: 128 mg/dL — ABNORMAL HIGH (ref 70–99)
Glucose-Capillary: 77 mg/dL (ref 70–99)
Glucose-Capillary: 86 mg/dL (ref 70–99)

## 2019-04-29 LAB — PHOSPHORUS: Phosphorus: 5 mg/dL — ABNORMAL HIGH (ref 2.5–4.6)

## 2019-04-29 LAB — MAGNESIUM: Magnesium: 2.1 mg/dL (ref 1.7–2.4)

## 2019-04-29 MED ORDER — TRAVASOL 10 % IV SOLN
INTRAVENOUS | Status: DC
  Administered 2019-04-29: 17:00:00 via INTRAVENOUS
  Filled 2019-04-29: qty 487.2

## 2019-04-29 MED ORDER — PANTOPRAZOLE SODIUM 40 MG PO TBEC
40.0000 mg | DELAYED_RELEASE_TABLET | Freq: Two times a day (BID) | ORAL | Status: DC
  Administered 2019-04-29 – 2019-04-30 (×2): 40 mg via ORAL
  Filled 2019-04-29 (×2): qty 1

## 2019-04-29 NOTE — Discharge Instructions (Signed)
MIDLINE WOUND CARE: - midline dressing to be changed twice daily - supplies: sterile saline, kerlix, scissors, ABD pads, tape  - remove dressing and all packing carefully, moistening with sterile saline as needed to avoid packing/internal dressing sticking to the wound. - clean edges of skin around the wound with water/gauze, making sure there is no tape debris or leakage left on skin that could cause skin irritation or breakdown. - dampen and clean kerlix with sterile saline and pack wound from wound base to skin level, making sure to take note of any possible areas of wound tracking, tunneling and packing appropriately. Wound can be packed loosely. Trim kerlix to size if a whole kerlix is not required. - cover wound with a dry ABD pad and secure with tape.  - write the date/time on the dry dressing/tape to better track when the last dressing change occurred. - apply any skin protectant/powder recommended by clinician to protect skin/skin folds. - change dressing as needed if leakage occurs, wound gets contaminated, or patient requests to shower. - patient may shower daily with wound open and following the shower the wound should be dried and a clean dressing placed.   CC[facility]      [facility] [phone]  OPEN ABDOMINAL SURGERY: POST OP INSTRUCTIONS  Always review your discharge instruction sheet given to you by the facility where your surgery was performed.  IF YOU HAVE DISABILITY OR FAMILY LEAVE FORMS, YOU MUST BRING THEM TO THE OFFICE FOR PROCESSING.  PLEASE DO NOT GIVE THEM TO YOUR DOCTOR.  1. A prescription for pain medication may be given to you upon discharge.  Take your pain medication as prescribed, if needed.  If narcotic pain medicine is not needed, then you may take acetaminophen (Tylenol) or ibuprofen (Advil) as needed. 2. Take your usually prescribed medications unless otherwise directed. 3. If you need a refill on your pain medication, please contact your  pharmacy. They will contact our office to request authorization.  Prescriptions will not be filled after 5pm or on week-ends. 4. You should follow a light diet the first few days after arrival home, such as soup and crackers, pudding, etc.unless your doctor has advised otherwise. A high-fiber, low fat diet can be resumed as tolerated.   Be sure to include lots of fluids daily. Most patients will experience some swelling and bruising on the chest and neck area.  Ice packs will help.  Swelling and bruising can take several days to resolve 5. Most patients will experience some swelling and bruising in the area of the incision. Ice pack will help. Swelling and bruising can take several days to resolve..  6. It is common to experience some constipation if taking pain medication after surgery.  Increasing fluid intake and taking a stool softener will usually help or prevent this problem from occurring.  A mild laxative (Milk of Magnesia or Miralax) should be taken according to package directions if there are no bowel movements after 48 hours. 7.  ACTIVITIES:  You may resume regular (light) daily activities beginning the next day--such as daily self-care, walking, climbing stairs--gradually increasing activities as tolerated.  You may have sexual intercourse when it is comfortable.  Refrain from any heavy lifting or straining until approved by your doctor. a. You may drive when you no longer are taking prescription pain medication, you can comfortably wear a seatbelt, and you can safely maneuver your car and apply brakes 8. You should see your doctor in the office for a follow-up   follow-up appointment approximately two weeks after your surgery.  Make sure that you call for this appointment within a day or two after you arrive home to insure a convenient appointment time. OTHER INSTRUCTIONS:  _____________________________________________________________ _____________________________________________________________  WHEN TO  CALL YOUR DOCTOR: 1. Fever over 101.0 2. Inability to urinate 3. Nausea and/or vomiting 4. Extreme swelling or bruising 5. Continued bleeding from incision. 6. Increased pain, redness, or drainage from the incision. 7. Difficulty swallowing or breathing 8. Muscle cramping or spasms. 9. Numbness or tingling in hands or feet or around lips.  The clinic staff is available to answer your questions during regular business hours.  Please dont hesitate to call and ask to speak to one of the nurses if you have concerns.  For further questions, please visit [email or web address]     Percutaneous Abscess Drain, Care After This sheet gives you information about how to care for yourself after your procedure. Your health care provider may also give you more specific instructions. If you have problems or questions, contact your health care provider. What can I expect after the procedure? After your procedure, it is common to have:  A small amount of bruising and discomfort in the area where the drainage tube (catheter) was placed.  Sleepiness and fatigue. This should go away after the medicines you were given have worn off. Follow these instructions at home: Incision care  Follow instructions from your health care provider about how to take care of your incision. Make sure you: ? Wash your hands with soap and water before you change your bandage (dressing). If soap and water are not available, use hand sanitizer. ? Change your dressing as told by your health care provider. ? Leave stitches (sutures), skin glue, or adhesive strips in place. These skin closures may need to stay in place for 2 weeks or longer. If adhesive strip edges start to loosen and curl up, you may trim the loose edges. Do not remove adhesive strips completely unless your health care provider tells you to do that.  Check your incision area every day for signs of infection. Check for: ? More redness, swelling, or  pain. ? More fluid or blood. ? Warmth. ? Pus or a bad smell. ? Fluid leaking from around your catheter (instead of fluid draining through your catheter). Catheter care   Follow instructions from your health care provider about emptying and cleaning your catheter and collection bag. You may need to clean the catheter every day so it does not clog.  If directed, write down the following information every time you empty your bag: ? The date and time. ? The amount of drainage. General instructions  Rest at home for 1-2 days after your procedure. Return to your normal activities as told by your health care provider.  Do not take baths, swim, or use a hot tub for 24 hours after your procedure, or until your health care provider says that this is okay.  Take over-the-counter and prescription medicines only as told by your health care provider.  Keep all follow-up visits as told by your health care provider. This is important. Contact a health care provider if:  You have less than 10 mL of drainage a day for 2-3 days in a row, or as directed by your health care provider.  You have more redness, swelling, or pain around your incision area.  You have more fluid or blood coming from your incision area.  Your incision area feels warm to the touch.  You have pus or a bad smell coming from your incision area.  You have fluid leaking from around your catheter (instead of through your catheter).  You have a fever or chills.  You have pain that does not get better with medicine. Get help right away if:  Your catheter comes out.  You suddenly stop having drainage from your catheter.  You suddenly have blood in the fluid that is draining from your catheter.  You become dizzy or you faint.  You develop a rash.  You have nausea or vomiting.  You have difficulty breathing or you feel short of breath.  You develop chest pain.  You have problems with your speech or vision.  You  have trouble balancing or moving your arms or legs. Summary  It is common to have a small amount of bruising and discomfort in the area where the drainage tube (catheter) was placed.  You may be directed to record the amount of drainage from the bag every time you empty it.  Follow instructions from your health care provider about emptying and cleaning your catheter and collection bag. This information is not intended to replace advice given to you by your health care provider. Make sure you discuss any questions you have with your health care provider. Document Released: 10/25/2013 Document Revised: 05/23/2017 Document Reviewed: 05/02/2016 Elsevier Patient Education  2020 [employer].

## 2019-04-29 NOTE — Progress Notes (Signed)
Physical Therapy Treatment Patient Details Name: Kim Myers MRN: [id number] DOB: [birth year] Today's Date: 04/29/2019    History of Present Illness 28 yo female admitted to ED on 10/21 with abdominal pain and emesis due to SBO. S/p SBR, drainage of intra-abdominal abscesses (with placement of JP drains 11/3), partial omentectomy, ileocecectomy on 10/27, with intubation 10/27-10/28. Pt with history anorexia and amenorrhea x1 year; ex-laparotomy with Meckel's diverticulectomy, LOA and appedectomy Dr. Gerrit Friends in 2011 for a small bowel volvulus, Meckel's diverticulum and incomplete bowel rotration.  She did suffer a liver laceration that was treated nonoperatively in 2018.    PT Comments    Pt is progressing well with mobility, she tolerated increased ambulation distance of 450' with IV pole, no loss of balance. Pt would benefit from RW for home use.   Follow Up Recommendations  Home health PT     Equipment Recommendations  Rolling walker with 5" wheels(to be determined)    Recommendations for Other Services       Precautions / Restrictions Precautions Precautions: Fall;Other (comment)(moderate fall risk) Precaution Comments: JP drains, abdominal Restrictions Weight Bearing Restrictions: No    Mobility  Bed Mobility Overal bed mobility: Needs Assistance Bed Mobility: Sit to Supine       Sit to supine: HOB elevated;Min assist   General bed mobility comments: min A for LEs into bed  Transfers Overall transfer level: Needs assistance Equipment used: None Transfers: Sit to/from Stand Sit to Stand: Supervision         General transfer comment: no assist needed  Ambulation/Gait Ambulation/Gait assistance: Supervision Gait Distance (Feet): 450 Feet Assistive device: IV Pole Gait Pattern/deviations: Step-through pattern;Decreased stride length Gait velocity: decr   General Gait Details: steady, no loss of balance, HR 110s   Stairs             Wheelchair  Mobility    Modified Rankin (Stroke Patients Only)       Balance Overall balance assessment: Needs assistance Sitting-balance support: No upper extremity supported;Feet supported Sitting balance-Leahy Scale: Good     Standing balance support: Bilateral upper extremity supported Standing balance-Leahy Scale: Fair Standing balance comment: able to stand without UE support, reliant on UE support during dynamic standing                            Cognition Arousal/Alertness: Awake/alert Behavior During Therapy: WFL for tasks assessed/performed Overall Cognitive Status: Within Functional Limits for tasks assessed                                        Exercises      General Comments        Pertinent Vitals/Pain Pain Score: 5  Pain Location: abdomen Pain Descriptors / Indicators: Sore Pain Intervention(s): Limited activity within patient's tolerance;Monitored during session;Premedicated before session;Patient requesting pain meds-RN notified    Home Living                      Prior Function            PT Goals (current goals can now be found in the care plan section) Acute Rehab PT Goals Patient Stated Goal: to be able to work out PT Goal Formulation: With patient Time For Goal Achievement: 05/12/19 Potential to Achieve Goals: Good Progress towards PT goals: Progressing toward goals    Frequency  Min 3X/week      PT Plan Current plan remains appropriate    Co-evaluation              AM-PAC PT "6 Clicks" Mobility   Outcome Measure  Help needed turning from your back to your side while in a flat bed without using bedrails?: A Little Help needed moving from lying on your back to sitting on the side of a flat bed without using bedrails?: A Little Help needed moving to and from a bed to a chair (including a wheelchair)?: A Little Help needed standing up from a chair using your arms (e.g., wheelchair or bedside  chair)?: A Little Help needed to walk in hospital room?: None Help needed climbing 3-5 steps with a railing? : A Little 6 Click Score: 19    End of Session   Activity Tolerance: Patient limited by pain;Patient tolerated treatment well Patient left: in bed;with call bell/phone within reach Nurse Communication: Mobility status;Patient requests pain meds(pt needs new SCDs) PT Visit Diagnosis: Difficulty in walking, not elsewhere classified (R26.2);Unsteadiness on feet (R26.81)     Time: 2130-8657 PT Time Calculation (min) (ACUTE ONLY): 24 min  Charges:  $Gait Training: 8-22 mins $Therapeutic Activity: 8-22 mins                     Blondell Reveal Kistler PT 04/29/2019  Acute Rehabilitation Services Pager [phone] Office [phone]

## 2019-04-29 NOTE — Progress Notes (Signed)
PT Cancellation Note  Patient Details Name: Kim Myers MRN: [id number] DOB: 04-26-1991   Cancelled Treatment:    Reason Eval/Treat Not Completed: Pain limiting ability to participate(pt stated she wants to walk but is waiting on pain medication. Will check back after she receives pain medication, spoke to RN and requested meds.)   [name] PT 04/29/2019  Acute Rehabilitation Services Pager [phone] Office [phone]

## 2019-04-29 NOTE — Plan of Care (Signed)
  Problem: Health Behavior/Discharge Planning: Goal: Ability to manage health-related needs will improve Outcome: Progressing   Problem: Clinical Measurements: Goal: Ability to maintain clinical measurements within normal limits will improve Outcome: Progressing Goal: Will remain free from infection Outcome: Progressing Goal: Diagnostic test results will improve Outcome: Progressing Goal: Respiratory complications will improve Outcome: Progressing Goal: Cardiovascular complication will be avoided Outcome: Progressing   Problem: Activity: Goal: Risk for activity intolerance will decrease Outcome: Progressing   Problem: Nutrition: Goal: Adequate nutrition will be maintained Outcome: Progressing   Problem: Elimination: Goal: Will not experience complications related to bowel motility Outcome: Progressing   Problem: Pain Managment: Goal: General experience of comfort will improve Outcome: Progressing   

## 2019-04-29 NOTE — Progress Notes (Signed)
PHARMACY - ADULT TOTAL PARENTERAL NUTRITION CONSULT NOTE  Pharmacy Consult: TPN Indication: SBO  Patient Measurements: Height: 5\' 6"  (167.6 cm) Weight: 148 lb 1.6 oz (67.2 kg) IBW/kg (Calculated) : 59.3 TPN AdjBW (KG): 60.4 Body mass index is 23.9 kg/m.   Assessment:  28 YOF presenting epigastric abd pain, nausea and vomiting.  Distention despite having semi-solid stool and tolerated clear liquid diet on 10/26, however CT shows SBO and for exlap 10/27> found sbo (intraabdominal abscesses), had small bowel recestion + drainage of abscesses and partial omentectomy. 11/3 drain placed for fluid collections. Resolving hx of anorexia, has gained weight since this summer. Advancing diet   GI: prealbumin low 5.9, NG removed 10/30, drain O/P 50mL. LBM 11/4  PPI IV BID, PRN Zofran Endo: no hx DM - CBGs controlled on TPN; SSI D/C'ed 04/25/19. Lytes: phos 5.0 Renal: SCr up to ~1.45, BUN WNL - UOP 1.4 ml/kg/hr, LR at 50 ml/hr Pulm: RA Cards: BP slight high, tachy improving ~100 Hepatobil: LFTs / tbili WNL, TG 167 (TG 730 on 11/2 PM likely an error) Neuro: PRN Dilaudid, methocarbamol, oxycodone  ID: [email or web address] for IAI and abscess, drain placed 11/3, afebrile, WBC 11.9 TPN Access: 04/20/19 TPN start date: 04/21/19  Nutritional Goals (per RD recs 10/28): 1900-2100 kCal, 90-110gm protein, >1.8L fluid per day  Current Nutrition:  TPN, reg diet, Boost supplement  Plan:  Halve TPN to 35 ml/hr per MD  TPN providing 49g AA, 126g CHO and 25g ILE for a total of 875 kCal, meeting 46% of patient needs Electrolytes in TPN: reduced with rate reduction Daily multivitamin and trace elements in TPN LR at 50 ml/hr per MD Pantoprazole to [place], PharmD, BCPS, [facility] Clinical Pharmacist  Please check AMION for all [place] phone numbers After 10:00 PM, call [place]

## 2019-04-29 NOTE — Progress Notes (Signed)
   04/28/19 2344  MEWS Score  Resp 17  ECG Heart Rate (!) 117  Pulse Rate (!) 113  BP (!) 134/92  Temp 98.7 F (37.1 C)  Level of Consciousness Alert  SpO2 94 %  O2 Device Room Air  MEWS Score  MEWS RR 0  MEWS Pulse 2  MEWS Systolic 0  MEWS LOC 0  MEWS Temp 0  MEWS Score 2  MEWS Score Color Yellow  MEWS Assessment  Is this an acute change? No  MEWS Guidelines - (patients age 28 and over)  Red - At High Risk for Deterioration Yellow - At risk for Deterioration  1. Go to room and assess patient 2. Validate data. Is this patient's baseline? If data confirmed: 3. Is this an acute change? 4. Administer prn meds/treatments as ordered. 5. Note Sepsis score 6. Review goals of care 7. Sports coach, RRT nurse and Provider. 8. Ask Provider to come to bedside.  9. Document patient condition/interventions/response. 10. Increase frequency of vital signs and focused assessments to at least q15 minutes x 4, then q30 minutes x2. - If stable, then q1h x3, then q4h x3 and then q8h or dept. routine. - If unstable, contact Provider & RRT nurse. Prepare for possible transfer. 11. Add entry in progress notes using the smart phrase ".MEWS". 1. Go to room and assess patient 2. Validate data. Is this patient's baseline? If data confirmed: 3. Is this an acute change? 4. Administer prn meds/treatments as ordered? 5. Note Sepsis score 6. Review goals of care 7. Sports coach and Provider 8. Call RRT nurse as needed. 9. Document patient condition/interventions/response. 10. Increase frequency of vital signs and focused assessments to at least q2h x2. - If stable, then q4h x2 and then q8h or dept. routine. - If unstable, contact Provider & RRT nurse. Prepare for possible transfer. 11. Add entry in progress notes using the smart phrase ".MEWS".  Green - Likely stable Lavender - Comfort Care Only  1. Continue routine/ordered monitoring.  2. Review goals of care. 1. Continue  routine/ordered monitoring. 2. Review goals of care.

## 2019-04-29 NOTE — Progress Notes (Signed)
   Subjective:  Patient reports she ambulated yesterday. Watery bowel movements, but has noticed no blood. Tolerating spacing her pain regimen out.    Objective:  Vital signs in last 24 hours: Vitals:   04/28/19 2018 04/28/19 2019 04/28/19 2344 04/29/19 0408  BP: (!) 133/100  (!) 134/92 (!) 134/91  Pulse: 100  (!) 113 100  Resp: 17  17 18   Temp:  98.3 F (36.8 C) 98.7 F (37.1 C) 98.7 F (37.1 C)  TempSrc:   Oral Oral  SpO2: 100%  94% 98%  Weight:      Height:        Physical Exam Gen: NAD, well developed Cardiovascular: Tachycardic, regular rythm, no mrg Pulmonary: No w/r/r . CTAB.  Abdominal: Nl bowel sounds, mild abdominal tenderness. Clean bandages in place. 2 drains draining serosanguinous fluid ( drain 1 -15cc, drain 2 65 cc)    Assessment/Plan:  Principal Problem:   SBO (small bowel obstruction) (HCC) Active Problems:   Hypothyroidism (acquired)   Amenorrhea   Acute renal injury (HCC)   Elevated TSH   Elevated serum free T4 level   Hyponatremia   Leukocytosis   Anemia   Respiratory failure, acute ([place])  28 year old female with a pPMHx of anorexia,amenorrhea(hypothalamic hypogonadism),Hx ofMeckel'sdiverticulum (s/p small bowel resection)who presented with SBO underwent surgical intervention after failing conservative management. Found to have necrotic small bowel requiring resection, volvulus, and abscesses requiring drainage.Clinical course also complicated byAKI andupper GI bleed.On 11/1 patient found to have new abdominal abscesses and had a large blood bowel movement overnight.   #Acute blood loss anemia 2/2 GI bleed  - General surgery following, blood loss likely due to anastomotic bleed.  - Hgb 4.4 on 11/2, Hgb responded to 7.8 after transfused 4 units. Hgb 6.3 on 11/3 with one bowel movement overnight. Transfused 2 units RBC. Post transfusion Hgb 8.6 - No bloody bowel movements for 2 days. Patient asymtpomatic, Hgb 7.9, but no clinical signs  of blood loss.   #Intrabdominal Infection/Abscess S/p Ex Lap with ileocecectomy with drainage of intra-abdominal abscess and partial omentectomy - POD#9; CT abdomen pelvis show large abdominal abscesses. IR placed drain in RLQ abscess on 11/3, removed 10cc and sent for culture on , NGTD. -  Gen surgery and IR are following.  - PRN Oxycodone 5 mg q4hrs for moderate pain , Dilaudid  1mg  q4hrs for severe pain - continuing IV zosyn , on day 11 . Pending Cx of abscess - She is on TPN for nutrition ,plan to half tomorrow. On boost and regular diet  - Follow cultures of abscess  #AKI - Secondary to volume depletion with n/v and poor PO intake . Baseline Cr ~.7. Elevated to 3.83 on 10/21 Down trended, stable ~ 1.2 on 11/4. Slight increase in Cr to 1.47 today, continue to monitor.  - Strict I/O's - Maintenance fluids  - continue to trend BMP  Dispo: Anticipated discharge pending clinical improvement.   Tamsen Snider, MD PGY1  [phone]

## 2019-04-29 NOTE — Progress Notes (Signed)
9 Days Post-Op   Subjective/Chief Complaint: No more blood per rectum, tol fulls, up out of bed, taking po    Objective: Vital signs in last 24 hours: Temp:  [98.3 F (36.8 C)-98.7 F (37.1 C)] 98.5 F (36.9 C) (11/05 0759) Pulse Rate:  [94-113] 94 (11/05 0759) Resp:  [16-25] 17 (11/05 0759) BP: (133-134)/(91-100) 133/94 (11/05 0759) SpO2:  [94 %-100 %] 98 % (11/05 0759) Weight:  [67.2 kg] 67.2 kg (11/04 1632) Last BM Date: 04/28/19  Intake/Output from previous day: 11/04 0701 - 11/05 0700 In: 2983.2 [P.O.:580; I.V.:2280.3; IV Piggyback:107.9] Out: 2280 [Urine:2200; Drains:80] Intake/Output this shift: No intake/output data recorded.  GI: approp tender, two drains with serous output, bs present wound clean with some exudate, nondistended  Lab Results:  Recent Labs    04/28/19 0952 04/29/19 0353  WBC 13.5* 11.9*  HGB 8.5* 7.9*  HCT 25.6* 24.1*  PLT 531* 540*   BMET Recent Labs    04/28/19 0301 04/29/19 0353  NA 136 136  K 4.2 4.0  CL 101 101  CO2 27 26  GLUCOSE 95 131*  BUN 17 15  CREATININE 1.23* 1.45*  CALCIUM 8.0* 8.0*   PT/INR Recent Labs    04/27/19 0541  LABPROT 15.5*  INR 1.3*   ABG No results for input(s): PHART, HCO3 in the last 72 hours.  Invalid input(s): PCO2, PO2  Studies/Results: Ct Image Guided Drainage By Percutaneous Catheter  Result Date: 04/27/2019 INDICATION: 28 year old female with a history abscess EXAM: CT GUIDED DRAINAGE OF  ABSCESS MEDICATIONS: The patient is currently admitted to the hospital and receiving intravenous antibiotics. The antibiotics were administered within an appropriate time frame prior to the initiation of the procedure. ANESTHESIA/SEDATION: 2.0 mg IV Versed 100 mcg IV Fentanyl Moderate Sedation Time:  21 minutes The patient was continuously monitored during the procedure by the interventional radiology nurse under my direct supervision. COMPLICATIONS: None TECHNIQUE: Informed written consent was obtained  from the patient after a thorough discussion of the procedural risks, benefits and alternatives. All questions were addressed. Maximal Sterile Barrier Technique was utilized including caps, mask, sterile gowns, sterile gloves, sterile drape, hand hygiene and skin antiseptic. A timeout was performed prior to the initiation of the procedure. PROCEDURE: The operative field was prepped with Chlorhexidine in a sterile fashion, and a sterile drape was applied covering the operative field. A sterile gown and sterile gloves were used for the procedure. Local anesthesia was provided with 1% Lidocaine. Patient positioned supine position on CT gantry table. Scout CT of the abdomen performed for planning purposes. The patient is prepped and draped in the usual sterile fashion. 1% lidocaine was used for local anesthesia. Using modified Seldinger technique, 10 French drain was placed and abscess collection of the right lower quadrant. Sample was aspirated first culture. Catheter was attached to bulb drainage and sutured in position. Patient tolerated the procedure well and remained hemodynamically stable throughout. No complications were encountered and no significant blood loss. FINDINGS: CT demonstrates 61 French drain into right lower quadrant abscess. Approximately 10 cc of fluid aspirated. IMPRESSION: Status post CT-guided drainage of right lower quadrant collection. Signed, Yvone Neu. Reyne Dumas, RPVI [facility] Rad[facility] Electronically Signed   By: Gilmer Mor D.O.   On: 04/27/2019 15:44    Anti-infectives: Anti-infectives (From admission, onward)   Start     Dose/Rate Route Frequency Ordered Stop   04/20/19 1430  fluconazole (DIFLUCAN) IVPB 200 mg     200 mg 100 mL/hr  over 60 Minutes Intravenous Every 24 hours 04/20/19 1418 04/24/19 1454   04/19/19 1130  piperacillin-tazobactam (ZOSYN) IVPB 3.375 g     3.375 g 12.5 mL/hr over 240 Minutes Intravenous Every 8  hours 04/19/19 1122     04/15/19 1200  levofloxacin (LEVAQUIN) IVPB 750 mg  Status:  Discontinued     750 mg 100 mL/hr over 90 Minutes Intravenous Every 24 hours 04/15/19 1127 04/15/19 1149   04/15/19 1200  levofloxacin (LEVAQUIN) IVPB 750 mg  Status:  Discontinued     750 mg 100 mL/hr over 90 Minutes Intravenous Every 48 hours 04/15/19 1149 04/19/19 1110   04/14/19 1100  cefTRIAXone (ROCEPHIN) 1 g in sodium chloride 0.9 % 100 mL IVPB  Status:  Discontinued     1 g 200 mL/hr over 30 Minutes Intravenous Every 24 hours 04/14/19 1055 04/15/19 1127      Assessment/Plan: POD 9 S/p Ex-Lap with ileocecectomy, SBR, drainage of intra-abdominal abscesses, partial omentectomy, adhesiolysis - Dr. Annye English - 04/20/2019  - CT 11/1 w/ rim enhancing fluid collections bilateral lower quadrants.drain placed with serous output, surgical drain likely can be removed prior to dc - Continue IV abx as below -half tpn, regular diet, can stop tpn tomorrow if tolerates diet, boost in addition -pulm toilet, oob -bid wet to dry dressings -PT recs for hhpt, will write for case mgt to see ABL Anemia - cbc overall stable, will recheck again in am, no clinical evidence of more bleeding FEN -soft diet VTE -SCDs, hold chemical prophylaxis due to bleed ID -Zosyn 10/26 >> until abdominal cx is back then can stop if negative dispo- possible dc tomorrow from surgical standpoint  Rolm Bookbinder 04/29/2019

## 2019-04-29 NOTE — TOC Progression Note (Addendum)
Transition of Care [facility]) - Progression Note    Patient Details  Name: Kim Myers MRN: [id number] Date of Birth: [birth year]  Transition [facility] Care [facility]) CM/SW Contact  Sharin Mons, RN Phone Number: 04/29/2019, 2:31 PM  Clinical Narrative:    Admitted with abdominal pain /SBO. Hx of anorexia and amenorrhea x1 year. Hospital course: s/p SBR, partial omentectomy, ileocecectomy on 10/27, JP drains x 2.  NCM spoke with pt regarding TOC needs. NCM shared PT's recommendation: home health PT. Pt declined home health services. States when medically ready for d/c she will d/c to mom's place of residence. Pt states mom will assist and for her.  Pt without medication and transportation concerns.  Munira Polson (Mother)     [phone]         [facility] team will continue to monitor ......   PCP: Harvie Bridge  Expected Discharge Plan: Home/Self Care Barriers to Discharge: Continued Medical Work up  Expected Discharge Plan and Services Expected Discharge Plan: Home/Self Care In-house Referral: NA Discharge Planning Services: CM Consult Post Acute Care Choice: NA Living arrangements for the past 2 months: Single Family Home                 DME Arranged: N/A DME Agency: NA       HH Arranged: NA HH Agency: NA         Social Determinants of Health (SDOH) Interventions    Readmission Risk Interventions No flowsheet data found.

## 2019-04-29 NOTE — Progress Notes (Signed)
Referring Physician(s): Dr Jeanmarie Hubert  Supervising Physician: Markus Daft  Patient Status:  [facility] - In-pt  Chief Complaint:   Ex-Lap with ileocecectomy, SBR, drainage of intra-abdominal abscesses, partial omentectomy, adhesiolysis - Dr. 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  Skin:    General: Skin is warm and dry.     Comments: Tender to touch OP pale yellow 80 cc yesterday Flushes easily NGTD  Neurological:     Mental Status: She is alert.     Imaging: Ct Abdomen Pelvis W Contrast  Result Date: 04/25/2019 CLINICAL DATA:  Postop from small bowel resection for necrotic bowel. Worsening leukocytosis and tachycardia. EXAM: CT ABDOMEN AND PELVIS WITH CONTRAST TECHNIQUE: Multidetector CT imaging of the abdomen and pelvis was performed using the standard protocol following bolus administration of intravenous contrast. CONTRAST:  140mL OMNIPAQUE IOHEXOL 300 MG/ML  SOLN COMPARISON:  Noncontrast CT on 04/19/2019 FINDINGS: Lower Chest: Increased size of moderate left and small right pleural effusions. Increased bilateral lower lobe atelectasis. Hepatobiliary: No hepatic masses identified. Gallbladder is unremarkable. No evidence of biliary ductal dilatation. Pancreas:  No mass or inflammatory changes. Spleen: Within normal limits in size and appearance. Adrenals/Urinary Tract: No masses identified. No evidence of hydronephrosis. Stomach/Bowel: Surgical drain is seen in place. Small amount of free intraperitoneal air is attributable to recent surgery. No  evidence of bowel obstruction. Mild diffuse small bowel wall thickening, mesenteric inflammatory changes, and diffuse body wall edema. Mild ascites is seen as well as diffuse peritoneal enhancement. Several rim enhancing fluid collections are seen which are located in the left lower quadrant measuring 5.1 cm, right lower quadrant measuring 5.1 cm, and pelvic cul-de-sac measuring 6.4 cm, which are consistent with abscesses. Vascular/Lymphatic: No pathologically enlarged lymph nodes. No abdominal aortic aneurysm. Mesenteric vessels are patent. Reproductive:  No mass or other significant abnormality. Other:  None. Musculoskeletal:  No suspicious bone lesions identified. IMPRESSION: Mild ascites and diffuse peritoneal enhancement, consistent with peritonitis. Small amount of postop free air. Several rim enhancing fluid collections in bilateral lower quadrants and pelvic cul-de-sac, largest measuring 6.4 cm, consistent with abscesses. Diffuse bowel wall thickening, mesenteric inflammatory changes, and diffuse body wall edema. No evidence of bowel obstruction. Increased size of moderate left and small right pleural effusions, and bilateral lower lobe atelectasis. Electronically Signed   By: Marlaine Hind M.D.   On: 04/25/2019 13:16   Ct Image Guided Drainage By Percutaneous Catheter  Result Date: 04/27/2019 INDICATION: 28 year old female with a history abscess EXAM: CT GUIDED DRAINAGE OF  ABSCESS MEDICATIONS: The patient is currently admitted to the hospital and receiving intravenous antibiotics. The antibiotics were administered within an appropriate time frame prior to the initiation of the procedure. ANESTHESIA/SEDATION: 2.0 mg IV Versed 100 mcg IV Fentanyl Moderate Sedation Time:  21 minutes The patient was continuously monitored during the procedure by the interventional radiology nurse under my direct supervision. COMPLICATIONS: None TECHNIQUE: Informed written consent  was obtained from the patient after a  thorough discussion of the procedural risks, benefits and alternatives. All questions were addressed. Maximal Sterile Barrier Technique was utilized including caps, mask, sterile gowns, sterile gloves, sterile drape, hand hygiene and skin antiseptic. A timeout was performed prior to the initiation of the procedure. PROCEDURE: The operative field was prepped with Chlorhexidine in a sterile fashion, and a sterile drape was applied covering the operative field. A sterile gown and sterile gloves were used for the procedure. Local anesthesia was provided with 1% Lidocaine. Patient positioned supine position on CT gantry table. Scout CT of the abdomen performed for planning purposes. The patient is prepped and draped in the usual sterile fashion. 1% lidocaine was used for local anesthesia. Using modified Seldinger technique, 10 French drain was placed and abscess collection of the right lower quadrant. Sample was aspirated first culture. Catheter was attached to bulb drainage and sutured in position. Patient tolerated the procedure well and remained hemodynamically stable throughout. No complications were encountered and no significant blood loss. FINDINGS: CT demonstrates 32 French drain into right lower quadrant abscess. Approximately 10 cc of fluid aspirated. IMPRESSION: Status post CT-guided drainage of right lower quadrant collection. Signed, Yvone Neu. Reyne Dumas, RPVI [facility] Radiology Electronically Signed   By: Gilmer Mor D.O.   On: 04/27/2019 15:44    Labs:  CBC: Recent Labs    04/26/19 0310  04/27/19 0541 04/27/19 1539 04/28/19 0952 04/29/19 0353  WBC 13.7*  --  11.7*  --  13.5* 11.9*  HGB 4.8*   < > 6.3* 8.6* 8.5* 7.9*  HCT 15.0*   < > 18.8* 25.4* 25.6* 24.1*  PLT 639*  --  467*  --  531* 540*   < > = values in this interval not displayed.    COAGS: Recent Labs    04/14/19 1055 04/20/19 1731 04/26/19 0310 04/27/19 0541  INR 1.3*  1.6* 1.4* 1.3*  APTT 34 29 34  --     BMP: Recent Labs    04/26/19 0310 04/27/19 0646 04/28/19 0301 04/29/19 0353  NA 135   135 137 136 136  K 3.7   3.7 4.2 4.2 4.0  CL 100   100 105 101 101  CO2 27   28 27 27 26   GLUCOSE 153*   155* 120* 95 131*  BUN 21*   21* 19 17 15   CALCIUM 7.4*   7.5* 7.8* 8.0* 8.0*  CREATININE 1.23*   1.25* 1.17* 1.23* 1.45*  GFRNONAA 60*   58* >60 60* 49*  GFRAA >60   >60 >60 >60 57*    LIVER FUNCTION TESTS: Recent Labs    04/21/19 0500 04/22/19 0409 04/26/19 0310 04/29/19 0353  BILITOT 0.9 0.4 0.1*   0.3 0.4  AST 59* 79* 37   36 34  ALT 42 46* 31   30 44  ALKPHOS 123 58 65   65 112  PROT 4.0* 3.9* 3.8*   3.8* 4.9*  ALBUMIN 1.4* 1.4* 1.2*   1.2* 1.5*    Assessment and Plan:  RLQ abscess drain form IR- placed 11/3 Intact Will follow Will need to follow as OP with IR Clinic if drain remains as DC Plan per [facility]  Electronically Signed: 13/05/20, PA-C 04/29/2019, 10:11 AM   I spent a total of 15 Minutes at the the patient's bedside AND on the patient's hospital floor or unit, greater than 50% of which was counseling/coordinating care for RLQ abscess  drain

## 2019-04-30 ENCOUNTER — Other Ambulatory Visit: Payer: Self-pay | Admitting: Physician Assistant

## 2019-04-30 DIAGNOSIS — Z91010 Allergy to peanuts: Secondary | ICD-10-CM

## 2019-04-30 DIAGNOSIS — T8143XA Infection following a procedure, organ and space surgical site, initial encounter: Secondary | ICD-10-CM

## 2019-04-30 LAB — CBC
HCT: 24.9 % — ABNORMAL LOW (ref 36.0–46.0)
Hemoglobin: 7.9 g/dL — ABNORMAL LOW (ref 12.0–15.0)
MCH: 28.9 pg (ref 26.0–34.0)
MCHC: 31.7 g/dL (ref 30.0–36.0)
MCV: 91.2 fL (ref 80.0–100.0)
Platelets: 592 10*3/uL — ABNORMAL HIGH (ref 150–400)
RBC: 2.73 MIL/uL — ABNORMAL LOW (ref 3.87–5.11)
RDW: 13.5 % (ref 11.5–15.5)
WBC: 12.4 10*3/uL — ABNORMAL HIGH (ref 4.0–10.5)
nRBC: 0 % (ref 0.0–0.2)

## 2019-04-30 LAB — BASIC METABOLIC PANEL
Anion gap: 9 (ref 5–15)
BUN: 11 mg/dL (ref 6–20)
CO2: 27 mmol/L (ref 22–32)
Calcium: 8.2 mg/dL — ABNORMAL LOW (ref 8.9–10.3)
Chloride: 100 mmol/L (ref 98–111)
Creatinine, Ser: 1.42 mg/dL — ABNORMAL HIGH (ref 0.44–1.00)
GFR calc Af Amer: 58 mL/min — ABNORMAL LOW (ref >60)
GFR calc non Af Amer: 50 mL/min — ABNORMAL LOW (ref >60)
Glucose, Bld: 108 mg/dL — ABNORMAL HIGH (ref 70–99)
Potassium: 4 mmol/L (ref 3.5–5.1)
Sodium: 136 mmol/L (ref 135–145)

## 2019-04-30 LAB — GLUCOSE, CAPILLARY
Glucose-Capillary: 107 mg/dL — ABNORMAL HIGH (ref 70–99)
Glucose-Capillary: 108 mg/dL — ABNORMAL HIGH (ref 70–99)
Glucose-Capillary: 109 mg/dL — ABNORMAL HIGH (ref 70–99)

## 2019-04-30 MED ORDER — AMOXICILLIN-POT CLAVULANATE 875-125 MG PO TABS: 1.0000 | ORAL_TABLET | Freq: Two times a day (BID) | ORAL | 0 refills | Status: DC

## 2019-04-30 MED ORDER — OXYCODONE HCL 5 MG PO TABS
5.0000 mg | ORAL_TABLET | ORAL | Status: DC | PRN
  Administered 2019-04-30: 12:00:00 10 mg via ORAL
  Filled 2019-04-30: qty 2

## 2019-04-30 MED ORDER — AMOXICILLIN-POT CLAVULANATE 875-125 MG PO TABS: 1.0000 | ORAL_TABLET | Freq: Two times a day (BID) | ORAL | 0 refills | Status: AC

## 2019-04-30 MED ORDER — OXYCODONE HCL 5 MG PO TABS: 5.0000 mg | ORAL_TABLET | ORAL | 0 refills | Status: AC | PRN

## 2019-04-30 NOTE — TOC Transition Note (Signed)
Transition of Care [facility]) - CM/SW Discharge Note   Patient Details  Name: Kim Myers MRN: [id number] Date of Birth: [birth year]  Transition of Care [facility]) CM/SW Contact:  Sharin Mons, RN Phone Number: 04/30/2019, 3:26 PM   Clinical Narrative:    Pt transitioning today to home with mom. Continues to decline home health services. Changed mind regarding rolling walker. Now requesting  Rolling walker. Referral made with [employer]...walker will be delivered to bedside prior to discharge.   Final next level of care: Home/Self Care(Pt declined home health services) Barriers to Discharge: Continued Medical Work up   Patient Goals and CMS Choice     Choice offered to / list presented to : NA  Discharge Placement                       Discharge Plan and Services In-house Referral: NA Discharge Planning Services: CM Consult Post Acute Care Choice: NA          DME Arranged: Walker rolling DME Agency: [employer] Date DME Agency Contacted: 04/30/19 Time DME Agency Contacted: [id number] Representative spoke with at DME Agency: [place] HH Arranged: NA [place] Agency: NA        Social Determinants of Health ([place]) Interventions     Readmission Risk Interventions No flowsheet data found.

## 2019-04-30 NOTE — Progress Notes (Signed)
  [place] for discharge from IR standpoint.  Order placed for nurse to instruct patient on how to flush drain. 3-5 mL daily.   Will give her a few saline flushes until she can get some of her own.   Her OP follow up appointment has been ordered in [place] and our office will call with date and time.  She understands to maintain a record of output.   Quanah Majka S Makaylynn Bonillas PA-C 04/30/2019 1:29 PM

## 2019-04-30 NOTE — Discharge Summary (Signed)
Name: Kim Myers MRN: [id number] DOB: 29-Jun-1990 28 y.o. PCP: Romualdo Bolk, MD  Date of Admission: 04/14/2019  8:28 AM Date of Discharge: 04/30/2019 Attending Physician: Gust Rung, DO  Discharge Diagnosis: 1. Small bowel obstruction 2. Intraabdominal Infection/Abscess 3. Acute blood loss anemia  Discharge Medications: Allergies as of 04/30/2019      Reactions   Gluten Meal Diarrhea, Nausea And Vomiting, Other (See Comments)   Caused internal bleeding that resulted in hospitalization!!   Cauliflower [brassica Oleracea]    Food    Red Grape Apple   Peanut-containing Drug Products       Medication List    TAKE these medications   amoxicillin-clavulanate 875-125 MG tablet Commonly known as: AUGMENTIN Take 1 tablet by mouth every 12 (twelve) hours for 3 days.   oxyCODONE 5 MG immediate release tablet Commonly known as: Oxy IR/ROXICODONE Take 1 tablet (5 mg total) by mouth every 4 (four) hours as needed for up to 7 days for moderate pain or severe pain.       Disposition and follow-up:   Ms.Shalane Takeshita was discharged from [facility] in Stable condition.  At the hospital follow up visit please address:  1.  #Small bowel obstruction s/p ex lap with ileocecectomy with drainage of intraabdominal abscess and partial omentectomy  #Intraabdomial abscess/Infection - Surgery on 10/27 with Dr.Chris White. Removed JP drain before discharge - Post surgery CT found 3 abdominal abscess, Drain placed in one abscess 11/3 , drain to be removed in IR clinic 11/13.  11/23 - 11 days of Zosyn and home with 3 days of Augmentin - Check CBC , WBC down trending ~12 on discharge - Hx of anorexia, on TPN in hospital.  Will need to increase protein intake, recommend protein shakes x3 day.  - Follow up with Gen surge   #Acute blood loss anemia   Iron deficiency anemia - Transfused in hospital before surgery 1unit for coffee ground emesis in NG tube. Occult blood  positive at that time. Thought to be upper GI bleed, but resolved quickly. Patient went for surgery after failing conservative measures for SBO. Received 6 units post surgery after large bloody bowel movements. Resolved , 3 days of no blood in bowel movement. Thought to be from anastomosis site.  - Check CBC:  on discharge, Hgb 7.9.  - Consider repeat iron studies. Likely will need iron supplementation - Consider GI referral for EGD.    #AKI  Check BMP: Cr ~1.4 on discharge  2.  Labs / imaging needed at time of follow-up: CBC ,BMP  3.  Pending labs/ test needing follow-up: follow up cultures of abscess  Follow-up Appointments: Follow-up Information    Andria Meuse, MD. Go on 05/17/2019.   Specialty: General Surgery Why: Your appointment is 05/17/2019 @ 11:15am Please arrive 15 minutes prior to your appointment to check in and fill out paperwork. bring photo ID and insurance information. Contact information: [address] [place] [address] [phone]        Gilmer Mor, DO Follow up.   Specialties: Interventional Radiology, Radiology Contact information: [address] [place] [address] [phone]        [facility] Follow up on 05/07/2019.   Why: 9:45am Contact information: [address] [place] [phone]       Dois Davenport, MD Follow up in 2 week(s).   Specialty: Family Medicine Contact information: [address] [place] [address] [phone]           Hospital Course by problem list: 1.  #Small bowel obstruction s/p ex lap with ileocecectomy with drainage of intraabdominal abscess and partial omentectomy  #Intraabdomial abscess/Infection #Acute blood loss anemia Hx ofMeckel'sdiverticulum (s/p small bowel resection)who presented with SBO underwent surgical intervention after failing conservative management. Found to have necrotic small bowel  requiring resection, volvulus, and abscesses requiring drainage.On 11/1 patient found to have new abdominal abscesses and had a large blood bowel movement overnight. Thought to be bleeding from anastomosis site. Hgb 4.4 on 11/2, Hgb responded to 7.8 after transfused 4 units. Hgb 6.3 on 11/3 with one bowel movement overnight. Transfused 2 units RBC. Post transfusion Hgb 8.6. No bloody bowel movements x3 days and stable Hgb 7.9 on discharge.  Received 11 days of IV Zosyn and sent home with 3 days of Augmentin to finish 4 day course.  #AKI - Secondary to volume depletion with n/v and poor PO intake . Baseline Cr ~.7. Elevated to 3.83 on 10/21 Down trended, 1.42 on discharge.    Discharge Vitals:   BP (!) 136/95    Pulse 85    Temp 100 F (37.8 C) (Oral)    Resp 13    Ht 5\' 6"  (1.676 m)    Wt 67.2 kg    SpO2 96%    BMI 23.90 kg/m   Pertinent Labs, Studies, and Procedures:  CBC Latest Ref Rng & Units 04/30/2019 04/29/2019 04/28/2019  WBC 4.0 - 10.5 K/uL 12.4(H) 11.9(H) 13.5(H)  Hemoglobin 12.0 - 15.0 g/dL 7.9(L) 7.9(L) 8.5(L)  Hematocrit 36.0 - 46.0 % 24.9(L) 24.1(L) 25.6(L)  Platelets 150 - 400 K/uL 592(H) 540(H) 531(H)   BMP Latest Ref Rng & Units 04/30/2019 04/29/2019 04/28/2019  Glucose 70 - 99 mg/dL 13/09/2018) 638(L) 95  BUN 6 - 20 mg/dL 11 15 17   Creatinine 0.44 - 1.00 mg/dL 373(S) ) 2.87(G)  BUN/Creat Ratio 9 - 23 - - -  Sodium 135 - 145 mmol/L 136 136 136  Potassium 3.5 - 5.1 mmol/L 4.0 4.0 4.2  Chloride 98 - 111 mmol/L 100 101 101  CO2 22 - 32 mmol/L 27 26 27   Calcium 8.9 - 10.3 mg/dL 8.2(L) 8.0(L) 8.0(L)   Iron/TIBC/Ferritin/ %Sat    Component Value Date/Time   IRON 9 (L) 04/16/2019 0653   TIBC 232 (L) 04/16/2019 0653   FERRITIN 300 04/16/2019 0653   FERRITIN 21 12/22/2017 1449   IRONPCTSAT 4 (L) 04/16/2019 0653   10/21 02/22/2018 RENAL  FINDINGS: Right Kidney:  Renal measurements: 11.8 x 4.6 x 6.7 cm = volume: 191 mL. Mildly increased renal cortical echogenicity. No mass or  hydronephrosis visualized.  Left Kidney:  Renal measurements: 14.4 x 5.9 x 5.0 cm = volume: 223 mL. Echogenicity within normal limits. No mass or hydronephrosis visualized.  Bladder:  Appears normal for degree of bladder distention.  Other:  Small to moderate volume ascites throughout the abdomen.  IMPRESSION: 1. Negative for obstructive uropathy. 2. Slightly increased renal cortical echogenicity of the right kidney, which could reflect medical renal disease. 3. Left kidney is asymmetrically large relative to the right suggesting compensatory hypertrophy. 4. Small to moderate volume ascites.  EXAM: 04/19/2019 CT ABDOMEN AND PELVIS WITHOUT CONTRAST  TECHNIQUE: Multidetector CT imaging of the abdomen and pelvis was performed following the standard protocol without IV contrast.  COMPARISON:  April 14, 2019.  FINDINGS: Lower chest: Small left pleural effusion is noted with adjacent subsegmental atelectasis.  Hepatobiliary: No  focal liver abnormality is seen. No gallstones, gallbladder wall thickening, or biliary dilatation.  Pancreas: Unremarkable. No pancreatic ductal dilatation or surrounding inflammatory changes.  Spleen: Normal in size without focal abnormality.  Adrenals/Urinary Tract: Adrenal glands are unremarkable. Kidneys are normal, without renal calculi, focal lesion, or hydronephrosis. Bladder is unremarkable.  Stomach/Bowel: Status post appendectomy. Stomach is unremarkable. Severely dilated small bowel loops are noted which are more prominent compared to prior exam. There is seen some contrast within the dilated small bowel loops, but no colonic contrast is noted. These findings are highly concerning for distal small bowel obstruction. Distal small bowel loops are not well visualized due to lack of intravenous contrast as well as no oral contrast in the distal small bowel.  Vascular/Lymphatic: No significant vascular findings are  present. No enlarged abdominal or pelvic lymph nodes.  Reproductive: Uterus and bilateral adnexa are unremarkable.  Other: Mild anasarca is noted. Minimal ascites is noted. Small fat containing periumbilical hernia is noted.  Musculoskeletal: No acute or significant osseous findings.  IMPRESSION: Significantly worsening small bowel dilatation is noted most consistent with distal small bowel obstruction.  Mild anasarca is noted.  Minimal ascites is noted.  Small left pleural effusion is noted with adjacent subsegmental atelectasis.   EXAM: 04/25/2019 CT ABDOMEN AND PELVIS WITH CONTRAST  TECHNIQUE: Multidetector CT imaging of the abdomen and pelvis was performed using the standard protocol following bolus administration of intravenous contrast.  CONTRAST:  100mL OMNIPAQUE IOHEXOL 300 MG/ML  SOLN  COMPARISON:  Noncontrast CT on 04/19/2019  FINDINGS: Lower Chest: Increased size of moderate left and small right pleural effusions. Increased bilateral lower lobe atelectasis.  Hepatobiliary: No hepatic masses identified. Gallbladder is unremarkable. No evidence of biliary ductal dilatation.  Pancreas:  No mass or inflammatory changes.  Spleen: Within normal limits in size and appearance.  Adrenals/Urinary Tract: No masses identified. No evidence of hydronephrosis.  Stomach/Bowel: Surgical drain is seen in place. Small amount of free intraperitoneal air is attributable to recent surgery. No evidence of bowel obstruction. Mild diffuse small bowel wall thickening, mesenteric inflammatory changes, and diffuse body wall edema. Mild ascites is seen as well as diffuse peritoneal enhancement. Several rim enhancing fluid collections are seen which are located in the left lower quadrant measuring 5.1 cm, right lower quadrant measuring 5.1 cm, and pelvic cul-de-sac measuring 6.4 cm, which are consistent with abscesses.  Vascular/Lymphatic: No pathologically enlarged  lymph nodes. No abdominal aortic aneurysm. Mesenteric vessels are patent.  Reproductive:  No mass or other significant abnormality.  Other:  None.  Musculoskeletal:  No suspicious bone lesions identified.  IMPRESSION: Mild ascites and diffuse peritoneal enhancement, consistent with peritonitis. Small amount of postop free air.  Several rim enhancing fluid collections in bilateral lower quadrants and pelvic cul-de-sac, largest measuring 6.4 cm, consistent with abscesses.  Diffuse bowel wall thickening, mesenteric inflammatory changes, and diffuse body wall edema. No evidence of bowel obstruction.  Increased size of moderate left and small right pleural effusions, and bilateral lower lobe atelectasis.   Culture Results of Abscess Abdomen, from IR drain placement on 11/3    Component 5d ago  Specimen Description ABSCESS ABDOMEN   Special Requests NONE   Gram Stain ABUNDANT WBC PRESENT,BOTH PMN AND MONONUCLEAR  NO ORGANISMS SEEN  Performed at [facility], [address] [place], [place] [id number]   Culture RARE ENTEROCOCCUS GALLINARUM   Report Status PENDING   Organism ID, Bacteria ENTEROCOCCUS GALLINARUM   Resulting Agency [facility]  Susceptibility  Enterococcus gallinarum    MIC    AMPICILLIN <=2 SENSITIVE  Sensitive    GENTAMICIN SYNERGY SENSITIVE  Sensitive    LINEZOLID 2 SENSITIVE  Sensitive    VANCOMYCIN RESISTANT  Resistant         Susceptibility Comments  Enterococcus gallinarum  RARE ENTEROCOCCUS GALLINARUM            Discharge Instructions: Discharge Instructions    Diet - low sodium heart healthy   Complete by: As directed    Increase activity slowly   Complete by: As directed       Signed:  Tamsen Snider, MD PGY1  [phone]

## 2019-04-30 NOTE — Progress Notes (Signed)
PHARMACY - ADULT TOTAL PARENTERAL NUTRITION CONSULT NOTE  Pharmacy Consult: TPN Indication: SBO  Patient Measurements: Height: 5\' 6"  (167.6 cm) Weight: 148 lb 1.6 oz (67.2 kg) IBW/kg (Calculated) : 59.3 TPN AdjBW (KG): 60.4 Body mass index is 23.9 kg/m.   Assessment:  28 YOF presenting epigastric abd pain, nausea and vomiting.  Distention despite having semi-solid stool and tolerated clear liquid diet on 10/26, however CT shows SBO and for exlap 10/27> found sbo (intraabdominal abscesses), had small bowel recestion + drainage of abscesses and partial omentectomy. 11/3 drain placed for fluid collections. Resolving hx of anorexia, has gained weight since this summer. Advancing diet   GI: prealbumin low 5.9, NG removed 10/30, drain O/P 52mL. LBM 11/4  PPI IV BID, PRN Zofran; ate 75% reg diet last night Endo: no hx DM - CBGs controlled on TPN; SSI D/C'ed 04/25/19. Lytes: no phos/mg, others WNL Renal: SCr up to ~1.4, BUN WNL - UOP not charted, LR at 50 ml/hr Pulm: RA Cards: BP 130s, tachy improved  Hepatobil: LFTs / tbili WNL, TG 167 (TG 730 on 11/2 PM likely an error) Neuro: PRN Dilaudid, methocarbamol, oxycodone  ID: Zosyn for IAI and abscess, drain placed 11/3, afebrile, WBC 12.4 TPN Access: 04/20/19 TPN start date: 04/21/19  Nutritional Goals (per RD recs 10/28): 1900-2100 kCal, 90-110gm protein, >1.8L fluid per day  Current Nutrition:  TPN, reg diet, Boost supplement  Plan:  Stop TPN today  Pharmacy will sign off    Benetta Spar, PharmD, BCPS, BCCP Clinical Pharmacist  Please check AMION for all [facility] phone numbers After 10:00 PM, call [place] [phone]

## 2019-04-30 NOTE — Progress Notes (Addendum)
   Subjective:   Patient is agreeable to go home.  Understands plan and has spoke to the surgery teams. Patient is established care with a PCP , but will follow up with [facility] for one time hospital follow up.   Objective:  Vital signs in last 24 hours: Vitals:   04/29/19 2028 04/30/19 0027 04/30/19 0028 04/30/19 0427  BP: (!) 131/91  130/86   Pulse: 93 88 89   Resp: 17 16 16    Temp:  99.1 F (37.3 C)  100 F (37.8 C)  TempSrc:  Oral  Oral  SpO2: 100%  96%   Weight:      Height:        Physical Exam Gen: NAD, well developed Cardiovascular: Tachycardic, regular rythm, no mrg Pulmonary: No w/r/r . CTAB.  Abdominal: Nl bowel sounds, mild abdominal tenderness. Clean bandages in place. 2 drains draining serosanguinous fluid ( drain 1 -15cc, drain 2 65 cc)    Assessment/Plan:  Principal Problem:   SBO (small bowel obstruction) (HCC) Active Problems:   Hypothyroidism (acquired)   Amenorrhea   Acute renal injury (HCC)   Elevated TSH   Elevated serum free T4 level   Hyponatremia   Leukocytosis   Anemia   Respiratory failure, acute ([place])  28 year old female with a pPMHx of anorexia,amenorrhea(hypothalamic hypogonadism),Hx ofMeckel'sdiverticulum (s/p small bowel resection)who presented with SBO underwent surgical intervention after failing conservative management. Found to have necrotic small bowel requiring resection, volvulus, and abscesses requiring drainage.Clinical course also complicated byAKI andupper GI bleed.On 11/1 patient found to have new abdominal abscesses and had a large blood bowel movement overnight.   #Acute blood loss anemia 2/2 GI bleed  - General surgery following, blood loss likely due to anastomotic bleed.  - Hgb 4.4 on 11/2, Hgb responded to 7.8 after transfused 4 units. Hgb 6.3 on 11/3 with one bowel movement overnight. Transfused 2 units RBC. Post transfusion Hgb 8.6 - No bloody bowel movements for 3 days. Patient asymtpomatic, stable Hgb 7.9   #Intrabdominal Infection/Abscess S/p Ex Lap with ileocecectomy with drainage of intra-abdominal abscess and partial omentectomy - POD#10; CT abdomen pelvis show large abdominal abscesses. IR placed drain in RLQ abscess on 11/3, removed 10cc and sent for culture on , NGTD.  -  Gen surgery and IR are following. - continuing IV zosyn , on day 11 . Pending Cx of abscess Send home on 3 days of Augmentin - increase protein intake with supplemental protein shakes. Stop TPN - follow up with IR for drain removal  - Follow cultures of abscess  #AKI - Secondary to volume depletion with n/v and poor PO intake . Baseline Cr ~.7. Elevated to 3.83 on 10/21 Down trended, stable Cr.  - Strict I/O's - Maintenance fluids  - continue to trend BMP  Dispo: Anticipated discharge today.   Tamsen Snider, MD PGY1  [phone]

## 2019-04-30 NOTE — Progress Notes (Signed)
10 Days Post-Op  Subjective: CC: Doing well. Tolerating her diet without n/v. No further bloody bm's. Had a normal bm yesterday. Passing flatus.   Objective: Vital signs in last 24 hours: Temp:  [98.7 F (37.1 C)-100 F (37.8 C)] 100 F (37.8 C) (11/06 0427) Pulse Rate:  [85-93] 85 (11/06 0428) Resp:  [13-17] 13 (11/06 0428) BP: (130-136)/(86-99) 136/95 (11/06 0428) SpO2:  [96 %-100 %] 96 % (11/06 0428) Last BM Date: 04/28/19  Intake/Output from previous day: 11/05 0701 - 11/06 0700 In: 2795.9 [P.O.:1018; I.V.:1564.9; IV Piggyback:213] Out: 30 [Drains:30] Intake/Output this shift: No intake/output data recorded.  PE: Gen: Alert, NAD, pleasant Pulm: Normal rate and effort  UXL:KGMW, ND, NT, Midline wound with dressing in place, clean and dry with healthy granulation tissue and some fibrinous tissue at base. No evidence of drainage or active wound infection. JP surgical drain removed. IR drain with minimal output in bulb.  Psych: A&Ox3  Skin: no rashes noted, warm and dry  Lab Results:  Recent Labs    04/29/19 0353 04/30/19 0450  WBC 11.9* 12.4*  HGB 7.9* 7.9*  HCT 24.1* 24.9*  PLT 540* 592*   BMET Recent Labs    04/29/19 0353 04/30/19 0450  NA 136 136  K 4.0 4.0  CL 101 100  CO2 26 27  GLUCOSE 131* 108*  BUN 15 11  CREATININE 1.45* 1.42*  CALCIUM 8.0* 8.2*   PT/INR No results for input(s): LABPROT, INR in the last 72 hours. CMP     Component Value Date/Time   NA 136 04/30/2019 0450   NA 127 (L) 12/22/2017 1449   K 4.0 04/30/2019 0450   CL 100 04/30/2019 0450   CO2 27 04/30/2019 0450   GLUCOSE 108 (H) 04/30/2019 0450   BUN 11 04/30/2019 0450   BUN 9 12/22/2017 1449   CREATININE 1.42 (H) 04/30/2019 0450   CALCIUM 8.2 (L) 04/30/2019 0450   PROT 4.9 (L) 04/29/2019 0353   PROT 6.8 12/22/2017 1449   ALBUMIN 1.5 (L) 04/29/2019 0353   ALBUMIN 5.0 12/22/2017 1449   AST 34 04/29/2019 0353   ALT 44 04/29/2019 0353   ALKPHOS 112 04/29/2019 0353    BILITOT 0.4 04/29/2019 0353   BILITOT 0.2 12/22/2017 1449   GFRNONAA 50 (L) 04/30/2019 0450   GFRAA 58 (L) 04/30/2019 0450   Lipase     Component Value Date/Time   LIPASE 18 04/14/2019 0846       Studies/Results: No results found.  Anti-infectives: Anti-infectives (From admission, onward)   Start     Dose/Rate Route Frequency Ordered Stop   04/20/19 1430  fluconazole (DIFLUCAN) IVPB 200 mg     200 mg 100 mL/hr over 60 Minutes Intravenous Every 24 hours 04/20/19 1418 04/24/19 1454   04/19/19 1130  piperacillin-tazobactam (ZOSYN) IVPB 3.375 g     3.375 g 12.5 mL/hr over 240 Minutes Intravenous Every 8 hours 04/19/19 1122     04/15/19 1200  levofloxacin (LEVAQUIN) IVPB 750 mg  Status:  Discontinued     750 mg 100 mL/hr over 90 Minutes Intravenous Every 24 hours 04/15/19 1127 04/15/19 1149   04/15/19 1200  levofloxacin (LEVAQUIN) IVPB 750 mg  Status:  Discontinued     750 mg 100 mL/hr over 90 Minutes Intravenous Every 48 hours 04/15/19 1149 04/19/19 1110   04/14/19 1100  cefTRIAXone (ROCEPHIN) 1 g in sodium chloride 0.9 % 100 mL IVPB  Status:  Discontinued     1 g 200 mL/hr over 30  Minutes Intravenous Every 24 hours 04/14/19 1055 04/15/19 1127       Assessment/Plan SBO 2/2 volvulus of segment of ileum POD 10S/p Ex-Lap with ileocecectomy, SBR, drainage of intra-abdominal abscesses, partial omentectomy, adhesiolysis - Dr. Annye English - 04/20/2019  - CT 11/1 w/ rim enhancing fluid collections bilateral lower quadrants.IR placed drain placed with serous output. Drain per IR - JP drain removed  - Continue IV abx until culture returns. If no growth, abx can be d/c'd - D/c TPN -pulm toilet, oob -bid wet to dry dressings. Mother is going to help with dressing changes at home -PT recs for hhpt  ABLAnemia -Hgb stable at 7.9. No evidence of ongoing bleeding  FEN -soft diet. Stop TPN VTE -SCDs ID -Zosyn 10/26 >>until abdominal cx is back then can stop if negative  Dispo - Can be discharged from general surgery standpoint  Follow up - Dr. Dema Severin     LOS: 16 days    Jillyn Ledger , [facility] Surgery 04/30/2019, 9:29 AM Please see Amion for pager number during day hours 7:00am-4:30pm

## 2019-05-02 LAB — AEROBIC/ANAEROBIC CULTURE W GRAM STAIN (SURGICAL/DEEP WOUND)

## 2019-05-03 ENCOUNTER — Other Ambulatory Visit: Payer: Self-pay | Admitting: Surgery

## 2019-05-03 DIAGNOSIS — T8143XA Infection following a procedure, organ and space surgical site, initial encounter: Secondary | ICD-10-CM

## 2019-05-07 ENCOUNTER — Ambulatory Visit: Payer: BC Managed Care – PPO

## 2019-05-11 ENCOUNTER — Other Ambulatory Visit: Payer: BC Managed Care – PPO

## 2019-05-11 ENCOUNTER — Inpatient Hospital Stay: Admission: RE | Admit: 2019-05-11 | Payer: BC Managed Care – PPO | Source: Ambulatory Visit

## 2019-05-18 DIAGNOSIS — F419 Anxiety disorder, unspecified: Secondary | ICD-10-CM | POA: Diagnosis not present

## 2019-05-24 DIAGNOSIS — R197 Diarrhea, unspecified: Secondary | ICD-10-CM | POA: Diagnosis not present

## 2019-05-24 DIAGNOSIS — K639 Disease of intestine, unspecified: Secondary | ICD-10-CM | POA: Diagnosis not present

## 2019-05-25 ENCOUNTER — Other Ambulatory Visit: Payer: Self-pay

## 2019-05-25 NOTE — Progress Notes (Signed)
29 y.o. G0P0000 Single White or Caucasian Not Hispanic or Latino female here for annual exam.  Would like STD testing. H/O hypothalamic amenorrhea, followed by Endocrinology. She has an appointment with Endocrinology.  She had some spotting in 10/20, prior to that not having cycles. She has had some issues with disordered eating. Endocrinologist was hoping if she gained weight that her cycle would come it.  She is open to trying OCP's.   Not sexually active since her hospitalization, negative UPT in the hospital. Always uses condoms.     Patient was admitted to the hospital in 1020 with a SBO, needed a laparotomy and small bowel resection. She is continuing to recover. Having trouble gaining weight.   She has been a little bit down, in therapy.   [name] was 04/18/2019 (approximate).          Sexually active: Yes.    The current method of family planning is condoms most of the time.    Exercising: Yes, walking Smoker:  no  Health Maintenance: Pap:  12/22/2017 WNL History of abnormal Pap:  no TDaP: 04/2017 per patient Gardasil: x 3.    reports that she has never smoked. She has never used smokeless tobacco. She reports current alcohol use. She reports that she does not use drugs. Rare ETOH. 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  Past Medical History:  Diagnosis Date  . Amenorrhea   . Anemia   . Blood transfusion without reported diagnosis   . Gluten intolerance   . History of anemia    resolved with dietary changes  . Hypothyroidism (acquired) 03/25/2018    Past Surgical History:  Procedure Laterality Date  . ILEOCECETOMY  04/20/2019   Procedure: Ileocecetomy;  Surgeon: Ileana Roup, MD;  Location: [facility];  Service: General;;  . LAPAROTOMY N/A 04/20/2019   Procedure: EXPLORATORY LAPAROTOMY,  SMALL BOWEL RESECTION, partial omentectomy, drainage of abdominal abscess;  Surgeon: Ileana Roup, MD;  Location: [facility];  Service: General;  Laterality: N/A;  . LYSIS OF ADHESION  04/20/2019   Procedure: Lysis Of Adhesion;  Surgeon: Ileana Roup, MD;  Location: [facility];  Service: General;;  . MECKEL DIVERTICULUM EXCISION  at 72   started as laparoscopy, ended in laparotomy    No current outpatient medications on file.   No current facility-administered medications for this visit.     Family History  Problem Relation Age of Onset  . Breast cancer Paternal Grandmother   . Ovarian cancer Maternal Grandmother     Review of Systems  Constitutional: Negative.   HENT: Negative.   Eyes: Negative.   Respiratory: Negative.   Cardiovascular: Negative.   Gastrointestinal: Negative.   Endocrine: Negative.   Genitourinary: Positive for menstrual problem.  Musculoskeletal: Negative.   Skin: Negative.   Allergic/Immunologic: Negative.   Neurological: Negative.   Hematological: Negative.   Psychiatric/Behavioral: Negative.     Exam:   BP 110/76 (BP Location: Right Arm, Patient Position: Sitting, Cuff Size: Normal)   Pulse 80   Temp (!) 97.5 F (36.4 C) (Skin)   Wt 112 lb 12.8 oz (51.2 kg)   LMP 04/18/2019 (Approximate)   BMI 18.21 kg/m   Weight change: @WEIGHTCHANGE @ Height:      Ht Readings from Last 3 Encounters:  04/20/19 5\' 6"  (1.676 m)  08/25/18 5\' 5"  (1.651 m)  08/07/18 5\' 5"  (1.651 m)    General  appearance: alert, cooperative and appears stated age Head: Normocephalic, without obvious abnormality, atraumatic Neck: no adenopathy, supple, symmetrical, trachea midline and thyroid normal to inspection and palpation Lungs: clear to auscultation bilaterally Cardiovascular: regular rate and rhythm Breasts: normal appearance, no masses or tenderness Abdomen: soft, non-tender; non distended,  no masses,  no organomegaly Extremities: extremities normal, atraumatic, no cyanosis or edema Skin: Skin color, texture, turgor normal. No rashes or lesions Lymph  nodes: Cervical, supraclavicular, and axillary nodes normal. No abnormal inguinal nodes palpated Neurologic: Grossly normal   Pelvic: External genitalia:  no lesions              Urethra:  normal appearing urethra with no masses, tenderness or lesions              Bartholins and Skenes: normal                 Vagina: normal appearing vagina with normal color and discharge, no lesions              Cervix: no lesions               Bimanual Exam:  Uterus:  normal size, contour, position, consistency, mobility, non-tender              Adnexa: no mass, fullness, tenderness               Rectovaginal: Confirms               Anus:  normal sphincter tone, no lesions  Chaperone was present for exam.  A:  Well Woman with normal exam  Hypothalamic amenorrhea, followed by Endocrinologist  P:   No pap this year  STD screening  Discussed breast self exam  Discussed calcium and vit D intake  Start on low dose OCP's, no contraindications, risks reviewed  Pill check in 3 months

## 2019-05-26 ENCOUNTER — Encounter: Payer: Self-pay | Admitting: Obstetrics and Gynecology

## 2019-05-26 ENCOUNTER — Ambulatory Visit (INDEPENDENT_AMBULATORY_CARE_PROVIDER_SITE_OTHER): Payer: BC Managed Care – PPO | Admitting: Obstetrics and Gynecology

## 2019-05-26 VITALS — BP 110/76 | HR 80 | Temp 97.5°F | Ht 65.0 in | Wt 112.8 lb

## 2019-05-26 DIAGNOSIS — Z3009 Encounter for other general counseling and advice on contraception: Secondary | ICD-10-CM | POA: Diagnosis not present

## 2019-05-26 DIAGNOSIS — N911 Secondary amenorrhea: Secondary | ICD-10-CM

## 2019-05-26 DIAGNOSIS — Z01419 Encounter for gynecological examination (general) (routine) without abnormal findings: Secondary | ICD-10-CM

## 2019-05-26 DIAGNOSIS — Z113 Encounter for screening for infections with a predominantly sexual mode of transmission: Secondary | ICD-10-CM

## 2019-05-26 MED ORDER — NORETHIN ACE-ETH ESTRAD-FE 1-20 MG-MCG PO TABS: 1.0000 | ORAL_TABLET | Freq: Every day | ORAL | 0 refills | Status: DC

## 2019-05-26 NOTE — Patient Instructions (Addendum)
EXERCISE AND DIET:  We recommended that you start or continue a regular exercise program for good health. Regular exercise means any activity that makes your heart beat faster and makes you sweat.  We recommend exercising at least 30 minutes per day at least 3 days a week, preferably 4 or 5.  We also recommend a diet low in fat and sugar.  Inactivity, poor dietary choices and obesity can cause diabetes, heart attack, stroke, and kidney damage, among others.   ° °ALCOHOL AND SMOKING:  Women should limit their alcohol intake to no more than 7 drinks/beers/glasses of wine (combined, not each!) per week. Moderation of alcohol intake to this level decreases your risk of breast cancer and liver damage. And of course, no recreational drugs are part of a healthy lifestyle.  And absolutely no smoking or even second hand smoke. Most people know smoking can cause heart and lung diseases, but did you know it also contributes to weakening of your bones? Aging of your skin?  Yellowing of your teeth and nails? ° °CALCIUM AND VITAMIN D:  Adequate intake of calcium and Vitamin D are recommended.  The recommendations for exact amounts of these supplements seem to change often, but generally speaking 1,000 mg of calcium (between diet and supplement) and 800 units of Vitamin D per day seems prudent. Certain women may benefit from higher intake of Vitamin D.  If you are among these women, your doctor will have told you during your visit.   ° °PAP SMEARS:  Pap smears, to check for cervical cancer or precancers,  have traditionally been done yearly, although recent scientific advances have shown that most women can have pap smears less often.  However, every woman still should have a physical exam from her gynecologist every year. It will include a breast check, inspection of the vulva and vagina to check for abnormal growths or skin changes, a visual exam of the cervix, and then an exam to evaluate the size and shape of the uterus and  ovaries.  And after 28 years of age, a rectal exam is indicated to check for rectal cancers. We will also provide age appropriate advice regarding health maintenance, like when you should have certain vaccines, screening for sexually transmitted diseases, bone density testing, colonoscopy, mammograms, etc.  ° °MAMMOGRAMS:  All women over 40 years old should have a yearly mammogram. Many facilities now offer a "3D" mammogram, which may cost around $50 extra out of pocket. If possible,  we recommend you accept the option to have the 3D mammogram performed.  It both reduces the number of women who will be called back for extra views which then turn out to be normal, and it is better than the routine mammogram at detecting truly abnormal areas.   ° °COLON CANCER SCREENING: Now recommend starting at age 45. At this time colonoscopy is not covered for routine screening until 50. There are take home tests that can be done between 45-49.  ° °COLONOSCOPY:  Colonoscopy to screen for colon cancer is recommended for all women at age 50.  We know, you hate the idea of the prep.  We agree, BUT, having colon cancer and not knowing it is worse!!  Colon cancer so often starts as a polyp that can be seen and removed at colonscopy, which can quite literally save your life!  And if your first colonoscopy is normal and you have no family history of colon cancer, most women don't have to have it again for   10 years.  Once every ten years, you can do something that may end up saving your life, right?  We will be happy to help you get it scheduled when you are ready.  Be sure to check your insurance coverage so you understand how much it will cost.  It may be covered as a preventative service at no cost, but you should check your particular policy.   ° ° ° °Breast Self-Awareness °Breast self-awareness means being familiar with how your breasts look and feel. It involves checking your breasts regularly and reporting any changes to your  health care provider. °Practicing breast self-awareness is important. A change in your breasts can be a sign of a serious medical problem. Being familiar with how your breasts look and feel allows you to find any problems early, when treatment is more likely to be successful. All women should practice breast self-awareness, including women who have had breast implants. °How to do a breast self-exam °One way to learn what is normal for your breasts and whether your breasts are changing is to do a breast self-exam. To do a breast self-exam: °Look for Changes ° °1. Remove all the clothing above your waist. °2. Stand in front of a mirror in a room with good lighting. °3. Put your hands on your hips. °4. Push your hands firmly downward. °5. Compare your breasts in the mirror. Look for differences between them (asymmetry), such as: °? Differences in shape. °? Differences in size. °? Puckers, dips, and bumps in one breast and not the other. °6. Look at each breast for changes in your skin, such as: °? Redness. °? Scaly areas. °7. Look for changes in your nipples, such as: °? Discharge. °? Bleeding. °? Dimpling. °? Redness. °? A change in position. °Feel for Changes °Carefully feel your breasts for lumps and changes. It is best to do this while lying on your back on the floor and again while sitting or standing in the shower or tub with soapy water on your skin. Feel each breast in the following way: °· Place the arm on the side of the breast you are examining above your head. °· Feel your breast with the other hand. °· Start in the nipple area and make ¾ inch (2 cm) overlapping circles to feel your breast. Use the pads of your three middle fingers to do this. Apply light pressure, then medium pressure, then firm pressure. The light pressure will allow you to feel the tissue closest to the skin. The medium pressure will allow you to feel the tissue that is a little deeper. The firm pressure will allow you to feel the tissue  close to the ribs. °· Continue the overlapping circles, moving downward over the breast until you feel your ribs below your breast. °· Move one finger-width toward the center of the body. Continue to use the ¾ inch (2 cm) overlapping circles to feel your breast as you move slowly up toward your collarbone. °· Continue the up and down exam using all three pressures until you reach your armpit. ° °Write Down What You Find ° °Write down what is normal for each breast and any changes that you find. Keep a written record with breast changes or normal findings for each breast. By writing this information down, you do not need to depend only on memory for size, tenderness, or location. Write down where you are in your menstrual cycle, if you are still menstruating. °If you are having trouble noticing differences   in your breasts, do not get discouraged. With time you will become more familiar with the variations in your breasts and more comfortable with the exam. °How often should I examine my breasts? °Examine your breasts every month. If you are breastfeeding, the best time to examine your breasts is after a feeding or after using a breast pump. If you menstruate, the best time to examine your breasts is 5-7 days after your period is over. During your period, your breasts are lumpier, and it may be more difficult to notice changes. °When should I see my health care provider? °See your health care provider if you notice: °· A change in shape or size of your breasts or nipples. °· A change in the skin of your breast or nipples, such as a reddened or scaly area. °· Unusual discharge from your nipples. °· A lump or thick area that was not there before. °· Pain in your breasts. °· Anything that concerns you. ° °Oral Contraception Information °Oral contraceptive pills (OCPs) are medicines taken to prevent pregnancy. OCPs are taken by mouth, and they work by: °· Preventing the ovaries from releasing eggs. °· Thickening mucus in  the lower part of the uterus (cervix), which prevents sperm from entering the uterus. °· Thinning the lining of the uterus (endometrium), which prevents a fertilized egg from attaching to the endometrium. °OCPs are highly effective when taken exactly as prescribed. However, OCPs do not prevent STIs (sexually transmitted infections). Safe sex practices, such as using condoms while on an OCP, can help prevent STIs. °Before starting OCPs °Before you start taking OCPs, you may have a physical exam, blood test, and Pap test. However, you are not required to have a pelvic exam in order to be prescribed OCPs. Your health care provider will make sure you are a good candidate for oral contraception. OCPs are not a good option for certain women, including women who smoke and are older than 35 years, and women with a medical history of high blood pressure, deep vein thrombosis, pulmonary embolism, stroke, cardiovascular disease, or peripheral vascular disease. °Discuss with your health care provider the possible side effects of the OCP you may be prescribed. When you start an OCP, be aware that it can take 2-3 months for your body to adjust to changes in hormone levels. °Follow instructions from your health care provider about how to start taking your first cycle of OCPs. Depending on when you start the pill, you may need to use a backup form of birth control, such as condoms, during the first week. Make sure you know what steps to take if you ever forget to take the pill. °Types of oral contraception ° °The most common types of birth control pills contain the hormones estrogen and progestin (synthetic progesterone) or progestin only. °The combination pill °This type of pill contains estrogen and progestin hormones. Combination pills often come in packs of 21, 28, or 91 pills. For each pack, the last 7 pills may not contain hormones, which means you may stop taking the pills for 7 days. Menstrual bleeding occurs during the  week that you do not take the pills or that you take the pills with no hormones in them. °The minipill °This type of pill contains the progestin hormone only. It comes in packs of 28 pills. All 28 pills contain the hormone. You take the pill every day. It is very important to take the pill at the same time each day. °Advantages of oral contraceptive pills °·   Provides reliable and continuous contraception if taken as instructed. °· May treat or decrease symptoms of: °? Menstrual period cramps. °? Irregular menstrual cycle or bleeding. °? Heavy menstrual flow. °? Abnormal uterine bleeding. °? Acne, depending on the type of pill. °? Polycystic ovarian syndrome. °? Endometriosis. °? Iron deficiency anemia. °? Premenstrual symptoms, including premenstrual dysphoric disorder. °· May reduce the risk of endometrial and ovarian cancer. °· Can be used as emergency contraception. °· Prevents mislocated (ectopic) pregnancies and infections of the fallopian tubes. °Things that can make oral contraceptive pills less effective °OCPs can be less effective if: °· You forget to take the pill at the same time every day. This is especially important when taking the minipill. °· You have a stomach or intestinal disease that reduces your body's ability to absorb the pill. °· You take OCPs with other medicines that make OCPs less effective, such as antibiotics, certain HIV medicines, and some seizure medicines. °· You take expired OCPs. °· You forget to restart the pill on day 7, if using the packs of 21 pills. °Risks associated with oral contraceptive pills °Oral contraceptive pills can sometimes cause side effects, such as: °· Headache. °· Depression. °· Trouble sleeping. °· Nausea and vomiting. °· Breast tenderness. °· Irregular bleeding or spotting during the first several months. °· Bloating or fluid retention. °· Increase in blood pressure. °Combination pills are also associated with a small increase in the risk of: °· Blood  clots. °· Heart attack. °· Stroke. °Summary °· Oral contraceptive pills are medicines taken by mouth to prevent pregnancy. They are highly effective when taken exactly as prescribed. °· The most common types of birth control pills contain the hormones estrogen and progestin (synthetic progesterone) or progestin only. °· Before you start taking the pill, you may have a physical exam, blood test, and Pap test. Your health care provider will make sure you are a good candidate for oral contraception. °· The combination pill may come in a 21-day pack, a 28-day pack, or a 91-day pack. The minipill contains the progesterone hormone only and comes in packs of 28 pills. °· Oral contraceptive pills can sometimes cause side effects, such as headache, nausea, breast tenderness, or irregular bleeding. °This information is not intended to replace advice given to you by your health care provider. Make sure you discuss any questions you have with your health care provider. °Document Released: 08/31/2002 Document Revised: 05/23/2017 Document Reviewed: 09/03/2016 °[employer]vier Patient Education © 2020 [employer] ° °

## 2019-05-27 LAB — CHLAMYDIA/GONOCOCCUS/TRICHOMONAS, NAA
Chlamydia by NAA: NEGATIVE
Gonococcus by NAA: NEGATIVE
Trich vag by NAA: NEGATIVE

## 2019-05-27 LAB — HIV ANTIBODY (ROUTINE TESTING W REFLEX): HIV Screen 4th Generation wRfx: NONREACTIVE

## 2019-05-27 LAB — RPR: RPR Ser Ql: NONREACTIVE

## 2019-05-28 DIAGNOSIS — Z20828 Contact with and (suspected) exposure to other viral communicable diseases: Secondary | ICD-10-CM | POA: Diagnosis not present

## 2019-05-31 DIAGNOSIS — F419 Anxiety disorder, unspecified: Secondary | ICD-10-CM | POA: Diagnosis not present

## 2019-06-09 DIAGNOSIS — Z20828 Contact with and (suspected) exposure to other viral communicable diseases: Secondary | ICD-10-CM | POA: Diagnosis not present

## 2019-06-14 DIAGNOSIS — F419 Anxiety disorder, unspecified: Secondary | ICD-10-CM | POA: Diagnosis not present

## 2019-06-22 DIAGNOSIS — Z20828 Contact with and (suspected) exposure to other viral communicable diseases: Secondary | ICD-10-CM | POA: Diagnosis not present

## 2019-06-28 ENCOUNTER — Encounter: Payer: Self-pay | Admitting: Internal Medicine

## 2019-06-28 ENCOUNTER — Other Ambulatory Visit: Payer: Self-pay

## 2019-06-28 ENCOUNTER — Ambulatory Visit (INDEPENDENT_AMBULATORY_CARE_PROVIDER_SITE_OTHER): Payer: BC Managed Care – PPO | Admitting: Internal Medicine

## 2019-06-28 DIAGNOSIS — R82998 Other abnormal findings in urine: Secondary | ICD-10-CM

## 2019-06-28 DIAGNOSIS — E039 Hypothyroidism, unspecified: Secondary | ICD-10-CM | POA: Diagnosis not present

## 2019-06-28 DIAGNOSIS — N912 Amenorrhea, unspecified: Secondary | ICD-10-CM

## 2019-06-28 NOTE — Patient Instructions (Signed)
Please come back for a follow-up appointment in 1 year.  Please continue the oral contraceptives.  Please schedule an appointment with a nutritionist, as discussed.

## 2019-06-28 NOTE — Progress Notes (Signed)
Patient ID: Kim Myers, female   DOB: 21-Sep-1990, 29 y.o.   MRN: [id number]   Patient location: Home My location: Office  I connected with the patient on 06/28/19 at  2:11 PM EDT by a video enabled telemedicine application and verified that I am speaking with the correct person.   I discussed the limitations of evaluation and management by telemedicine and the availability of in person appointments. The patient expressed understanding and agreed to proceed.   Details of the encounter are shown below.  HPI  Kim Myers is a 29 y.o.-year-old female, initially referred by her ObGyn Dr., Dr. Oscar La, for management of amenorrhea and hypothyroidism.  She moved to [place] from [place], [place].  Last visit 3 months ago.  Since last visit, she had an admission for SBO and had to have exploratory laparotomy on 04/14/2019. Her weight has decreased afterwards and the last documented weight is 112 lbs, ~20 pounds lighter than the previous weight.  She is still struggling to gain weight and is currently regaining her appetite.  She currently weighs approximately 115 lbs.   She was started on OCP recently by OB/GYN since she did not regain her menstrual cycles.  Transient hypothyroidism:  Her hypothyroidism resolved: Lab Results  Component Value Date   TSH 1.861 04/15/2019   TSH 4.970 (H) 04/14/2019   TSH 0.85 12/29/2018   TSH 1.04 02/27/2018   TSH 6.270 (H) 12/22/2017   FREET4 1.18 (H) 04/15/2019   FREET4 1.32 (H) 04/14/2019   FREET4 0.61 12/29/2018   FREET4 0.78 02/27/2018   FREET4 0.98 12/22/2017   T3FREE 2.7 12/29/2018   T3FREE 2.7 02/27/2018   T3FREE 1.8 (L) 12/22/2017   Her thyroid antibodies were negative: Component     Latest Ref Rng & Units 02/27/2018  Thyroglobulin Ab     < or = 1 IU/mL <1  Thyroperoxidase Ab SerPl-aCnc     <9 IU/mL <1   She has no FH of thyroid disorders. No FH of thyroid cancer. No h/o radiation tx to head or neck.  No herbal supplements. No Biotin  use. No recent steroids use.   Amenorrhea:  Menarche: 61 or 29 years old Menses were normal after 2017 (they decreased in frequency after she started to workout more) -she also lost weight: 140s >> 120s. She was started on Provera tablets after she had the above labs drawn.  This did not work. At last visit, in 12/2018, she was telling me that she just had a menstrual cycle.  At our first visit in 02/2018 she was telling me that she was not eating gluten or refined sugars, eats goat cheese, humus, fresh veggies, large amounts cabbage + broccoli >> but stopped after her TFTs returned abnormal.  However, at our visit from 08/2018, she mentions that she started to work with her counselor Lollie Sails) and became more open with her dietary restrictions.  She was actually restricting her diet a lot and was learning how to relax this restriction.  At her last visit in 12/2018 she reintroduced snacks and had a weight gain of 5 pounds.  She had only spotting in 03/2019.  In the past, she was doing CrossFit, and intense running for 5/7 days, lifting weight  For 5 years, then slow down.  At last visit and again now, she mentions that she is exercising 30 minutes to an hour every day: - walking - yoga  Previously: - running  - yoga - stand up paddle boarding - rowing  Reviewed  previous labs: - Mild anemia: hemoglobin 11 (11.1-15.9), normal MCV - Vitamin D deficiency: Vitamin D 15 - Hyponatremia: Sodium 127 (134-144), for a glucose of 86.  Chloride was also low, at 90 (96-106) - Elevated total cholesterol and LDL (201/104) - Low alkaline phosphatase of 37 (39-117) - Low estradiol <5.0, with inappropriately normal FSH.  However, she had normal prolactin, kidney function, B12, ferritin, folate. She was tested for celiac ds (EGD): negative - 2017.  02/27/2018: Normal testosterone, low estradiol, normal LH and FSH, Normal 17 hydroxyprogesterone Component     Latest Ref Rng & Units 02/27/2018  03/17/2018  Testosterone     8 - 48 ng/dL 44   Testosterone Free     0.0 - 4.2 pg/mL 4.1   Sex Horm Binding Glob, Serum     24.6 - 122.0 nmol/L 71.4   Estradiol, Free     pg/mL 0.13   Estradiol     pg/mL 7   LH     mIU/mL 0.17   FSH     mIU/ML 2.9   Cortisol, Plasma     ug/dL  56.222.4  [id number] ACTH     6 - 50 pg/mL  60 (H)  DHEA-Sulfate, LCMS     ug/dL  657522 (H)   Patient was called back for a cosyntropin stimulation test to rule out adrenal insufficiency.  Her cortisol level was slightly high from the beginning and they did not increase much.  Moreover, her ACTH level was elevated: Component     Latest Ref Rng & Units 03/17/2018 03/17/2018 03/17/2018         8:33 AM  9:12 AM  9:44 AM  Cortisol, Plasma     ug/dL 84.622.4 96.224.1 95.225.2  [id number] ACTH     6 - 50 pg/mL 60 (H)     After she improved her diet, her cortisol, ACTH, were normal at last check: Component     Latest Ref Rng & Units 12/29/2018          Cortisol, Plasma     ug/dL 24.414.4  [id number] ACTH     6 - 50 pg/mL 18   Her DHEA-S level was high, but improved after she relaxed her diet: Component     Latest Ref Rng & Units 03/17/2018 08/26/2018 12/29/2018  DHEA-Sulfate, LCMS     ug/dL 725522 (H) 366448 (H) 440275   Dexamethasone suppression test showed a nonsuppressed cortisol level, however, the dexamethasone level was lower than expected, so patient is likely a fast metabolizer.  In this situation, the DFT test is uninterpretable. Component     Latest Ref Rng & Units 04/01/2018  Dexamethasone, Blood     ng/dL 47  Cortisol     ug/dL 34.717.6   Component     Latest Ref Rng & Units 08/26/2018          Estradiol, Free     pg/mL 0.21  Estradiol     pg/mL 13  FSH     mIU/ML 5.9  LH     mIU/mL 0.82  Cortisol, Plasma     ug/dL 42.516.2  [id number] ACTH     6 - 50 pg/mL 31  DHEA-Sulfate, LCMS     ug/dL 875448 (H)   LH, estradiol, were improved at last check: Component     Latest Ref Rng & Units 12/29/2018  Estradiol     pg/mL 23  LH     mIU/mL 5.53    Component     Latest Ref Rng &  Units 12/22/2017 02/27/2018 08/26/2018  Estradiol, Free     pg/mL  0.13 0.21  Estradiol     pg/mL <5.0 7 13  FSH     mIU/ML 4.1 2.9 5.9  LH     mIU/mL  0.17 0.82    Her 24-hour urine cortisol was high, although I suspected that this was due to dietary restriction.  Indeed, after relaxing her diet and gaining weight, at last visit, 24-hour urine cortisol was normal:  Component     Latest Ref Rng & Units 12/31/2018  Total Volume     ML  6,050  Cortisol (Ur), Free     4.0 - 50.0 mcg/24 h 35.7  Cortisol, Free ratio to CRT     mcg/g creat 19.8  Results received     0.50 - 2.15 g/24 h 1.80   Component     Latest Ref Rng & Units 09/01/2018  24 Hour urine volume (VMAHVA)     mL 3,500  Cortisol (Ur), Free     4.0 - 50.0 mcg/24 h 98.6 (H)  Results received     0.50 - 2.15 g/24 h 1.44   She is on iron and vitamin D.  ROS: Constitutional: no weight gain/+ weight loss, no fatigue, no subjective hyperthermia, no subjective hypothermia Eyes: no blurry vision, no xerophthalmia ENT: no sore throat, no nodules palpated in neck, no dysphagia, no odynophagia, no hoarseness Cardiovascular: no CP/no SOB/no palpitations/no leg swelling Respiratory: no cough/no SOB/no wheezing Gastrointestinal: no N/no V/no D/no C/no acid reflux Musculoskeletal: no muscle aches/no joint aches  Skin: no rashes, no hair loss Neurological: no tremors/no numbness/no tingling/no dizziness  I reviewed pt's medications, allergies, PMH, social hx, family hx, and changes were documented in the history of present illness. Otherwise, unchanged from my initial visit note.  Past Medical History:  Diagnosis Date  . Amenorrhea   . Anemia   . Blood transfusion without reported diagnosis   . Gluten intolerance   . History of anemia    resolved with dietary changes  . Hypothyroidism (acquired) 03/25/2018   Past Surgical History:  Procedure Laterality Date  . ILEOCECETOMY  04/20/2019    Procedure: Ileocecetomy;  Surgeon: Andria Meuse, MD;  Location: [facility];  Service: General;;  . LAPAROTOMY N/A 04/20/2019   Procedure: EXPLORATORY LAPAROTOMY,  SMALL BOWEL RESECTION, partial omentectomy, drainage of abdominal abscess;  Surgeon: Andria Meuse, MD;  Location: [facility];  Service: General;  Laterality: N/A;  . LYSIS OF ADHESION  04/20/2019   Procedure: Lysis Of Adhesion;  Surgeon: Andria Meuse, MD;  Location: [facility];  Service: General;;  . MECKEL DIVERTICULUM EXCISION  at 19   started as laparoscopy, ended in laparotomy   Social History   Socioeconomic History  . Marital status: Single    Spouse name: Not on file  . Number of children: 0  . Years of education: Not on file  . Highest education level: Not on file  Occupational History  .  Barista  Tobacco Use  . Smoking status: Never Smoker  . Smokeless tobacco: Never Used  Substance and Sexual Activity  . Alcohol use: No  . Drug use: No  . Sexual activity: Not Currently    Birth control/protection:  She is not sexually active right now   Current Outpatient Medications on File Prior to Visit  Medication Sig Dispense Refill  . norethindrone-ethinyl estradiol (LOESTRIN FE) 1-20 MG-MCG tablet Take 1 tablet by mouth daily. 3 Package 0   No current  facility-administered medications on file prior to visit.   Allergies  Allergen Reactions  . Gluten Meal Diarrhea, Nausea And Vomiting and Other (See Comments)    Caused internal bleeding that resulted in hospitalization!!  . Cauliflower [Brassica Oleracea]   . Food     Red Grape Apple   . Peanut-Containing Drug Products    Family History  Problem Relation Age of Onset  . Breast cancer Paternal Grandmother   . Ovarian cancer Maternal Grandmother     PE: There were no vitals taken for this visit. Wt Readings from Last 3 Encounters:  05/26/19 112 lb 12.8 oz (51.2 kg)  04/28/19 148 lb 1.6 oz (67.2 kg)  08/25/18 131 lb (59.4 kg)   Constitutional:   in NAD  The physical exam was not performed (virtual visit).  ASSESSMENT: 1. Hypothyroidism  2.  Hypogonadotropic hypogonadism/hypothalamic amenorrhea  3.  Elevated urinary cortisol  PLAN:  1. Patient with history of mild hypothyroidism - TFTs normalized at last check - during her recent hospitalization she had a slightly high TSH, but the next day this was normal. -I reviewed her previous ATA and TPO antibodies and these were not elevated -We will check her TFTs at next visit  2.  Hypogonadotropic hypogonadism -Patient with a history of regular cycles until 4 years ago when they become less frequent and finally stopped.  At that time, she was exercising intensively and had also lost 20 pounds from 140 to 120 pounds.  Labs checked by OB/GYN showed a low estradiol.  We investigated her further by ruling out PCOS (normal testosterone), [place]-CAH (normal 17 hydroxyprogesterone), prolactinoma (normal prolactin level), hypo-or hyperthyroidism (normal TFTs and normal thyroid antibodies).  She did have a normal cortisol level but an elevated ACTH and a DHEA-S at that time.  A cosyntropin stimulation test was normal, but she had an unchanged cortisol from 0 to 60 minutes.  A dexamethasone suppression test was not accurate for her since she was a fast dexamethasone metabolizer.  A 24-hour urine cortisol was elevated but without any signs or symptoms of Cushing syndrome.  Her estrogen was low but improving.  LH and FSH were normal. - Her labs were a little perplexing but she revealed to me after the above investigation that she was restricting eating and exercising excessively in the past but started to work with a therapist and was trying to relax her diet as she already started to exercise less.  We repeated her labs in 12/2018 and all of them were normal!  Her weight was above 130 pounds and I strongly encouraged her to continue to gain weight at least 140 pounds when she had her last menstrual cycle.  At  last visit she was also telling me that she started to have spotting. -Unfortunately, since then, she had a setback with an episode of small bowel obstruction for which she was admitted in 03/2019.  Latest documented weight was 148 pounds before this but she mentions that she was retaining a lot of water at that time.  After her hospitalization, she got down to 112 pounds, which she is not trying to correct.  She only gained 3 pounds since then.  She is only walking and doing yoga, so no excessive exercise.  She is interested in seeing a nutritionist and I offered to refer her to on it, but this may not be covered by her insurance.  She will let me know if I need to refer her somewhere else. -She tells me that  she did not have menstrual cycles after her last visit and she was recently started on OCPs by OB/GYN.  I strongly agree with this and we discussed that we can try to stop them for at least 6 months after she gains weight to approximately 140 lbs to see if her menstrual cycles restart. -I will see the patient in 1 year  3.  Elevated urinary cortisol -Patient had one instance of an elevated 24-hour urine cortisol in 08/2018.  I considered that this was most likely due to dietary restriction in the past especially since she has absolutely no physical manifestations of Cushing syndrome.  Therefore, we did not intervene but rechecked her 24-hour urine cortisol at last visit, in 12/2018 and this was completely normal. -No further investigation is needed for this   Philemon Kingdom, MD PhD [facility]

## 2019-06-29 DIAGNOSIS — F419 Anxiety disorder, unspecified: Secondary | ICD-10-CM | POA: Diagnosis not present

## 2019-06-30 DIAGNOSIS — Z20828 Contact with and (suspected) exposure to other viral communicable diseases: Secondary | ICD-10-CM | POA: Diagnosis not present

## 2019-07-27 DIAGNOSIS — Z713 Dietary counseling and surveillance: Secondary | ICD-10-CM | POA: Diagnosis not present

## 2019-07-30 DIAGNOSIS — H524 Presbyopia: Secondary | ICD-10-CM | POA: Diagnosis not present

## 2019-08-02 DIAGNOSIS — F419 Anxiety disorder, unspecified: Secondary | ICD-10-CM | POA: Diagnosis not present

## 2019-08-03 DIAGNOSIS — Z713 Dietary counseling and surveillance: Secondary | ICD-10-CM | POA: Diagnosis not present

## 2019-08-04 DIAGNOSIS — H5210 Myopia, unspecified eye: Secondary | ICD-10-CM | POA: Diagnosis not present

## 2019-08-10 ENCOUNTER — Other Ambulatory Visit: Payer: Self-pay | Admitting: Obstetrics and Gynecology

## 2019-08-10 NOTE — Telephone Encounter (Signed)
Medication refill request: Junel  Last AEX:  05-26-2019 JJ  3 month follow up: 08-25-19 Last MMG (if hormonal medication request): n/a Refill authorized: Today, please advise.   Medication pended for #28, 0RF. Please refill if appropriate.

## 2019-08-17 DIAGNOSIS — Z713 Dietary counseling and surveillance: Secondary | ICD-10-CM | POA: Diagnosis not present

## 2019-08-18 DIAGNOSIS — Z20828 Contact with and (suspected) exposure to other viral communicable diseases: Secondary | ICD-10-CM | POA: Diagnosis not present

## 2019-08-23 DIAGNOSIS — F419 Anxiety disorder, unspecified: Secondary | ICD-10-CM | POA: Diagnosis not present

## 2019-08-24 DIAGNOSIS — Z713 Dietary counseling and surveillance: Secondary | ICD-10-CM | POA: Diagnosis not present

## 2019-08-25 ENCOUNTER — Other Ambulatory Visit: Payer: Self-pay

## 2019-08-25 ENCOUNTER — Ambulatory Visit: Payer: BC Managed Care – PPO | Admitting: Obstetrics and Gynecology

## 2019-08-25 ENCOUNTER — Encounter: Payer: Self-pay | Admitting: Obstetrics and Gynecology

## 2019-08-25 VITALS — BP 100/60 | HR 80 | Temp 98.4°F | Ht 65.0 in | Wt 115.0 lb

## 2019-08-25 DIAGNOSIS — Z3041 Encounter for surveillance of contraceptive pills: Secondary | ICD-10-CM | POA: Diagnosis not present

## 2019-08-25 DIAGNOSIS — Z8742 Personal history of other diseases of the female genital tract: Secondary | ICD-10-CM

## 2019-08-25 MED ORDER — NORETHIN ACE-ETH ESTRAD-FE 1-20 MG-MCG PO TABS: 1.0000 | ORAL_TABLET | Freq: Every day | ORAL | 2 refills | Status: DC
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
## 2019-09-01 DIAGNOSIS — R109 Unspecified abdominal pain: Secondary | ICD-10-CM | POA: Diagnosis not present

## 2019-09-07 DIAGNOSIS — Z713 Dietary counseling and surveillance: Secondary | ICD-10-CM | POA: Diagnosis not present

## 2019-09-17 DIAGNOSIS — R109 Unspecified abdominal pain: Secondary | ICD-10-CM | POA: Diagnosis not present

## 2019-09-17 DIAGNOSIS — L905 Scar conditions and fibrosis of skin: Secondary | ICD-10-CM | POA: Diagnosis not present

## 2019-09-17 DIAGNOSIS — M791 Myalgia, unspecified site: Secondary | ICD-10-CM | POA: Diagnosis not present

## 2019-09-20 DIAGNOSIS — F419 Anxiety disorder, unspecified: Secondary | ICD-10-CM | POA: Diagnosis not present

## 2019-09-21 DIAGNOSIS — Z713 Dietary counseling and surveillance: Secondary | ICD-10-CM | POA: Diagnosis not present

## 2019-10-01 DIAGNOSIS — L905 Scar conditions and fibrosis of skin: Secondary | ICD-10-CM | POA: Diagnosis not present

## 2019-10-01 DIAGNOSIS — M791 Myalgia, unspecified site: Secondary | ICD-10-CM | POA: Diagnosis not present

## 2019-10-01 DIAGNOSIS — R109 Unspecified abdominal pain: Secondary | ICD-10-CM | POA: Diagnosis not present

## 2019-10-05 DIAGNOSIS — Z713 Dietary counseling and surveillance: Secondary | ICD-10-CM | POA: Diagnosis not present

## 2019-10-18 DIAGNOSIS — F419 Anxiety disorder, unspecified: Secondary | ICD-10-CM | POA: Diagnosis not present

## 2019-10-19 DIAGNOSIS — Z713 Dietary counseling and surveillance: Secondary | ICD-10-CM | POA: Diagnosis not present

## 2019-10-29 DIAGNOSIS — R109 Unspecified abdominal pain: Secondary | ICD-10-CM | POA: Diagnosis not present

## 2019-10-29 DIAGNOSIS — L905 Scar conditions and fibrosis of skin: Secondary | ICD-10-CM | POA: Diagnosis not present

## 2019-10-29 DIAGNOSIS — M791 Myalgia, unspecified site: Secondary | ICD-10-CM | POA: Diagnosis not present

## 2019-11-15 DIAGNOSIS — F419 Anxiety disorder, unspecified: Secondary | ICD-10-CM | POA: Diagnosis not present

## 2019-11-18 DIAGNOSIS — Z713 Dietary counseling and surveillance: Secondary | ICD-10-CM | POA: Diagnosis not present

## 2019-12-16 DIAGNOSIS — Z713 Dietary counseling and surveillance: Secondary | ICD-10-CM | POA: Diagnosis not present

## 2019-12-20 DIAGNOSIS — F419 Anxiety disorder, unspecified: Secondary | ICD-10-CM | POA: Diagnosis not present

## 2020-01-10 DIAGNOSIS — K432 Incisional hernia without obstruction or gangrene: Secondary | ICD-10-CM | POA: Diagnosis not present

## 2020-04-29 ENCOUNTER — Other Ambulatory Visit: Payer: Self-pay | Admitting: Obstetrics and Gynecology

## 2020-05-01 NOTE — Telephone Encounter (Signed)
Medication refill request: junel fe 1/20 Last AEX:  05-26-2019 Next AEX: 05-31-2020 Last MMG (if hormonal medication request): none Refill authorized: please approve if appropriate. Last refilled 08-25-2019 #84 with 2 refills.

## 2020-05-29 NOTE — Progress Notes (Signed)
29 y.o. G0P0000 Single White or Caucasian Not Hispanic or Latino female here for annual exam.   Period Cycle (Days): 28 Period Duration (Days): 4-7 Period Pattern: Regular Menstrual Flow: Light Menstrual Control: Tampon Menstrual Control Change Freq (Hours): 5 Dysmenorrhea: (!) Mild Dysmenorrhea Symptoms: Cramping   H/O eating disorder. She has been working with a nutritionist, has gained weight, doing well.  H/O hypothalamic amenorrhea, followed by Endocrinology. On OCP's. She is considering going off of OCP's, uses condoms regularly. She understands if she doesn't have cycles off of OCP's, she should go back on.    [name] was 05/24/2020.          Sexually active: Yes.    The current method of family planning is OCP (estrogen/progesterone).    Exercising: Yes.    Running and weight training, rowing  Smoker:  no  Health Maintenance: Pap: 12/22/2017 WNL  History of abnormal Pap:  no MMG:  None  BMD:   None  Colonoscopy: none  TDaP:  04/20/17  Gardasil: complete    reports that she has never smoked. She has never used smokeless tobacco. She reports current alcohol use. She reports that she does not use drugs. 2 drinks a week. 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  Past Medical History:  Diagnosis Date  . Amenorrhea   . Anemia   . Blood transfusion without reported diagnosis   . Gluten intolerance   . History of anemia    resolved with dietary changes  . History of eating disorder   . Hypothyroidism (acquired) 03/25/2018    Past Surgical History:  Procedure Laterality Date  . ILEOCECETOMY  04/20/2019   Procedure: Ileocecetomy;  Surgeon: Andria Meuse, MD;  Location: [facility];  Service: General;;  . LAPAROTOMY N/A 04/20/2019   Procedure: EXPLORATORY LAPAROTOMY,  SMALL BOWEL RESECTION, partial omentectomy, drainage of abdominal abscess;  Surgeon: Andria Meuse, MD;  Location: [facility];  Service: General;  Laterality: N/A;   . LYSIS OF ADHESION  04/20/2019   Procedure: Lysis Of Adhesion;  Surgeon: Andria Meuse, MD;  Location: [facility];  Service: General;;  . MECKEL DIVERTICULUM EXCISION  at 19   started as laparoscopy, ended in laparotomy    Current Outpatient Medications  Medication Sig Dispense Refill  . JUNEL FE 1/20 1-20 MG-MCG tablet TAKE 1 TABLET BY MOUTH EVERY DAY 84 tablet 0   No current facility-administered medications for this visit.    Family History  Problem Relation Age of Onset  . Breast cancer Paternal Grandmother   . Ovarian cancer Maternal Grandmother     Review of Systems  All other systems reviewed and are negative.   Exam:   BP 118/70   Pulse 66   Ht 5\' 5"  (1.651 m)   Wt 133 lb (60.3 kg)   LMP 05/24/2020   SpO2 99%   BMI 22.13 kg/m   Weight change: @WEIGHTCHANGE @ Height:   Height: 5\' 5"  (165.1 cm)  Ht Readings from Last 3 Encounters:  05/31/20 5\' 5"  (1.651 m)  08/25/19 5\' 5"  (1.651 m)  05/26/19 5\' 5"  (1.651 m)    General appearance: alert, cooperative and appears stated age Head: Normocephalic, without obvious abnormality, atraumatic Neck: no adenopathy, supple, symmetrical, trachea midline and thyroid normal to inspection and palpation Lungs: clear to auscultation bilaterally Cardiovascular: regular rate and rhythm Breasts: normal appearance, no masses or tenderness Abdomen: soft, non-tender; non distended,  no masses,  no organomegaly Extremities: extremities  normal, atraumatic, no cyanosis or edema Skin: Skin color, texture, turgor normal. No rashes or lesions Lymph nodes: Cervical, supraclavicular, and axillary nodes normal. No abnormal inguinal nodes palpated Neurologic: Grossly normal   Pelvic: External genitalia:  no lesions              Urethra:  normal appearing urethra with no masses, tenderness or lesions              Bartholins and Skenes: normal                 Vagina: normal appearing vagina with normal color and discharge, no lesions               Cervix: no lesions               Bimanual Exam:  Uterus:  normal size, contour, position, consistency, mobility, non-tender and anteverted              Adnexa: no mass, fullness, tenderness               Rectovaginal: Confirms               Anus:  normal sphincter tone, no lesions  Zenovia Jordan chaperoned for the exam.  A:  Well Woman with normal exam  H/O eating disorder, doing well  H/O hypothalamic amenorrhea  On OCP's  H/o thyroid disorder, normalized  H/O elevated triglycerides and creatinine last year while hospitalized and very ill.   P:   Pap with GC/CT  Screening labs (not fasting), TSH, HIV  Discussed breast self exam  Discussed calcium and vit D intake  Will refill ocp's, she may try stopping them to see if she cycles.   Continue condoms   Call with any concerns.

## 2020-05-31 ENCOUNTER — Other Ambulatory Visit: Payer: Self-pay

## 2020-05-31 ENCOUNTER — Other Ambulatory Visit (HOSPITAL_COMMUNITY)
Admission: RE | Admit: 2020-05-31 | Discharge: 2020-05-31 | Disposition: A | Discharge status: Home / Self Care | Payer: BC Managed Care – PPO | Source: Ambulatory Visit | Attending: Obstetrics and Gynecology | Admitting: Obstetrics and Gynecology

## 2020-05-31 ENCOUNTER — Ambulatory Visit (INDEPENDENT_AMBULATORY_CARE_PROVIDER_SITE_OTHER): Payer: BC Managed Care – PPO | Admitting: Obstetrics and Gynecology

## 2020-05-31 ENCOUNTER — Encounter: Payer: Self-pay | Admitting: Obstetrics and Gynecology

## 2020-05-31 VITALS — BP 118/70 | HR 66 | Ht 65.0 in | Wt 133.0 lb

## 2020-05-31 DIAGNOSIS — Z113 Encounter for screening for infections with a predominantly sexual mode of transmission: Secondary | ICD-10-CM | POA: Insufficient documentation

## 2020-05-31 DIAGNOSIS — Z124 Encounter for screening for malignant neoplasm of cervix: Secondary | ICD-10-CM | POA: Insufficient documentation

## 2020-05-31 DIAGNOSIS — Z8639 Personal history of other endocrine, nutritional and metabolic disease: Secondary | ICD-10-CM | POA: Diagnosis not present

## 2020-05-31 DIAGNOSIS — Z8659 Personal history of other mental and behavioral disorders: Secondary | ICD-10-CM | POA: Insufficient documentation

## 2020-05-31 DIAGNOSIS — Z3041 Encounter for surveillance of contraceptive pills: Secondary | ICD-10-CM | POA: Diagnosis not present

## 2020-05-31 DIAGNOSIS — N911 Secondary amenorrhea: Secondary | ICD-10-CM

## 2020-05-31 DIAGNOSIS — R7989 Other specified abnormal findings of blood chemistry: Secondary | ICD-10-CM

## 2020-05-31 DIAGNOSIS — Z01419 Encounter for gynecological examination (general) (routine) without abnormal findings: Secondary | ICD-10-CM | POA: Diagnosis not present

## 2020-05-31 DIAGNOSIS — Z0000 Encounter for general adult medical examination without abnormal findings: Secondary | ICD-10-CM

## 2020-05-31 NOTE — Patient Instructions (Signed)

## 2020-06-01 ENCOUNTER — Telehealth: Payer: Self-pay

## 2020-06-01 LAB — CYTOLOGY - PAP
Chlamydia: NEGATIVE
Comment: NEGATIVE
Comment: NORMAL
Diagnosis: NEGATIVE
Neisseria Gonorrhea: NEGATIVE

## 2020-06-01 LAB — LIPID PANEL
Chol/HDL Ratio: 3.4 ratio (ref 0.0–4.4)
Cholesterol, Total: 202 mg/dL — ABNORMAL HIGH (ref 100–199)
HDL: 60 mg/dL (ref >39)
LDL Chol Calc (NIH): 103 mg/dL — ABNORMAL HIGH (ref 0–99)
Triglycerides: 229 mg/dL — ABNORMAL HIGH (ref 0–149)
VLDL Cholesterol Cal: 39 mg/dL (ref 5–40)

## 2020-06-01 LAB — COMPREHENSIVE METABOLIC PANEL
ALT: 42 IU/L — ABNORMAL HIGH (ref 0–32)
AST: 24 IU/L (ref 0–40)
Albumin/Globulin Ratio: 1.9 (ref 1.2–2.2)
Albumin: 4.5 g/dL (ref 3.9–5.0)
Alkaline Phosphatase: 81 IU/L (ref 44–121)
BUN/Creatinine Ratio: 8 — ABNORMAL LOW (ref 9–23)
BUN: 8 mg/dL (ref 6–20)
Bilirubin Total: 0.3 mg/dL (ref 0.0–1.2)
CO2: 25 mmol/L (ref 20–29)
Calcium: 9.7 mg/dL (ref 8.7–10.2)
Chloride: 102 mmol/L (ref 96–106)
Creatinine, Ser: 0.97 mg/dL (ref 0.57–1.00)
GFR calc Af Amer: 91 mL/min/{1.73_m2} (ref >59)
GFR calc non Af Amer: 79 mL/min/{1.73_m2} (ref >59)
Globulin, Total: 2.4 g/dL (ref 1.5–4.5)
Glucose: 68 mg/dL (ref 65–99)
Potassium: 5 mmol/L (ref 3.5–5.2)
Sodium: 139 mmol/L (ref 134–144)
Total Protein: 6.9 g/dL (ref 6.0–8.5)

## 2020-06-01 LAB — CBC
Hematocrit: 37.1 % (ref 34.0–46.6)
Hemoglobin: 12.2 g/dL (ref 11.1–15.9)
MCH: 30.6 pg (ref 26.6–33.0)
MCHC: 32.9 g/dL (ref 31.5–35.7)
MCV: 93 fL (ref 79–97)
Platelets: 439 10*3/uL (ref 150–450)
RBC: 3.99 x10E6/uL (ref 3.77–5.28)
RDW: 12.3 % (ref 11.7–15.4)
WBC: 5.6 10*3/uL (ref 3.4–10.8)

## 2020-06-01 LAB — TSH: TSH: 2.03 u[IU]/mL (ref 0.450–4.500)

## 2020-06-01 LAB — HIV ANTIBODY (ROUTINE TESTING W REFLEX): HIV Screen 4th Generation wRfx: NONREACTIVE

## 2020-06-01 NOTE — Telephone Encounter (Signed)
-----   Message from Romualdo Bolk, MD sent at 06/01/2020  1:05 PM EST ----- Please let the patient know that one of her LFT's is mildly elevated and her triglycerides are elevated (more than I would expect from a meal). The rest of her blood work is normal. Her pap and cervical cultures are pending. I would recommend that she f/u with her primary for evaluation of her labs. She will likely need to do a fasting triglyceride level with her primary

## 2020-06-01 NOTE — Telephone Encounter (Signed)
Message not needed. °

## 2020-06-01 NOTE — Telephone Encounter (Signed)
Spoke with patient and notified of labs per Dr.Jertson's note below. Patient voiced understanding and will call PCP to make an appointment to follow up regarding elevated LFT and elevated triglycerides.

## 2020-06-28 ENCOUNTER — Ambulatory Visit: Payer: BC Managed Care – PPO | Admitting: Internal Medicine

## 2020-07-22 ENCOUNTER — Other Ambulatory Visit: Payer: Self-pay | Admitting: Obstetrics and Gynecology

## 2020-07-25 NOTE — Telephone Encounter (Signed)
Medication refill request: junel fe 1/20 Last AEX:  05-31-2020 Next AEX: 06-06-2021 Last MMG (if hormonal medication request): none Refill authorized: please approve if appropriate

## 2020-10-17 IMAGING — CT CT ABD-PELV W/O CM
2 of 4 series · 16 of 46 positions shown, 18 images · non-contrast
Comparison: April 14, 2019.

CLINICAL DATA: Low-grade bowel obstruction, abdominal distention.

EXAM:
CT ABDOMEN AND PELVIS WITHOUT CONTRAST
TECHNIQUE: Multidetector CT imaging of the abdomen and pelvis was performed
following the standard protocol without IV contrast.

[Series 3: a/p w/o 5mm · axial · non-contrast · 0.76mm/px · z∈[-390,+70]mm · 13 of 104 slices shown, 15 images]
[im 8/104  soft-tissue]
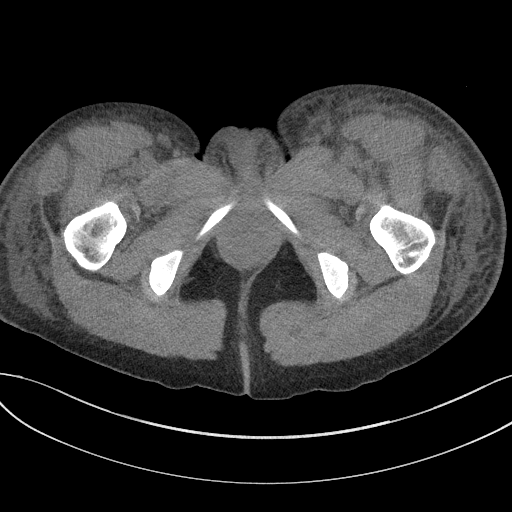
[im 8/104  bone]
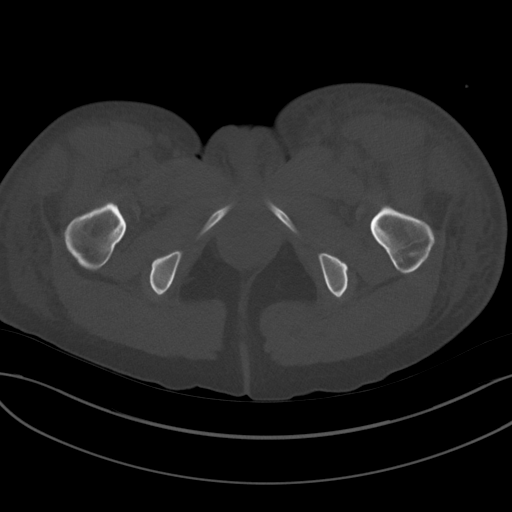
[im 16/104  soft-tissue]
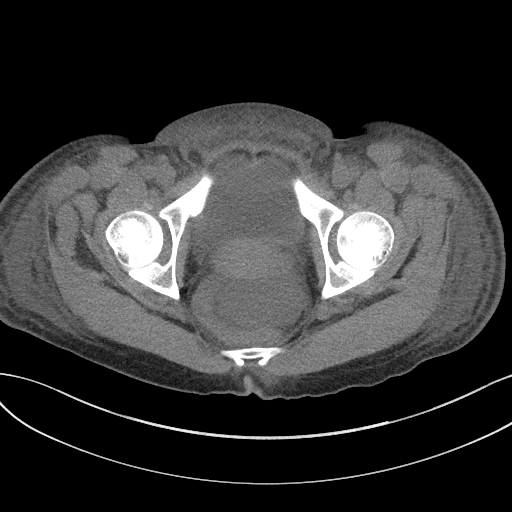
[im 23/104  soft-tissue]
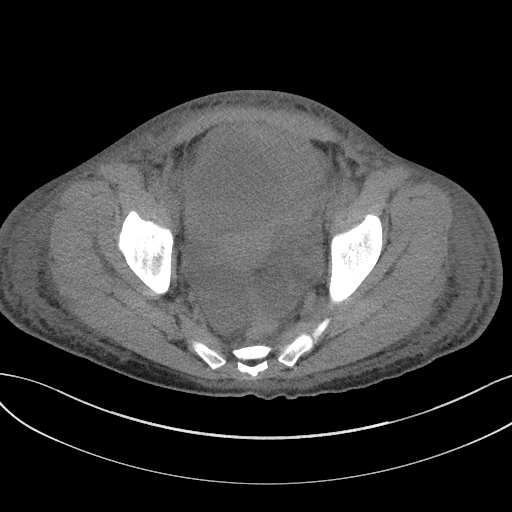
[im 31/104  soft-tissue]
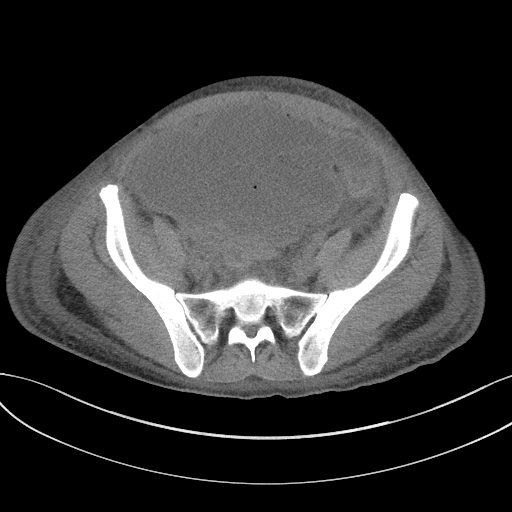
[im 39/104  soft-tissue]
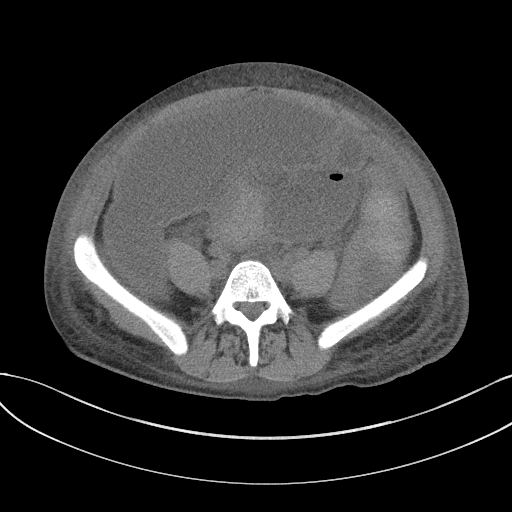
[im 46/104  soft-tissue]
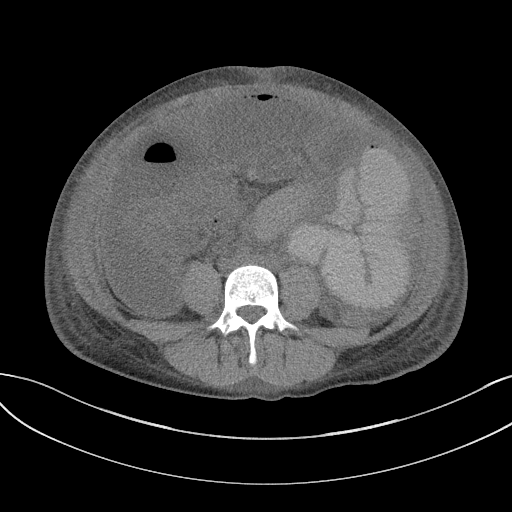
[im 54/104  soft-tissue]
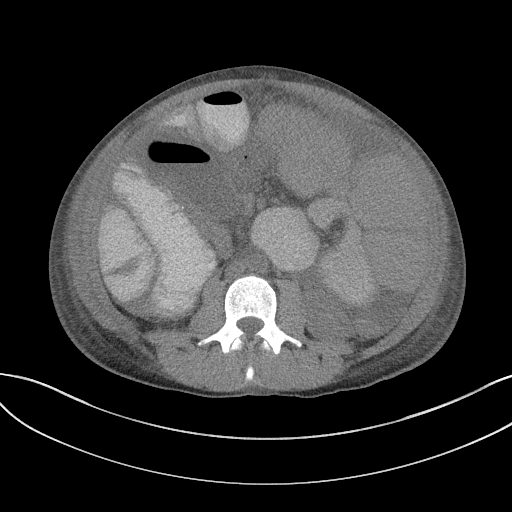
[im 62/104  soft-tissue]
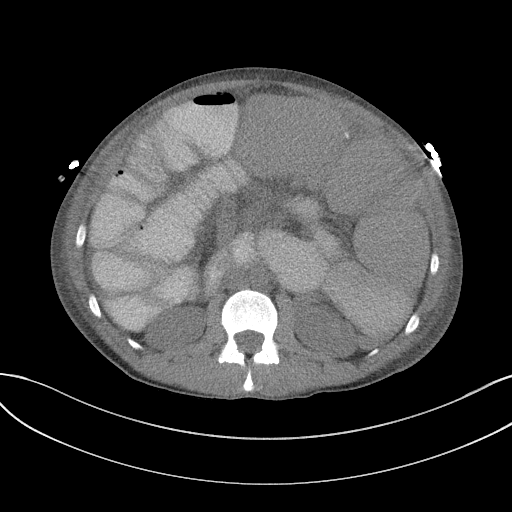
[im 69/104  soft-tissue]
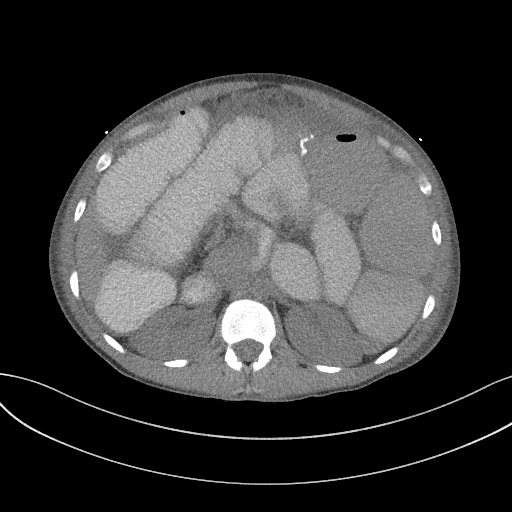
[im 69/104  bone]
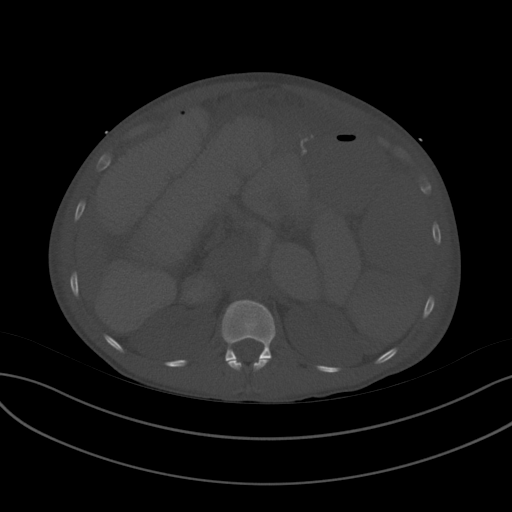
[im 77/104  soft-tissue]
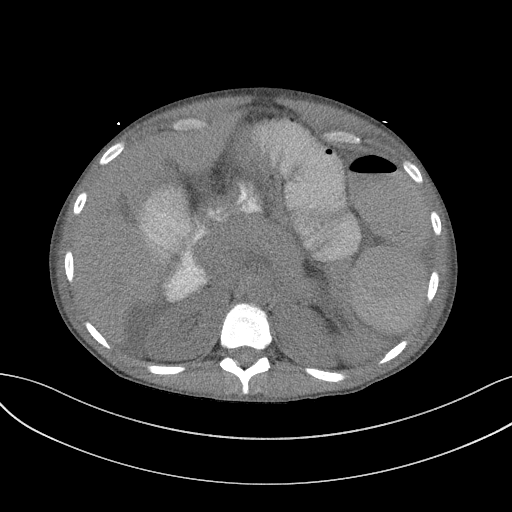
[im 84/104  soft-tissue]
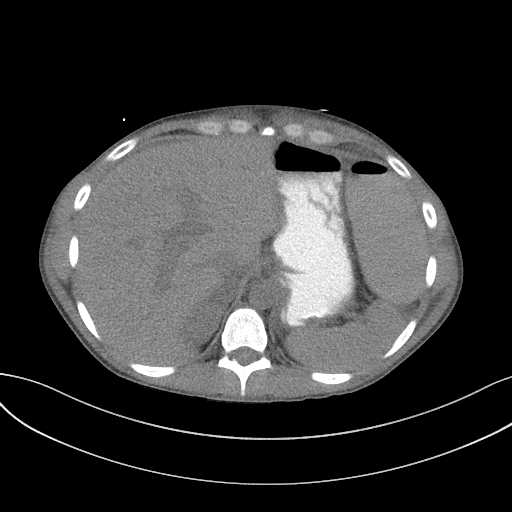
[im 92/104  soft-tissue]
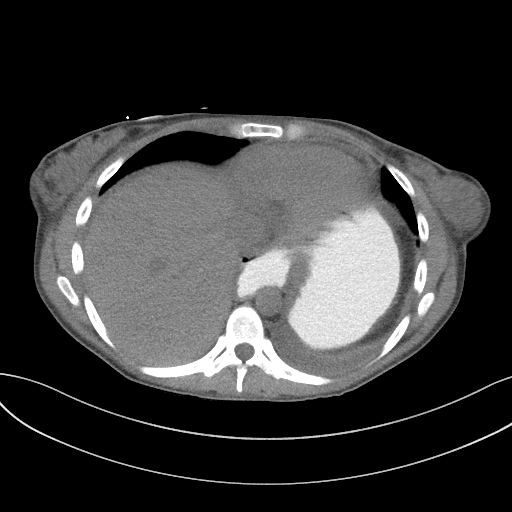
[im 100/104  soft-tissue]
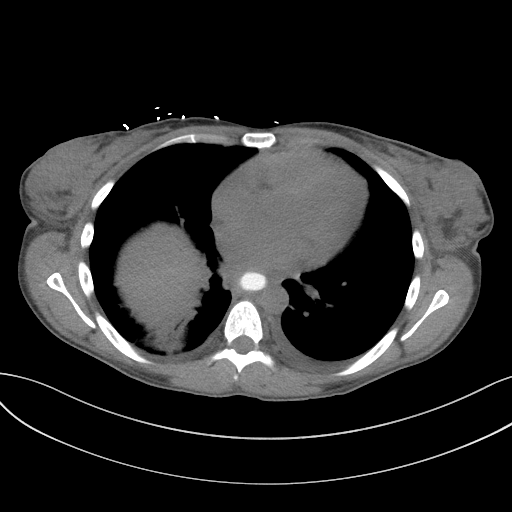

[Series 6: a/p w/o cor · coronal · non-contrast · 0.80mm/px · 3 of 151 slices shown]
[im 51/151  soft-tissue]
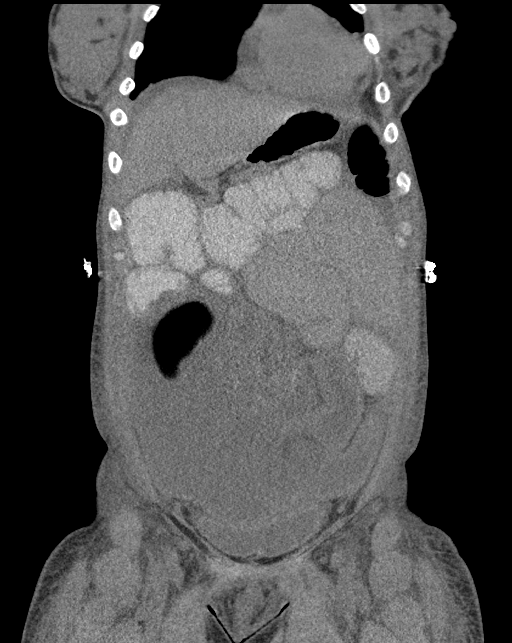
[im 67/151  soft-tissue]
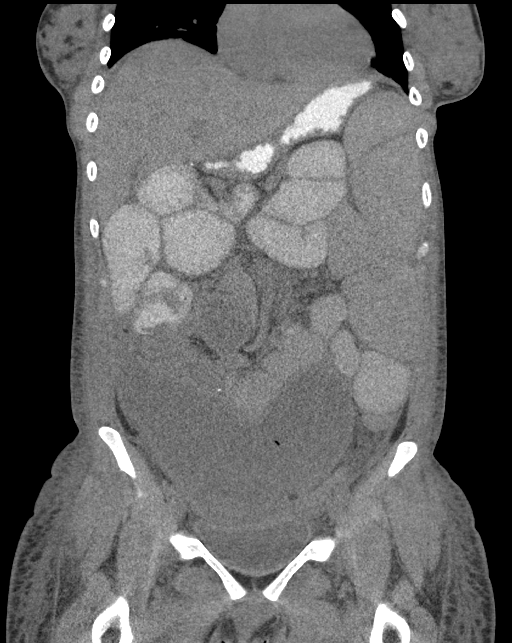
[im 84/151  soft-tissue]
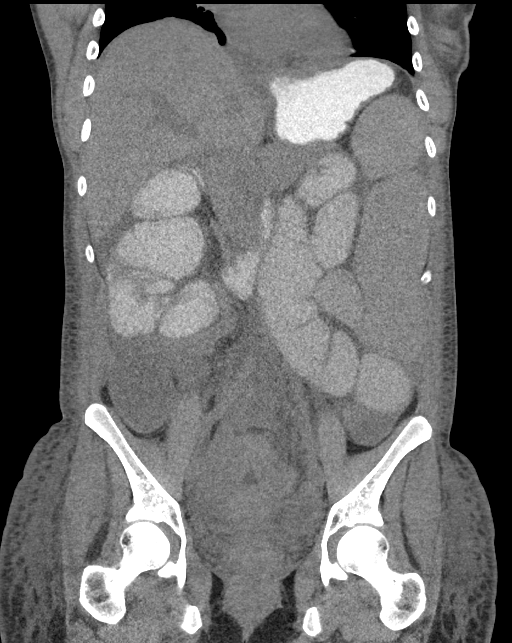

[16 of 46 positions shown; findings below may reference images not displayed]

FINDINGS: Lower chest: Small left pleural effusion is noted with adjacent
subsegmental atelectasis.

Hepatobiliary: No focal liver abnormality is seen. No gallstones,
gallbladder wall thickening, or biliary dilatation.

Pancreas: Unremarkable. No pancreatic ductal dilatation or
surrounding inflammatory changes.

Spleen: Normal in size without focal abnormality.

Adrenals/Urinary Tract: Adrenal glands are unremarkable. Kidneys are
normal, without renal calculi, focal lesion, or hydronephrosis.
Bladder is unremarkable.

Stomach/Bowel: Status post appendectomy. Stomach is unremarkable.
Severely dilated small bowel loops are noted which are more
prominent compared to prior exam. There is seen some contrast within
the dilated small bowel loops, but no colonic contrast is noted.
These findings are highly concerning for distal small bowel
obstruction. Distal small bowel loops are not well visualized due to
lack of intravenous contrast as well as no oral contrast in the
distal small bowel.

Vascular/Lymphatic: No significant vascular findings are present. No
enlarged abdominal or pelvic lymph nodes.

Reproductive: Uterus and bilateral adnexa are unremarkable.

Other: Mild anasarca is noted. Minimal ascites is noted. Small fat
containing periumbilical hernia is noted.

Musculoskeletal: No acute or significant osseous findings.
IMPRESSION: Significantly worsening small bowel dilatation is noted most
consistent with distal small bowel obstruction.

Mild anasarca is noted.  Minimal ascites is noted.

Small left pleural effusion is noted with adjacent subsegmental
atelectasis.

## 2020-10-18 IMAGING — DX DG CHEST 1V PORT
1 series · 1 of 1 positions shown · non-contrast
Comparison: April 20, 2017

CLINICAL DATA: Hypoxia

EXAM:
PORTABLE CHEST 1 VIEW

[chest ap]
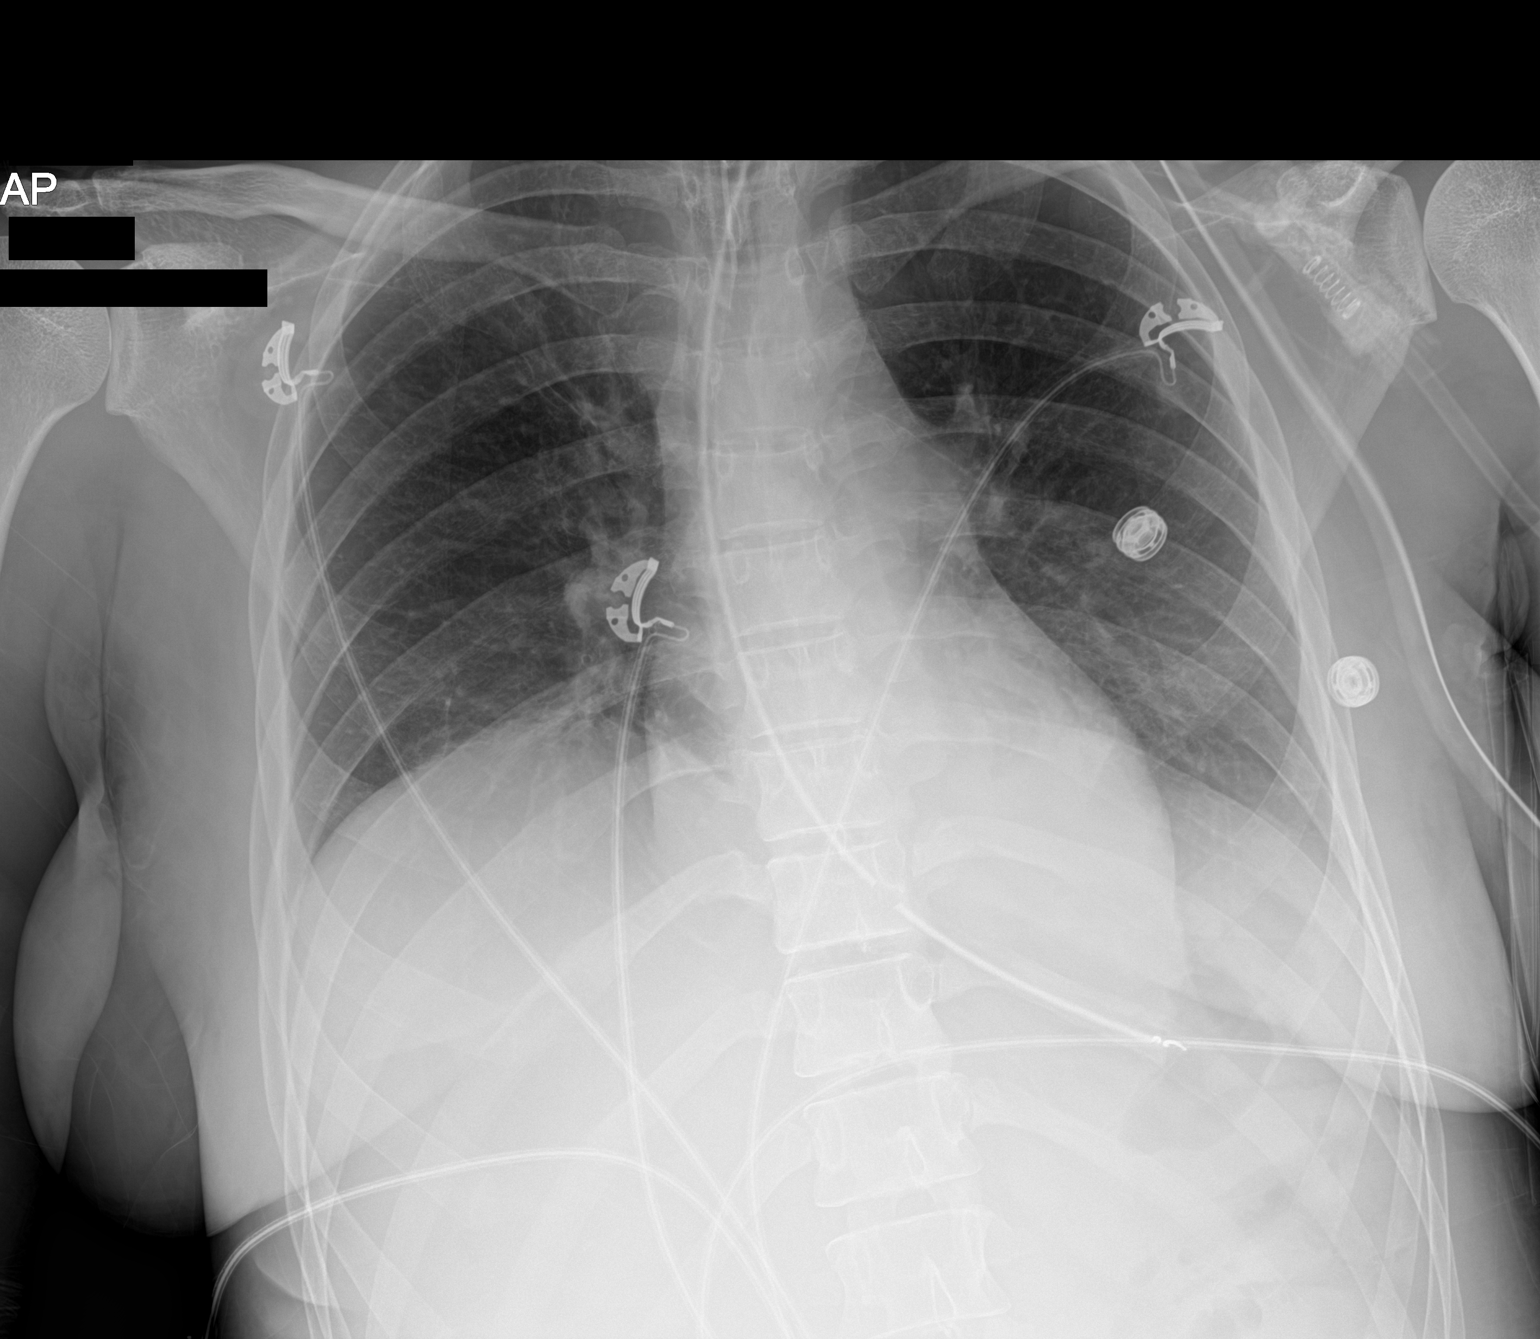

[1 of 1 positions shown; findings below may reference images not displayed]

FINDINGS: Endotracheal tube tip is 3.9 cm above the carina. Nasogastric tube
tip and side port are in the stomach. No pneumothorax. Lungs are
clear. Heart size and pulmonary vascularity are normal. No
adenopathy. No bone lesions.
IMPRESSION: Tube positions as described without pneumothorax. No edema or
consolidation. Cardiac silhouette within normal limits.

## 2020-10-25 IMAGING — CT CT IMAGE GUIDED DRAINAGE BY PERCUTANEOUS CATHETER
1 of 3 series · 14 of 32 positions shown, 18 images · non-contrast
Comparison: none

INDICATION: 28-year-old female with a history abscess
TECHNIQUE: Informed written consent was obtained from the patient after a
thorough discussion of the procedural risks, benefits and
alternatives. All questions were addressed. Maximal Sterile Barrier
Technique was utilized including caps, mask, sterile gowns, sterile
gloves, sterile drape, hand hygiene and skin antiseptic. A timeout
was performed prior to the initiation of the procedure.

[Series 2: i-spiral 5.0 b40f · axial · 0.90mm/px · z∈[+706,+905]mm · 14 of 63 slices shown, 18 images]
[im 3/63  soft-tissue]
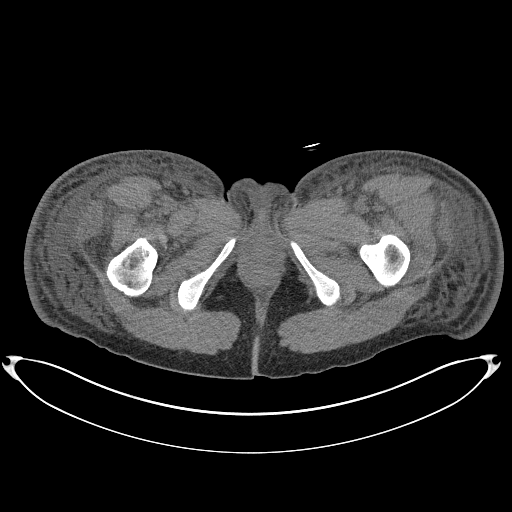
[im 3/63  bone]
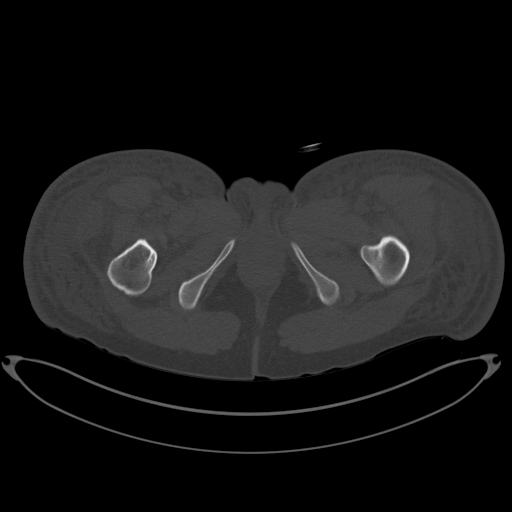
[im 8/63  soft-tissue]
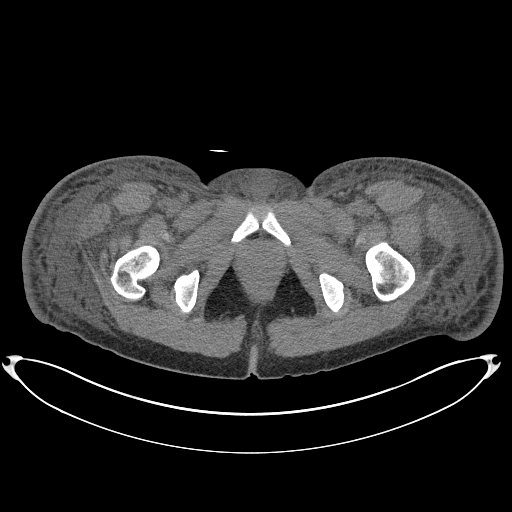
[im 13/63  soft-tissue]
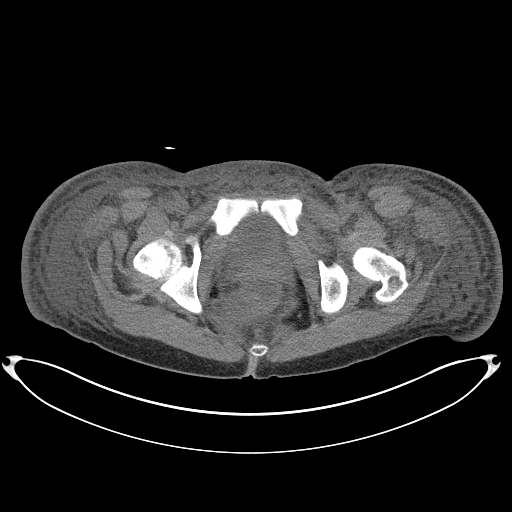
[im 19/63  soft-tissue]
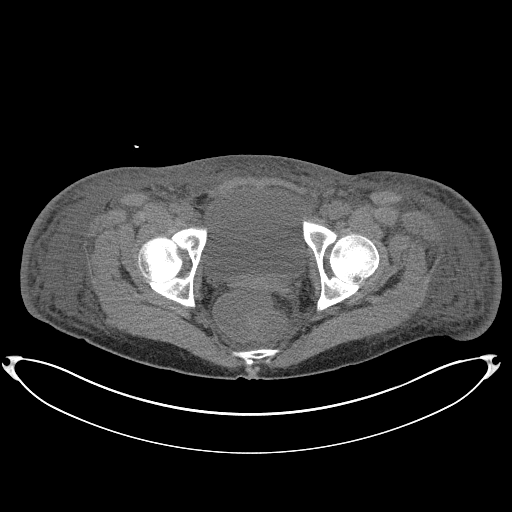
[im 24/63  soft-tissue]
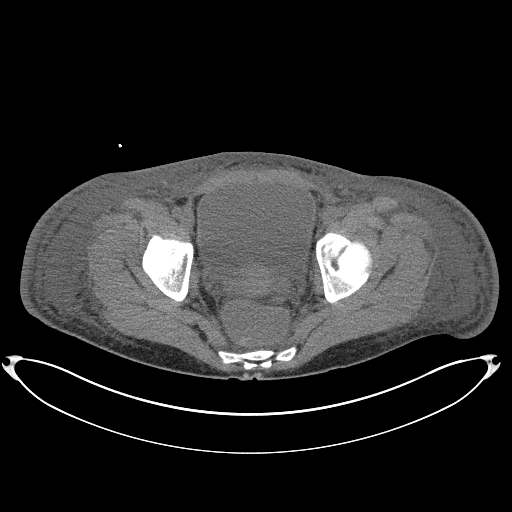
[im 29/63  soft-tissue]
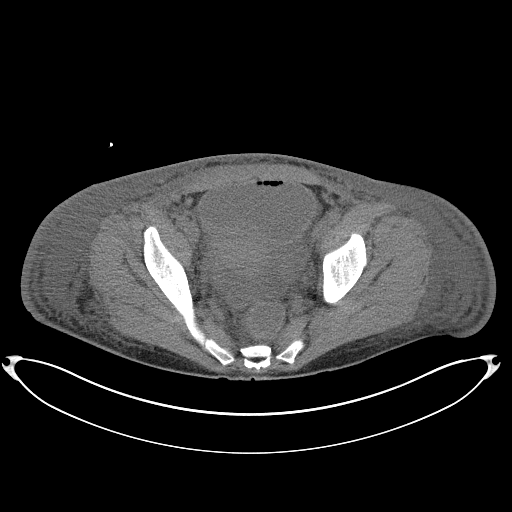
[im 34/63  soft-tissue]
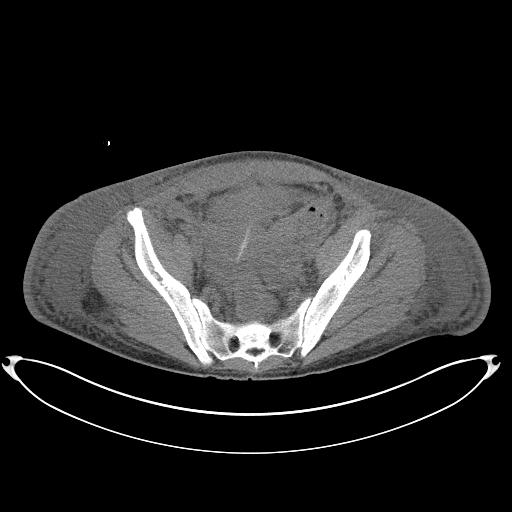
[im 39/63  soft-tissue]
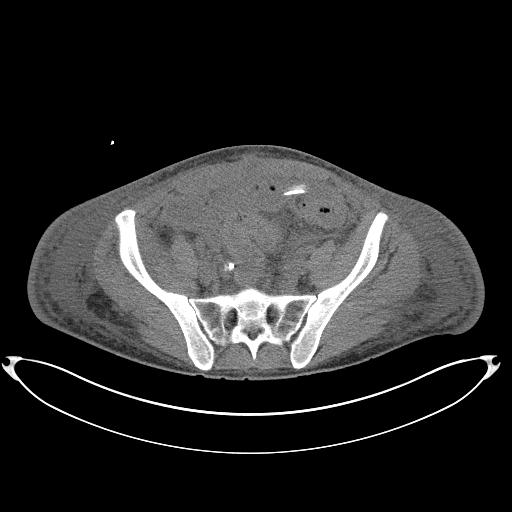
[im 44/63  soft-tissue]
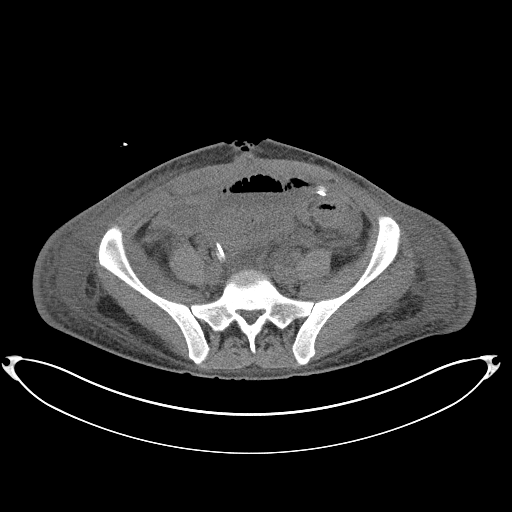
[im 44/63  bone]
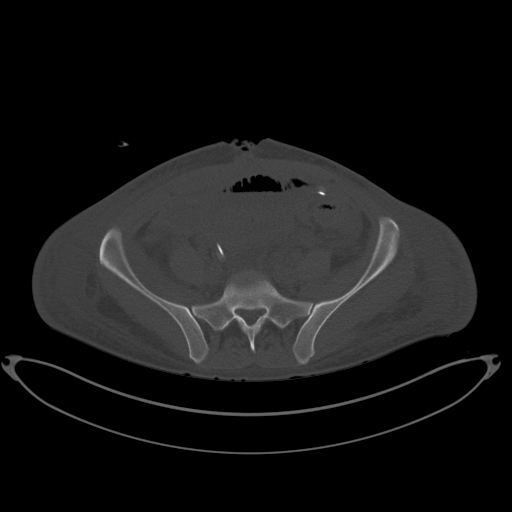
[im 50/63  soft-tissue]
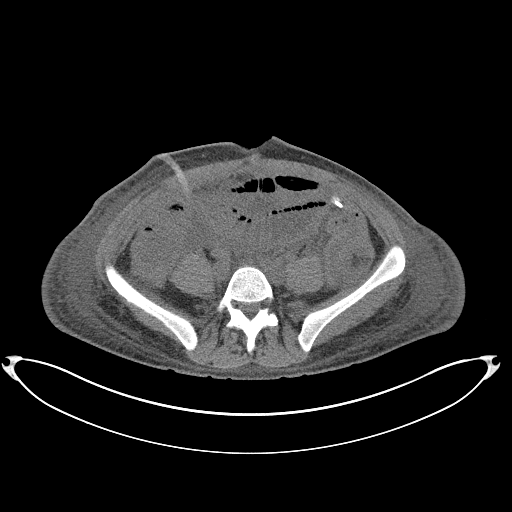
[im 52/63  lung]
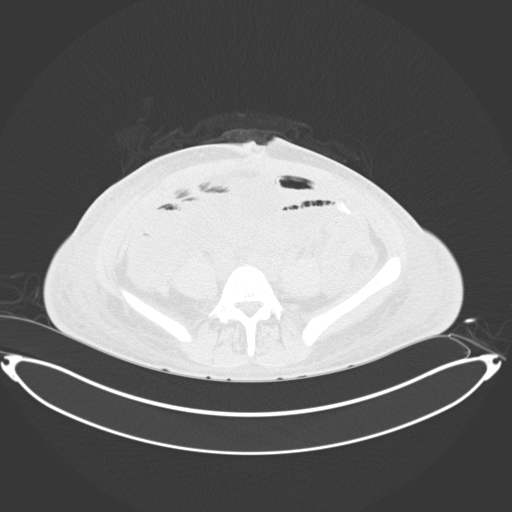
[im 55/63  soft-tissue]
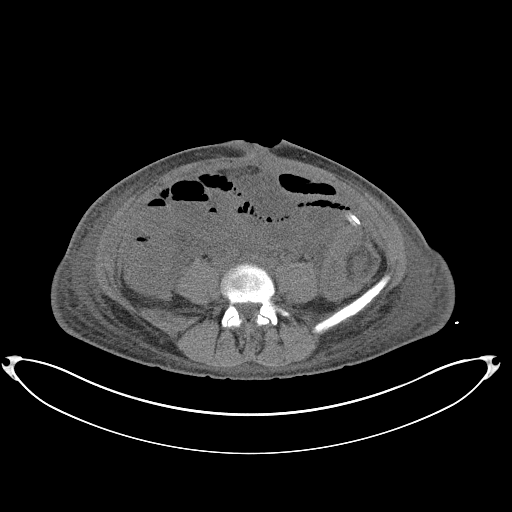
[im 55/63  lung]
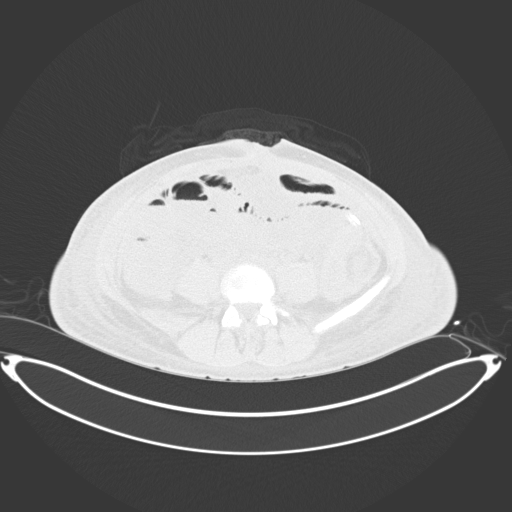
[im 57/63  lung]
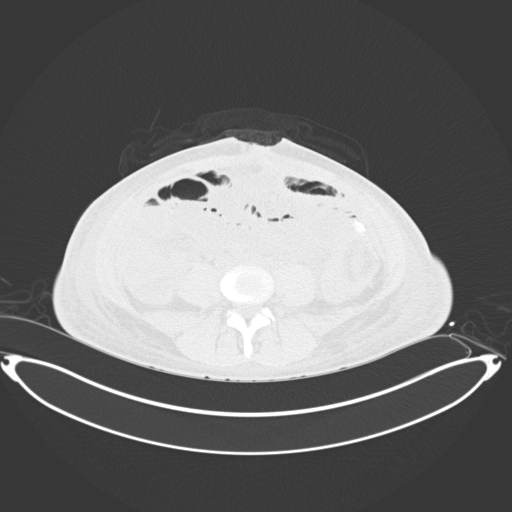
[im 60/63  soft-tissue]
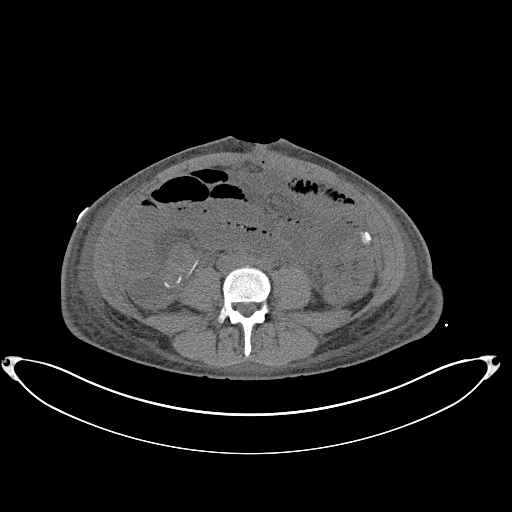
[im 60/63  lung]
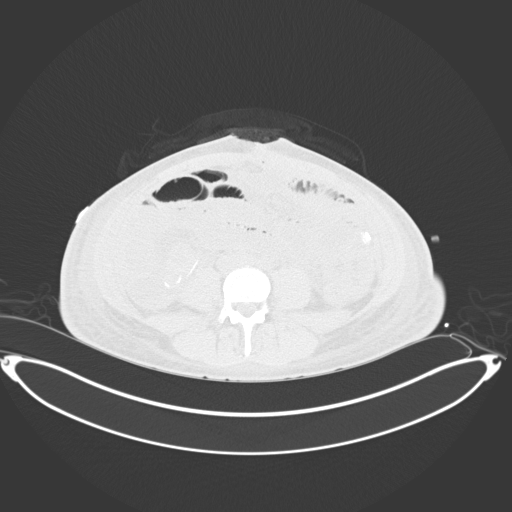

[14 of 32 positions shown; findings below may reference images not displayed]

EXAM:
CT GUIDED DRAINAGE OF  ABSCESS

MEDICATIONS:
The patient is currently admitted to the hospital and receiving
intravenous antibiotics. The antibiotics were administered within an
appropriate time frame prior to the initiation of the procedure.

ANESTHESIA/SEDATION:
2.0 mg IV Versed 100 mcg IV Fentanyl

Moderate Sedation Time:  21 minutes

The patient was continuously monitored during the procedure by the
interventional radiology nurse under my direct supervision.

COMPLICATIONS:
None
PROCEDURE:
The operative field was prepped with Chlorhexidine in a sterile
fashion, and a sterile drape was applied covering the operative
field. A sterile gown and sterile gloves were used for the
procedure. Local anesthesia was provided with 1% Lidocaine.

Patient positioned supine position on CT gantry table. Scout CT of
the abdomen performed for planning purposes.

The patient is prepped and draped in the usual sterile fashion. 1%
lidocaine was used for local anesthesia. Using modified Seldinger
technique, 10 French drain was placed and abscess collection of the
right lower quadrant.

Sample was aspirated first culture.

Catheter was attached to bulb drainage and sutured in position.

Patient tolerated the procedure well and remained hemodynamically
stable throughout.

No complications were encountered and no significant blood loss.
FINDINGS: CT demonstrates 10 French drain into right lower quadrant abscess.
Approximately 10 cc of fluid aspirated.
IMPRESSION: Status post CT-guided drainage of right lower quadrant collection.

## 2021-03-26 DIAGNOSIS — Z20822 Contact with and (suspected) exposure to covid-19: Secondary | ICD-10-CM | POA: Diagnosis not present

## 2021-03-29 DIAGNOSIS — Z20822 Contact with and (suspected) exposure to covid-19: Secondary | ICD-10-CM | POA: Diagnosis not present

## 2021-04-06 DIAGNOSIS — Z20822 Contact with and (suspected) exposure to covid-19: Secondary | ICD-10-CM | POA: Diagnosis not present

## 2021-06-05 NOTE — Progress Notes (Signed)
30 y.o. G0P0000 Single White or Caucasian Not Hispanic or Latino female here for annual exam.  She is having frequent urination at night, prior to falling asleep. Voiding small amounts. Just occasional nocturia. She is has some question about what soap is best to use.  Her cycles have ranged from q 23-32 days (currently on day #36). Sexually active using condoms. Same partner x 2 years. Lives together.  Period Cycle (Days): 35 Period Duration (Days): 4-5 Period Pattern: (!) Irregular Menstrual Flow: Moderate Menstrual Control: Other (Comment) (menstral cup) Menstrual Control Change Freq (Hours): 3-4 Dysmenorrhea: (!) Moderate Dysmenorrhea Symptoms: Cramping  H/o an eating disorder. Has gained weight.  H/O hypothalamic amenorrhea. Was previously on OCP's.   No medical changes.   [name] was 04/24/2021.          Sexually active: Yes.    The current method of family planning is condoms most of the time.    Exercising: Yes.    Gym/ health club routine includes cardio and light weights. Smoker:  no  Health Maintenance: Pap: 05/31/20 WNL 12/22/2017 WNL  History of abnormal Pap:  no MMG:  None  BMD:   None  Colonoscopy: none  TDaP:  04/20/17  Gardasil: complete    reports that she has never smoked. She has never used smokeless tobacco. She reports current alcohol use. She reports that she does not use drugs. Occasional ETOH. 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  Past Medical History:  Diagnosis Date   Amenorrhea    Anemia    Blood transfusion without reported diagnosis    Gluten intolerance    History of anemia    resolved with dietary changes   History of eating disorder    Hypothyroidism (acquired) 03/25/2018    Past Surgical History:  Procedure Laterality Date   ILEOCECETOMY  04/20/2019   Procedure: Ileocecetomy;  Surgeon: Andria Meuse, MD;  Location: [facility] OR;  Service: General;;   LAPAROTOMY N/A 04/20/2019    Procedure: EXPLORATORY LAPAROTOMY,  SMALL BOWEL RESECTION, partial omentectomy, drainage of abdominal abscess;  Surgeon: Andria Meuse, MD;  Location: [facility] OR;  Service: General;  Laterality: N/A;   LYSIS OF ADHESION  04/20/2019   Procedure: Lysis Of Adhesion;  Surgeon: Andria Meuse, MD;  Location: [facility] OR;  Service: General;;   MECKEL DIVERTICULUM EXCISION  at 2   started as laparoscopy, ended in laparotomy    No current outpatient medications on file.   No current facility-administered medications for this visit.    Family History  Problem Relation Age of Onset   Breast cancer Paternal Grandmother    Ovarian cancer Maternal Grandmother     Review of Systems  All other systems reviewed and are negative.  Exam:   BP 122/82   Pulse 68   Ht 5\' 5"  (1.651 m)   Wt 143 lb (64.9 kg)   LMP 04/24/2021   SpO2 99%   BMI 23.80 kg/m   Weight change: @WEIGHTCHANGE @ Height:   Height: 5\' 5"  (165.1 cm)  Ht Readings from Last 3 Encounters:  06/07/21 5\' 5"  (1.651 m)  05/31/20 5\' 5"  (1.651 m)  08/25/19 5\' 5"  (1.651 m)    General appearance: alert, cooperative and appears stated age Head: Normocephalic, without obvious abnormality, atraumatic Neck: no adenopathy, supple, symmetrical, trachea midline and thyroid normal to inspection and palpation Lungs: clear to auscultation bilaterally Cardiovascular: regular rate and rhythm Breasts: normal appearance, no masses or tenderness Abdomen: soft,  non-tender; non distended,  no masses,  no organomegaly Extremities: extremities normal, atraumatic, no cyanosis or edema Skin: Skin color, texture, turgor normal. No rashes or lesions Lymph nodes: Cervical, supraclavicular, and axillary nodes normal. No abnormal inguinal nodes palpated Neurologic: Grossly normal   Pelvic: External genitalia:  no lesions              Urethra:  normal appearing urethra with no masses, tenderness or lesions              Bartholins and Skenes: normal                  Vagina: normal appearing vagina with normal color and discharge, no lesions              Cervix: no lesions               Bimanual Exam:  Uterus:  normal size, contour, position, consistency, mobility, non-tender              Adnexa: no mass, fullness, tenderness               Rectovaginal: Confirms               Anus:  normal sphincter tone, no lesions  Carolynn Serve chaperoned for the exam.  1. Well woman exam Discussed breast self exam Discussed calcium and vit D intake Using condoms No pap this year  2. Laboratory exam ordered as part of routine general medical examination - CBC; Future - Comprehensive metabolic panel; Future - Lipid panel; Future  3. History of elevated lipids - Lipid panel; Future  4. Vitamin D deficiency - VITAMIN D 25 Hydroxy (Vit-D Deficiency, Fractures); Future  5. Elevated LFTs - Comprehensive metabolic panel; Future

## 2021-06-07 ENCOUNTER — Ambulatory Visit (INDEPENDENT_AMBULATORY_CARE_PROVIDER_SITE_OTHER): Payer: BC Managed Care – PPO | Admitting: Obstetrics and Gynecology

## 2021-06-07 ENCOUNTER — Other Ambulatory Visit: Payer: Self-pay

## 2021-06-07 ENCOUNTER — Encounter: Payer: Self-pay | Admitting: Obstetrics and Gynecology

## 2021-06-07 VITALS — BP 122/82 | HR 68 | Ht 65.0 in | Wt 143.0 lb

## 2021-06-07 DIAGNOSIS — E559 Vitamin D deficiency, unspecified: Secondary | ICD-10-CM

## 2021-06-07 DIAGNOSIS — Z0000 Encounter for general adult medical examination without abnormal findings: Secondary | ICD-10-CM

## 2021-06-07 DIAGNOSIS — Z01419 Encounter for gynecological examination (general) (routine) without abnormal findings: Secondary | ICD-10-CM

## 2021-06-07 DIAGNOSIS — R7989 Other specified abnormal findings of blood chemistry: Secondary | ICD-10-CM | POA: Diagnosis not present

## 2021-06-07 DIAGNOSIS — Z8639 Personal history of other endocrine, nutritional and metabolic disease: Secondary | ICD-10-CM

## 2021-06-07 NOTE — Patient Instructions (Signed)

## 2021-06-08 LAB — COMPREHENSIVE METABOLIC PANEL
AG Ratio: 1.8 (calc) (ref 1.0–2.5)
ALT: 19 U/L (ref 6–29)
AST: 18 U/L (ref 10–30)
Albumin: 4.6 g/dL (ref 3.6–5.1)
Alkaline phosphatase (APISO): 54 U/L (ref 31–125)
BUN: 13 mg/dL (ref 7–25)
CO2: 24 mmol/L (ref 20–32)
Calcium: 9.8 mg/dL (ref 8.6–10.2)
Chloride: 104 mmol/L (ref 98–110)
Creat: 0.97 mg/dL (ref 0.50–0.97)
Globulin: 2.5 g/dL (calc) (ref 1.9–3.7)
Glucose, Bld: 84 mg/dL (ref 65–99)
Potassium: 5 mmol/L (ref 3.5–5.3)
Sodium: 136 mmol/L (ref 135–146)
Total Bilirubin: 0.5 mg/dL (ref 0.2–1.2)
Total Protein: 7.1 g/dL (ref 6.1–8.1)

## 2021-06-08 LAB — CBC
HCT: 37.9 % (ref 35.0–45.0)
Hemoglobin: 12.6 g/dL (ref 11.7–15.5)
MCH: 30.6 pg (ref 27.0–33.0)
MCHC: 33.2 g/dL (ref 32.0–36.0)
MCV: 92 fL (ref 80.0–100.0)
MPV: 10.1 fL (ref 7.5–12.5)
Platelets: 430 10*3/uL — ABNORMAL HIGH (ref 140–400)
RBC: 4.12 10*6/uL (ref 3.80–5.10)
RDW: 12 % (ref 11.0–15.0)
WBC: 7.3 10*3/uL (ref 3.8–10.8)

## 2021-06-08 LAB — VITAMIN D 25 HYDROXY (VIT D DEFICIENCY, FRACTURES): Vit D, 25-Hydroxy: 21 ng/mL — ABNORMAL LOW (ref 30–100)

## 2021-06-08 LAB — LIPID PANEL
Cholesterol: 154 mg/dL (ref <200)
HDL: 58 mg/dL (ref >=50)
LDL Cholesterol (Calc): 78 mg/dL (calc)
Non-HDL Cholesterol (Calc): 96 mg/dL (calc) (ref <130)
Total CHOL/HDL Ratio: 2.7 (calc) (ref <5.0)
Triglycerides: 96 mg/dL (ref <150)

## 2021-06-29 DIAGNOSIS — F419 Anxiety disorder, unspecified: Secondary | ICD-10-CM | POA: Diagnosis not present

## 2021-07-26 DIAGNOSIS — F419 Anxiety disorder, unspecified: Secondary | ICD-10-CM | POA: Diagnosis not present

## 2021-12-10 DIAGNOSIS — D398 Neoplasm of uncertain behavior of other specified female genital organs: Secondary | ICD-10-CM | POA: Diagnosis not present

## 2021-12-10 DIAGNOSIS — D225 Melanocytic nevi of trunk: Secondary | ICD-10-CM | POA: Diagnosis not present

## 2022-02-15 DIAGNOSIS — F419 Anxiety disorder, unspecified: Secondary | ICD-10-CM | POA: Diagnosis not present

## 2022-03-29 DIAGNOSIS — F419 Anxiety disorder, unspecified: Secondary | ICD-10-CM | POA: Diagnosis not present

## 2022-04-01 DIAGNOSIS — F419 Anxiety disorder, unspecified: Secondary | ICD-10-CM | POA: Diagnosis not present

## 2022-05-08 DIAGNOSIS — F419 Anxiety disorder, unspecified: Secondary | ICD-10-CM | POA: Diagnosis not present

## 2022-05-10 DIAGNOSIS — F419 Anxiety disorder, unspecified: Secondary | ICD-10-CM | POA: Diagnosis not present

## 2022-05-24 DIAGNOSIS — E781 Pure hyperglyceridemia: Secondary | ICD-10-CM | POA: Diagnosis not present

## 2022-05-24 DIAGNOSIS — Z0000 Encounter for general adult medical examination without abnormal findings: Secondary | ICD-10-CM | POA: Diagnosis not present

## 2022-05-24 DIAGNOSIS — R030 Elevated blood-pressure reading, without diagnosis of hypertension: Secondary | ICD-10-CM | POA: Diagnosis not present

## 2022-05-24 DIAGNOSIS — R351 Nocturia: Secondary | ICD-10-CM | POA: Diagnosis not present

## 2022-05-24 DIAGNOSIS — E559 Vitamin D deficiency, unspecified: Secondary | ICD-10-CM | POA: Diagnosis not present

## 2022-06-03 NOTE — Progress Notes (Deleted)
31 y.o. G0P0000 Single White or Caucasian Not Hispanic or Latino female here for annual exam.      No LMP recorded. (Menstrual status: Irregular Periods).          Sexually active: {yes no:314532}  The current method of family planning is {contraception:315051}.    Exercising: {yes no:314532}  {types:19826} Smoker:  {YES [id number]  Health Maintenance: Pap:   05/31/20 WNL 12/22/2017 WNL   History of abnormal Pap:  no MMG:  none  BMD:   n/a Colonoscopy: n/a TDaP:  04/20/17 Gardasil: complete    reports that she has never smoked. She has never used smokeless tobacco. She reports current alcohol use. She reports that she does not use drugs.  Past Medical History:  Diagnosis Date   Amenorrhea    Anemia    Blood transfusion without reported diagnosis    Gluten intolerance    History of anemia    resolved with dietary changes   History of eating disorder    Hypothyroidism (acquired) 03/25/2018    Past Surgical History:  Procedure Laterality Date   ILEOCECETOMY  04/20/2019   Procedure: Ileocecetomy;  Surgeon: Andria Meuse, MD;  Location: [facility];  Service: General;;   LAPAROTOMY N/A 04/20/2019   Procedure: EXPLORATORY LAPAROTOMY,  SMALL BOWEL RESECTION, partial omentectomy, drainage of abdominal abscess;  Surgeon: Andria Meuse, MD;  Location: [facility];  Service: General;  Laterality: N/A;   LYSIS OF ADHESION  04/20/2019   Procedure: Lysis Of Adhesion;  Surgeon: Andria Meuse, MD;  Location: [facility] OR;  Service: General;;   MECKEL DIVERTICULUM EXCISION  at 65   started as laparoscopy, ended in laparotomy    No current outpatient medications on file.   No current facility-administered medications for this visit.    Family History  Problem Relation Age of Onset   Breast cancer Paternal Grandmother    Ovarian cancer Maternal Grandmother     Review of Systems  Exam:   There were no vitals taken for this visit.  Weight change: @WEIGHTCHANGE @ Height:      Ht  Readings from Last 3 Encounters:  06/07/21 5\' 5"  (1.651 m)  05/31/20 5\' 5"  (1.651 m)  08/25/19 5\' 5"  (1.651 m)    General appearance: alert, cooperative and appears stated age Head: Normocephalic, without obvious abnormality, atraumatic Neck: no adenopathy, supple, symmetrical, trachea midline and thyroid {CHL AMB PHY EX THYROID NORM DEFAULT:[phone]::"normal to inspection and palpation"} Lungs: clear to auscultation bilaterally Cardiovascular: regular rate and rhythm Breasts: {Exam; breast:13139::"normal appearance, no masses or tenderness"} Abdomen: soft, non-tender; non distended,  no masses,  no organomegaly Extremities: extremities normal, atraumatic, no cyanosis or edema Skin: Skin color, texture, turgor normal. No rashes or lesions Lymph nodes: Cervical, supraclavicular, and axillary nodes normal. No abnormal inguinal nodes palpated Neurologic: Grossly normal   Pelvic: External genitalia:  no lesions              Urethra:  normal appearing urethra with no masses, tenderness or lesions              Bartholins and Skenes: normal                 Vagina: normal appearing vagina with normal color and discharge, no lesions              Cervix: {CHL AMB PHY EX CERVIX NORM [phone]::"no lesions"}               Bimanual Exam:  Uterus:  {CHL AMB  PHY EX UTERUS NORM DEFAULT:[phone]::"normal size, contour, position, consistency, mobility, non-tender"}              Adnexa: {CHL AMB PHY EX ADNEXA NO MASS DEFAULT:[phone]::"no mass, fullness, tenderness"}               Rectovaginal: Confirms               Anus:  normal sphincter tone, no lesions  *** chaperoned for the exam.  A:  Well Woman with normal exam  P:

## 2022-06-13 ENCOUNTER — Ambulatory Visit: Payer: BC Managed Care – PPO | Admitting: Obstetrics and Gynecology

## 2022-07-05 ENCOUNTER — Other Ambulatory Visit: Payer: Self-pay

## 2022-07-05 ENCOUNTER — Encounter (HOSPITAL_COMMUNITY): Payer: Self-pay

## 2022-07-05 ENCOUNTER — Emergency Department (HOSPITAL_COMMUNITY)
Admission: EM | Admit: 2022-07-05 | Discharge: 2022-07-05 | Disposition: A | Discharge status: Home / Self Care | Payer: BC Managed Care – PPO | Attending: Emergency Medicine | Admitting: Emergency Medicine

## 2022-07-05 DIAGNOSIS — M25551 Pain in right hip: Secondary | ICD-10-CM | POA: Diagnosis not present

## 2022-07-05 DIAGNOSIS — M25511 Pain in right shoulder: Secondary | ICD-10-CM | POA: Diagnosis not present

## 2022-07-05 DIAGNOSIS — R42 Dizziness and giddiness: Secondary | ICD-10-CM | POA: Diagnosis not present

## 2022-07-05 DIAGNOSIS — S161XXA Strain of muscle, fascia and tendon at neck level, initial encounter: Secondary | ICD-10-CM | POA: Diagnosis not present

## 2022-07-05 DIAGNOSIS — Z91010 Allergy to peanuts: Secondary | ICD-10-CM | POA: Insufficient documentation

## 2022-07-05 DIAGNOSIS — M25552 Pain in left hip: Secondary | ICD-10-CM | POA: Diagnosis not present

## 2022-07-05 DIAGNOSIS — S199XXA Unspecified injury of neck, initial encounter: Secondary | ICD-10-CM | POA: Diagnosis not present

## 2022-07-05 DIAGNOSIS — Y92410 Unspecified street and highway as the place of occurrence of the external cause: Secondary | ICD-10-CM | POA: Diagnosis not present

## 2022-07-05 MED ORDER — METHOCARBAMOL 500 MG PO TABS: 500.0000 mg | ORAL_TABLET | Freq: Two times a day (BID) | ORAL | 0 refills | Status: DC
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
## 2022-08-19 NOTE — Progress Notes (Signed)
32 y.o. G0P0000 Single White or Caucasian Not Hispanic or Latino female here for annual exam. She would like to talk about fertility.  Lives with her partner. Period Cycle (Days): 34 Period Duration (Days): 4 Period Pattern: Regular Menstrual Flow: Moderate Menstrual Control: Maxi pad Menstrual Control Change Freq (Hours): 8 Dysmenorrhea: (!) Moderate Dysmenorrhea Symptoms: Cramping, Diarrhea, Headache  H/O SBO, s/p exp laparotomy with ileocecectomy, drainage of pelvic abscess. We discussed the risk of ectopic pregnancy in future pregnancy.   [name] was 08/26/2022.          Sexually active: Yes.    The current method of family planning is condoms most of the time.    Exercising: Yes.     Bike weights and figure skating  Smoker:  no  Health Maintenance: Pap:   05/31/20 WNL; 12/22/2017 WNL  History of abnormal Pap:  no MMG:  None  BMD:   None  Colonoscopy: none  TDaP:  04/20/17  Gardasil: complete    reports that she has never smoked. She has never used smokeless tobacco. She reports current alcohol use. She reports that she does not use drugs. 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   Past Medical History:  Diagnosis Date   Amenorrhea    Anemia    Blood transfusion without reported diagnosis    Gluten intolerance    History of anemia    resolved with dietary changes   History of eating disorder    Hypothyroidism (acquired) 03/25/2018    Past Surgical History:  Procedure Laterality Date   ILEOCECETOMY  04/20/2019   Procedure: Ileocecetomy;  Surgeon: Ileana Roup, MD;  Location: [facility];  Service: General;;   LAPAROTOMY N/A 04/20/2019   Procedure: EXPLORATORY LAPAROTOMY,  SMALL BOWEL RESECTION, partial omentectomy, drainage of abdominal abscess;  Surgeon: Ileana Roup, MD;  Location: [facility];  Service: General;  Laterality: N/A;   LYSIS OF  ADHESION  04/20/2019   Procedure: Lysis Of Adhesion;  Surgeon: Ileana Roup, MD;  Location: [facility];  Service: General;;   MECKEL DIVERTICULUM EXCISION  at 7   started as laparoscopy, ended in laparotomy    Current Outpatient Medications  Medication Sig Dispense Refill   traZODone (DESYREL) 50 MG tablet Take 50 mg by mouth at bedtime as needed.     No current facility-administered medications for this visit.    Family History  Problem Relation Age of Onset   Breast cancer Paternal Grandmother    Ovarian cancer Maternal Grandmother     Review of Systems  All other systems reviewed and are negative.   Exam:   BP 110/62   Pulse 74   Ht '5\' 5"'$  (1.651 m)   Wt 140 lb (63.5 kg)   LMP 08/26/2022   SpO2 100%   BMI 23.30 kg/m   Weight change: '@WEIGHTCHANGE'$ @ Height:   Height: '5\' 5"'$  (165.1 cm)  Ht Readings from Last 3 Encounters:  08/28/22 '5\' 5"'$  (1.651 m)  07/05/22 '5\' 6"'$  (1.676 m)  06/07/21 '5\' 5"'$  (1.651 m)    General appearance: alert, cooperative and appears stated age Head: Normocephalic, without obvious abnormality, atraumatic Neck: no adenopathy, supple, symmetrical, trachea midline and thyroid normal to inspection and palpation Lungs: clear to auscultation bilaterally Cardiovascular: regular rate and rhythm Breasts: normal appearance, no masses or tenderness Abdomen: soft, non-tender; non distended,  no masses,  no organomegaly Extremities:  extremities normal, atraumatic, no cyanosis or edema Skin: Skin color, texture, turgor normal. No rashes or lesions Lymph nodes: Cervical, supraclavicular, and axillary nodes normal. No abnormal inguinal nodes palpated Neurologic: Grossly normal   Pelvic: External genitalia:  no lesions              Urethra:  normal appearing urethra with no masses, tenderness or lesions              Bartholins and Skenes: normal                 Vagina: normal appearing vagina with normal color, moderate blood, no lesions              Cervix:  no lesions               Bimanual Exam:  Uterus:  normal size, contour, position, consistency, mobility, non-tender              Adnexa: no mass, fullness, tenderness               Rectovaginal: Confirms               Anus:  normal sphincter tone, no lesions  Gae Dry, CMA chaperoned for the exam.  1. Well woman exam Discussed breast self exam Discussed calcium and vit D intake Labs with primary  2. Screening for cervical cancer - Cytology - PAP  3. Vitamin D deficiency Recently checked with her primary and started on supplements

## 2022-08-28 ENCOUNTER — Other Ambulatory Visit (HOSPITAL_COMMUNITY)
Admission: RE | Admit: 2022-08-28 | Discharge: 2022-08-28 | Disposition: A | Discharge status: Home / Self Care | Payer: 59 | Source: Ambulatory Visit | Attending: Obstetrics and Gynecology | Admitting: Obstetrics and Gynecology

## 2022-08-28 ENCOUNTER — Ambulatory Visit (INDEPENDENT_AMBULATORY_CARE_PROVIDER_SITE_OTHER): Payer: 59 | Admitting: Obstetrics and Gynecology

## 2022-08-28 ENCOUNTER — Encounter: Payer: Self-pay | Admitting: Obstetrics and Gynecology

## 2022-08-28 VITALS — BP 110/62 | HR 74 | Ht 65.0 in | Wt 140.0 lb

## 2022-08-28 DIAGNOSIS — Z124 Encounter for screening for malignant neoplasm of cervix: Secondary | ICD-10-CM

## 2022-08-28 DIAGNOSIS — Z01419 Encounter for gynecological examination (general) (routine) without abnormal findings: Secondary | ICD-10-CM

## 2022-08-28 DIAGNOSIS — E559 Vitamin D deficiency, unspecified: Secondary | ICD-10-CM

## 2022-08-28 DIAGNOSIS — G4700 Insomnia, unspecified: Secondary | ICD-10-CM | POA: Insufficient documentation

## 2022-08-28 DIAGNOSIS — E781 Pure hyperglyceridemia: Secondary | ICD-10-CM | POA: Insufficient documentation

## 2022-08-28 DIAGNOSIS — R351 Nocturia: Secondary | ICD-10-CM | POA: Insufficient documentation

## 2022-08-28 DIAGNOSIS — Z8719 Personal history of other diseases of the digestive system: Secondary | ICD-10-CM | POA: Insufficient documentation

## 2022-08-28 NOTE — Patient Instructions (Signed)

## 2022-08-29 LAB — CYTOLOGY - PAP
Comment: NEGATIVE
Diagnosis: NEGATIVE
High risk HPV: NEGATIVE
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
## 2022-10-03 ENCOUNTER — Encounter (INDEPENDENT_AMBULATORY_CARE_PROVIDER_SITE_OTHER): Payer: Self-pay | Admitting: Ophthalmology

## 2022-10-03 ENCOUNTER — Ambulatory Visit (INDEPENDENT_AMBULATORY_CARE_PROVIDER_SITE_OTHER): Payer: 59 | Admitting: Ophthalmology

## 2022-10-03 DIAGNOSIS — H43393 Other vitreous opacities, bilateral: Secondary | ICD-10-CM | POA: Diagnosis not present

## 2022-10-03 DIAGNOSIS — H3581 Retinal edema: Secondary | ICD-10-CM

## 2022-10-03 DIAGNOSIS — Z9889 Other specified postprocedural states: Secondary | ICD-10-CM | POA: Diagnosis not present

## 2022-10-03 DIAGNOSIS — H31093 Other chorioretinal scars, bilateral: Secondary | ICD-10-CM

## 2022-10-11 ENCOUNTER — Encounter (INDEPENDENT_AMBULATORY_CARE_PROVIDER_SITE_OTHER): Payer: 59 | Admitting: Ophthalmology

## 2022-12-16 DIAGNOSIS — F419 Anxiety disorder, unspecified: Secondary | ICD-10-CM | POA: Diagnosis not present

## 2022-12-23 ENCOUNTER — Encounter (INDEPENDENT_AMBULATORY_CARE_PROVIDER_SITE_OTHER): Payer: 59 | Admitting: Ophthalmology

## 2022-12-24 ENCOUNTER — Encounter (INDEPENDENT_AMBULATORY_CARE_PROVIDER_SITE_OTHER): Payer: 59 | Admitting: Ophthalmology

## 2023-03-31 DIAGNOSIS — H5201 Hypermetropia, right eye: Secondary | ICD-10-CM | POA: Diagnosis not present

## 2023-03-31 DIAGNOSIS — H5212 Myopia, left eye: Secondary | ICD-10-CM | POA: Diagnosis not present

## 2023-03-31 DIAGNOSIS — H52223 Regular astigmatism, bilateral: Secondary | ICD-10-CM | POA: Diagnosis not present

## 2023-04-09 DIAGNOSIS — H5213 Myopia, bilateral: Secondary | ICD-10-CM | POA: Diagnosis not present

## 2023-04-09 DIAGNOSIS — H5210 Myopia, unspecified eye: Secondary | ICD-10-CM | POA: Diagnosis not present

## 2023-04-28 DIAGNOSIS — F439 Reaction to severe stress, unspecified: Secondary | ICD-10-CM | POA: Diagnosis not present

## 2023-04-28 DIAGNOSIS — F419 Anxiety disorder, unspecified: Secondary | ICD-10-CM | POA: Diagnosis not present

## 2023-05-12 DIAGNOSIS — R41840 Attention and concentration deficit: Secondary | ICD-10-CM | POA: Diagnosis not present

## 2023-05-12 DIAGNOSIS — Z79899 Other long term (current) drug therapy: Secondary | ICD-10-CM | POA: Diagnosis not present

## 2023-05-13 DIAGNOSIS — R41840 Attention and concentration deficit: Secondary | ICD-10-CM | POA: Diagnosis not present

## 2023-05-13 DIAGNOSIS — Z79899 Other long term (current) drug therapy: Secondary | ICD-10-CM | POA: Diagnosis not present

## 2023-05-19 DIAGNOSIS — Z79899 Other long term (current) drug therapy: Secondary | ICD-10-CM | POA: Diagnosis not present

## 2023-05-19 DIAGNOSIS — R41840 Attention and concentration deficit: Secondary | ICD-10-CM | POA: Diagnosis not present

## 2023-05-30 DIAGNOSIS — E559 Vitamin D deficiency, unspecified: Secondary | ICD-10-CM | POA: Diagnosis not present

## 2023-05-30 DIAGNOSIS — Z0000 Encounter for general adult medical examination without abnormal findings: Secondary | ICD-10-CM | POA: Diagnosis not present

## 2023-05-30 DIAGNOSIS — E781 Pure hyperglyceridemia: Secondary | ICD-10-CM | POA: Diagnosis not present

## 2023-05-30 DIAGNOSIS — R030 Elevated blood-pressure reading, without diagnosis of hypertension: Secondary | ICD-10-CM | POA: Diagnosis not present

## 2023-06-10 ENCOUNTER — Ambulatory Visit: Payer: BC Managed Care – PPO | Admitting: Obstetrics and Gynecology

## 2023-08-18 ENCOUNTER — Other Ambulatory Visit (HOSPITAL_COMMUNITY): Payer: Self-pay

## 2023-08-18 MED ORDER — LISDEXAMFETAMINE DIMESYLATE 20 MG PO CHEW
1.0000 | CHEWABLE_TABLET | Freq: Every morning | ORAL | 0 refills | Status: DC
Start: 2023-08-18 — End: 2023-09-12
  Filled 2023-08-18: qty 30, 30d supply, fill #0

## 2023-09-12 ENCOUNTER — Other Ambulatory Visit (HOSPITAL_COMMUNITY): Payer: Self-pay

## 2023-09-12 MED ORDER — LISDEXAMFETAMINE DIMESYLATE 20 MG PO CHEW
1.0000 | CHEWABLE_TABLET | Freq: Every morning | ORAL | 0 refills | Status: DC
  Filled 2023-09-12 – 2023-09-15 (×2): qty 30, 30d supply, fill #0

## 2023-09-15 ENCOUNTER — Other Ambulatory Visit (HOSPITAL_COMMUNITY): Payer: Self-pay

## 2023-10-16 ENCOUNTER — Other Ambulatory Visit (HOSPITAL_COMMUNITY): Payer: Self-pay

## 2023-10-16 MED ORDER — LISDEXAMFETAMINE DIMESYLATE 20 MG PO CHEW
1.0000 | CHEWABLE_TABLET | Freq: Every morning | ORAL | 0 refills | Status: AC
  Filled 2023-10-16: qty 30, 30d supply, fill #0

## 2023-10-17 ENCOUNTER — Other Ambulatory Visit (HOSPITAL_COMMUNITY): Payer: Self-pay

## 2023-11-14 ENCOUNTER — Other Ambulatory Visit (HOSPITAL_COMMUNITY): Payer: Self-pay

## 2023-11-14 MED ORDER — LISDEXAMFETAMINE DIMESYLATE 40 MG PO CHEW
40.0000 mg | CHEWABLE_TABLET | Freq: Every morning | ORAL | 0 refills | Status: DC
Start: 2023-11-14 — End: 2023-12-29
  Filled 2023-11-14: qty 30, 30d supply, fill #0

## 2023-12-29 ENCOUNTER — Other Ambulatory Visit (HOSPITAL_COMMUNITY): Payer: Self-pay

## 2023-12-29 MED ORDER — LISDEXAMFETAMINE DIMESYLATE 40 MG PO CHEW
1.0000 | CHEWABLE_TABLET | Freq: Every morning | ORAL | 0 refills | Status: DC
  Filled 2023-12-29: qty 30, 30d supply, fill #0

## 2023-12-30 ENCOUNTER — Other Ambulatory Visit: Payer: Self-pay

## 2024-02-02 ENCOUNTER — Other Ambulatory Visit (HOSPITAL_COMMUNITY): Payer: Self-pay

## 2024-02-02 MED ORDER — LISDEXAMFETAMINE DIMESYLATE 40 MG PO CHEW
40.0000 mg | CHEWABLE_TABLET | Freq: Every morning | ORAL | 0 refills | Status: DC
  Filled 2024-02-02: qty 30, 30d supply, fill #0

## 2024-03-05 ENCOUNTER — Other Ambulatory Visit (HOSPITAL_COMMUNITY): Payer: Self-pay

## 2024-03-05 MED ORDER — LISDEXAMFETAMINE DIMESYLATE 40 MG PO CHEW
40.0000 mg | CHEWABLE_TABLET | Freq: Every morning | ORAL | 0 refills | Status: DC
  Filled 2024-03-05: qty 30, 30d supply, fill #0

## 2024-04-09 ENCOUNTER — Other Ambulatory Visit (HOSPITAL_COMMUNITY): Payer: Self-pay

## 2024-04-09 MED ORDER — LISDEXAMFETAMINE DIMESYLATE 40 MG PO CHEW
1.0000 | CHEWABLE_TABLET | Freq: Every morning | ORAL | 0 refills | Status: DC
  Filled 2024-04-09: qty 30, 30d supply, fill #0

## 2024-04-13 ENCOUNTER — Other Ambulatory Visit: Payer: Self-pay

## 2024-04-13 ENCOUNTER — Ambulatory Visit: Admitting: Internal Medicine

## 2024-04-13 ENCOUNTER — Encounter: Payer: Self-pay | Admitting: Internal Medicine

## 2024-04-13 VITALS — BP 108/78 | HR 89 | Temp 98.5°F | Ht 65.0 in | Wt 139.7 lb

## 2024-04-13 DIAGNOSIS — J3089 Other allergic rhinitis: Secondary | ICD-10-CM | POA: Diagnosis not present

## 2024-04-13 DIAGNOSIS — J393 Upper respiratory tract hypersensitivity reaction, site unspecified: Secondary | ICD-10-CM

## 2024-04-13 NOTE — Patient Instructions (Addendum)
 Food Reactions - Will test for commonly allergenic foods and alpha gal.  - Consider low FODMAP diet.   Other Allergic Rhinitis: - Use nasal saline rinses before nose sprays such as with Neilmed Sinus Rinse.  Use distilled water .   - Use Flonase 1-2 sprays each nostril daily. Aim upward and outward. - Use Zyrtec 10 mg daily as needed for runny nose, sneezing, itchy watery eyes.    Hold all anti-histamines (Xyzal, Allegra, Zyrtec, Claritin, Benadryl , Pepcid) 3 days prior to next visit.  Follow up: 10/31 at 830 skin testing 1-68

## 2024-04-13 NOTE — Progress Notes (Signed)
 NEW PATIENT  Date of Service/Encounter:  04/13/24  Consult requested by: Cleotilde, Virginia  E, PA   Subjective:   Kim Myers (DOB: October 19, 1990) is a 33 y.o. female who presents to the clinic on 04/13/2024 with a chief complaint of Allergy Testing (Requesting due to GI issues.) .    History obtained from: chart review and patient.   Food Reactions:  Ongoing for over 8+ years.  Seen GI doctor with negative celiac testing and was told to eliminate gluten for intolerance and is doing better with it.  But still sometimes eats foods and gets stomach pain.  Also was having a lot of congestion, drainage, runny nose with gluten in the past and also just with eating sometimes.  Used to work in [employer] and it made her symptoms worse at the time.  No hx of tick bites.  Eats a little red meats.  Has not been tested for alpha gal.  No hx of food allergies.  Rhinitis:  Started many years ago.  Symptoms include: nasal congestion and rhinorrhea  Occurs seasonally-Fall/Winter  Potential triggers: dust   Treatments tried:  Flonase PRN Claritin/Zyrtec PRN  Previous allergy testing: no History of sinus surgery: no Nonallergic triggers: none    Reviewed:  03/04/2024: referred for food intolerances by Virginia  Miller.  06/28/2019: seen by Endo for hypothyroidism, hypogonadotropic hypogonadism and elevated urinary cortisol. Monitoring labs.   04/14/2019-04/30/2019: hospitalized for SBO, abscess intraabdominal requiring IV abx and TPN.   Past Medical History: Past Medical History:  Diagnosis Date   Amenorrhea    Anemia    Blood transfusion without reported diagnosis    Gluten intolerance    History of anemia    resolved with dietary changes   History of eating disorder    Hypothyroidism (acquired) 03/25/2018   Past Surgical History: Past Surgical History:  Procedure Laterality Date   ILEOCECETOMY  04/20/2019   Procedure: Ileocecetomy;  Surgeon: Teresa Lonni HERO, MD;  Location: [facility];   Service: General;;   LAPAROTOMY N/A 04/20/2019   Procedure: EXPLORATORY LAPAROTOMY,  SMALL BOWEL RESECTION, partial omentectomy, drainage of abdominal abscess;  Surgeon: Teresa Lonni HERO, MD;  [facility];  Service: General;  Laterality: N/A;   LYSIS OF ADHESION  04/20/2019   Procedure: Lysis Of Adhesion;  Surgeon: Teresa Lonni HERO, MD;  [facility];  Service: General;;   MECKEL DIVERTICULUM EXCISION  at 78   started as laparoscopy, ended in laparotomy    Family History: Family History  Problem Relation Age of Onset   Healthy Mother    Healthy Father    Ovarian cancer Maternal Grandmother    Breast cancer Paternal Grandmother     Social History:  Flooring in bedroom: wood Pets: dog Tobacco use/exposure: none Job: in phd program  Medication List:  Allergies as of 04/13/2024       Reactions   Gluten Meal Diarrhea, Nausea And Vomiting, Other (See Comments)   Caused internal bleeding that resulted in hospitalization!!        Medication List        Accurate as of April 13, 2024  9:26 AM. If you have any questions, ask your nurse or doctor.          Lisdexamfetamine Dimesylate  20 MG Chew Chew 1 tablet (20 mg total) by mouth every morning.   Lisdexamfetamine Dimesylate  40 MG Chew Chew 1 tablet by mouth daily in the morning   traZODone 50 MG tablet Commonly known as: DESYREL Take 50 mg by  mouth at bedtime as needed.         REVIEW OF SYSTEMS: Pertinent positives and negatives discussed in HPI.   Objective:   Physical Exam: BP 108/78 (BP Location: Left Arm, Patient Position: Sitting, Cuff Size: Normal)   Pulse 89   Temp 98.5 F (36.9 C) (Temporal)   Ht 5' 5 (1.651 m)   Wt 139 lb 11.2 oz (63.4 kg)   SpO2 98%   BMI 23.25 kg/m  Body mass index is 23.25 kg/m. GEN: alert, well developed HEENT: clear conjunctiva, nose with + mild inferior turbinate hypertrophy, pink nasal mucosa, slight clear rhinorrhea, no cobblestoning HEART: regular  rate and rhythm, no murmur LUNGS: clear to auscultation bilaterally, no coughing, unlabored respiration ABDOMEN: soft, non distended  SKIN: no rashes or lesions   Assessment:   1. Other allergic rhinitis   2. Upper respiratory tract hypersensitivity reaction     Plan/Recommendations:  Food Reactions - Initial rxn: stomach pain and also congestion, drainage with foods  - Will test for commonly allergenic foods and alpha gal.   Other Allergic Rhinitis: - Due to turbinate hypertrophy, seasonal symptoms and unresponsive to over the counter meds, will perform skin testing to identify aeroallergen triggers.   - Use nasal saline rinses before nose sprays such as with Neilmed Sinus Rinse.  Use distilled water .   - Use Flonase 1-2 sprays each nostril daily. Aim upward and outward. - Use Zyrtec 10 mg daily as needed for runny nose, sneezing, itchy watery eyes.    Hold all anti-histamines (Xyzal, Allegra, Zyrtec, Claritin, Benadryl , Pepcid) 3 days prior to next visit.  Follow up: 10/31 at 830 skin testing 1-68, IDs okay    Arleta Blanch, MD [facility] 

## 2024-04-16 LAB — ALPHA-GAL PANEL
Allergen Lamb IgE: <0.1 kU/L
Beef IgE: <0.1 kU/L
IgE (Immunoglobulin E), Serum: 57 [IU]/mL (ref 6–495)
O215-IgE Alpha-Gal: <0.1 kU/L
Pork IgE: <0.1 kU/L

## 2024-04-23 ENCOUNTER — Other Ambulatory Visit (HOSPITAL_COMMUNITY): Payer: Self-pay

## 2024-04-23 ENCOUNTER — Ambulatory Visit (INDEPENDENT_AMBULATORY_CARE_PROVIDER_SITE_OTHER): Admitting: Internal Medicine

## 2024-04-23 ENCOUNTER — Ambulatory Visit: Payer: Self-pay | Admitting: Internal Medicine

## 2024-04-23 DIAGNOSIS — J301 Allergic rhinitis due to pollen: Secondary | ICD-10-CM | POA: Diagnosis not present

## 2024-04-23 DIAGNOSIS — J3081 Allergic rhinitis due to animal (cat) (dog) hair and dander: Secondary | ICD-10-CM | POA: Diagnosis not present

## 2024-04-23 DIAGNOSIS — J393 Upper respiratory tract hypersensitivity reaction, site unspecified: Secondary | ICD-10-CM

## 2024-04-23 DIAGNOSIS — J3089 Other allergic rhinitis: Secondary | ICD-10-CM

## 2024-04-23 MED ORDER — AZELASTINE HCL 0.1 % NA SOLN
2.0000 | Freq: Two times a day (BID) | NASAL | 5 refills | Status: AC | PRN
  Filled 2024-04-23: qty 30, 50d supply, fill #0

## 2024-04-23 NOTE — Progress Notes (Signed)
 FOLLOW UP Date of Service/Encounter:  04/23/24   Subjective:  Kim Myers (DOB: February 18, 1991) is a 33 y.o. female who [facility] for follow up for skin testing.   History obtained from: chart review and patient.  Anti histamines held.   Past Medical History: Past Medical History:  Diagnosis Date   Amenorrhea    Anemia    Blood transfusion without reported diagnosis    Gluten intolerance    History of anemia    resolved with dietary changes   History of eating disorder    Hypothyroidism (acquired) 03/25/2018    Objective:  There were no vitals taken for this visit. There is no height or weight on file to calculate BMI. Physical Exam: GEN: alert, well developed HEENT: clear conjunctiva, MMM LUNGS: unlabored respiration   Skin Testing:  Skin prick testing was placed, which includes aeroallergens/foods, histamine control, and saline control.  Verbal consent was obtained prior to placing test.  Patient tolerated procedure well.  Allergy testing results were read and interpreted by myself, documented by clinical staff. Adequate positive and negative control.  Positive results to:  Results discussed with patient/family.  Airborne Adult Perc - 04/23/24 0842     Time Antigen Placed 9157    Allergen Manufacturer Jestine    Location Back    Number of Test 55    1. Control-Buffer 50% Glycerol Negative    2. Control-Histamine 3+    3. Bahia Negative    4. Bermuda Negative    5. Johnson Negative    6. [place]  Blue Negative    7. Meadow Fescue Negative    8. Perennial Rye Negative    9. Timothy Negative    10. Ragweed Mix Negative    11. Cocklebur Negative    12. Plantain,  English Negative    13. Baccharis Negative    14. Dog Fennel Negative    15. Russian Thistle 2+    16. Lamb's Quarters 2+    17. Sheep Sorrell Negative    18. Rough Pigweed 3+    19. Marsh Elder, Rough 3+    20. Mugwort, Common Negative    21. Box, Elder  Negative    22. Cedar, red Negative    23. Sweet Gum Negative    24. Pecan Pollen Negative    25. Pine Mix Negative    26. Walnut, Black Pollen Negative    27. Red Mulberry Negative    28. Ash Mix Negative    29. Birch Mix Negative    30. Beech American Negative    31. Cottonwood, Eastern Negative    32. Hickory, White Negative    33. Maple Mix Negative    34. Oak, Eastern Mix Negative    35. Sycamore Eastern Negative    36. Alternaria Alternata Negative    37. Cladosporium Herbarum Negative    38. Aspergillus Mix Negative    39. Penicillium Mix Negative    40. Bipolaris Sorokiniana (Helminthosporium) Negative    41. Drechslera Spicifera (Curvularia) Negative    42. Mucor Plumbeus Negative    43. Fusarium Moniliforme Negative    44. Aureobasidium Pullulans (pullulara) Negative    45. Rhizopus Oryzae Negative    46. Botrytis Cinera Negative    47. Epicoccum Nigrum Negative    48. Phoma Betae Negative    49. Dust Mite Mix 3+    50. Cat Hair 10,000 BAU/ml 3+    51.  Dog Epithelia Negative  52. Mixed Feathers Negative    53. Horse Epithelia Negative    54. Cockroach, German Negative    55. Tobacco Leaf Negative          13 Food Perc - 04/23/24 0843       Test Information   Time Antigen Placed 9156    Allergen Manufacturer Jestine    Location Back    Number of allergen test 13      Food   1. Peanut Negative    2. Soybean Negative    3. Wheat Negative    4. Sesame Negative    5. Milk, Cow Negative    6. Casein Negative    7. Egg White, Chicken Negative    8. Shellfish Mix Negative    9. Fish Mix Negative    10. Cashew Negative    11. Walnut Food Negative    12. Almond Negative    13. Hazelnut Negative          Intradermal - 04/23/24 0926     Time Antigen Placed 9076    Allergen Manufacturer Jestine    Location Arm    Number of Test 13    Control Negative    Bahia 3+    Bermuda 2+    Johnson Negative    7 Grass 3+    Ragweed Mix Negative    Tree  Mix Negative    Mold 1 Negative    Mold 2 Negative    Mold 3 Negative    Mold 4 Negative    Dog Negative    Cockroach Negative           Assessment:   1. Seasonal allergic rhinitis due to pollen   2. Upper respiratory tract hypersensitivity reaction   3. Allergic rhinitis due to animal hair or dander   4. Allergic rhinitis due to dust mite     Plan/Recommendations:  Food Reactions - No food restrictions in terms of allergy. Okay to continue avoidance of gluten as it worsens your GI symptoms.   - Initial rxn: stomach pain and also congestion, drainage with foods  - 03/2024: negative alpha gal - 03/2024: SPT negative to commonly allergenic foods  - Follow up with GI.  Consider low FODMAP diet.    Allergic Rhinitis: - Due to turbinate hypertrophy, seasonal symptoms and unresponsive to over the counter meds, will perform skin testing to identify aeroallergen triggers.  - Positive skin test 03/2024: grasses, weeds, dust mites, cats - Avoidance measures discussed.  - Use nasal saline rinses before nose sprays such as with Neilmed Sinus Rinse.  Use distilled water .   - Use Azelastine 1-2 sprays each nostril twice daily as needed for runny nose, drainage, sneezing, congestion. Aim upward and outward. - Use Zyrtec 10 mg daily as needed for runny nose, sneezing, itchy watery eyes. - Consider allergy shots as long term control of your symptoms by teaching your immune system to be more tolerant of your allergy triggers.      ALLERGEN AVOIDANCE MEASURES   Dust Mites Use central air conditioning and heat; and change the filter monthly.  Pleated filters work better than mesh filters.  Electrostatic filters may also be used; wash the filter monthly.  Window air conditioners may be used, but do not clean the air as well as a central air conditioner.  Change or wash the filter monthly. Keep windows closed.  Do not use attic fans.   Encase the mattress, box springs and pillows with  zippered, dust proof covers. Wash the bed linens in hot water  weekly.   Remove carpet, especially from the bedroom. Remove stuffed animals, throw pillows, dust ruffles, heavy drapes and other items that collect dust from the bedroom. Do not use a humidifier.   Use wood, vinyl or leather furniture instead of cloth furniture in the bedroom. Keep the indoor humidity at 30 - 40%.   Pollen Avoidance Pollen levels are highest during the mid-day and afternoon.  Consider this when planning outdoor activities. Avoid being outside when the grass is being mowed, or wear a mask if the pollen-allergic person must be the one to mow the grass. Keep the windows closed to keep pollen outside of the home. Use an air conditioner to filter the air. Take a shower, wash hair, and change clothing after working or playing outdoors during pollen season. Pet Dander Keep the pet out of your bedroom and restrict it to only a few rooms. Be advised that keeping the pet in only one room will not limit the allergens to that room. Don't pet, hug or kiss the pet; if you do, wash your hands with soap and water . High-efficiency particulate air (HEPA) cleaners run continuously in a bedroom or living room can reduce allergen levels over time. Regular use of a high-efficiency vacuum cleaner or a central vacuum can reduce allergen levels. Giving your pet a bath at least once a week can reduce airborne allergen.    Return in about 6 months (around 10/21/2024).  Arleta Blanch, MD [facility] 

## 2024-04-23 NOTE — Patient Instructions (Addendum)
 Food Reactions - 03/2024: negative alpha gal - 03/2024: SPT negative to commonly allergenic foods  - Follow up with GI.  Consider low FODMAP diet.    Allergic Rhinitis: - Positive skin test 03/2024: grasses, weeds, dust mites, cats - Use nasal saline rinses before nose sprays such as with Neilmed Sinus Rinse.  Use distilled water .   - Use Azelastine 1-2 sprays each nostril twice daily as needed for runny nose, drainage, sneezing, congestion. Aim upward and outward. - Use Zyrtec 10 mg daily as needed for runny nose, sneezing, itchy watery eyes. - Consider allergy shots as long term control of your symptoms by teaching your immune system to be more tolerant of your allergy triggers.      ALLERGEN AVOIDANCE MEASURES   Dust Mites Use central air conditioning and heat; and change the filter monthly.  Pleated filters work better than mesh filters.  Electrostatic filters may also be used; wash the filter monthly.  Window air conditioners may be used, but do not clean the air as well as a central air conditioner.  Change or wash the filter monthly. Keep windows closed.  Do not use attic fans.   Encase the mattress, box springs and pillows with zippered, dust proof covers. Wash the bed linens in hot water  weekly.   Remove carpet, especially from the bedroom. Remove stuffed animals, throw pillows, dust ruffles, heavy drapes and other items that collect dust from the bedroom. Do not use a humidifier.   Use wood, vinyl or leather furniture instead of cloth furniture in the bedroom. Keep the indoor humidity at 30 - 40%.   Pollen Avoidance Pollen levels are highest during the mid-day and afternoon.  Consider this when planning outdoor activities. Avoid being outside when the grass is being mowed, or wear a mask if the pollen-allergic person must be the one to mow the grass. Keep the windows closed to keep pollen outside of the home. Use an air conditioner to filter the air. Take a shower, wash  hair, and change clothing after working or playing outdoors during pollen season. Pet Dander Keep the pet out of your bedroom and restrict it to only a few rooms. Be advised that keeping the pet in only one room will not limit the allergens to that room. Don't pet, hug or kiss the pet; if you do, wash your hands with soap and water . High-efficiency particulate air (HEPA) cleaners run continuously in a bedroom or living room can reduce allergen levels over time. Regular use of a high-efficiency vacuum cleaner or a central vacuum can reduce allergen levels. Giving your pet a bath at least once a week can reduce airborne allergen.

## 2024-05-28 ENCOUNTER — Other Ambulatory Visit (HOSPITAL_COMMUNITY): Payer: Self-pay

## 2024-05-28 MED ORDER — LISDEXAMFETAMINE DIMESYLATE 40 MG PO CHEW
1.0000 | CHEWABLE_TABLET | Freq: Every morning | ORAL | 0 refills | Status: AC
  Filled 2024-05-28: qty 30, 30d supply, fill #0

## 2024-06-01 ENCOUNTER — Other Ambulatory Visit (HOSPITAL_COMMUNITY): Payer: Self-pay

## 2024-06-01 MED ORDER — DIAZEPAM 2 MG PO TABS
2.0000 mg | ORAL_TABLET | ORAL | 0 refills | Status: AC | PRN
  Filled 2024-06-01: qty 5, 5d supply, fill #0

## 2024-06-01 MED ORDER — HYDROXYZINE HCL 25 MG PO TABS
12.5000 mg | ORAL_TABLET | Freq: Two times a day (BID) | ORAL | 3 refills | Status: AC
  Filled 2024-06-01 (×2): qty 60, 30d supply, fill #0

## 2024-06-07 ENCOUNTER — Other Ambulatory Visit (HOSPITAL_COMMUNITY): Payer: Self-pay

## 2024-10-22 ENCOUNTER — Ambulatory Visit: Admitting: Internal Medicine
